# Patient Record
Sex: Male | Born: 1945
Health system: Southern US, Community
[De-identification: ages and names within clinical notes are randomized; demographics above are authoritative.]

## PROBLEM LIST (undated history)

## (undated) DIAGNOSIS — Z8701 Personal history of pneumonia (recurrent): Secondary | ICD-10-CM

## (undated) DIAGNOSIS — I10 Essential (primary) hypertension: Secondary | ICD-10-CM

## (undated) DIAGNOSIS — E785 Hyperlipidemia, unspecified: Secondary | ICD-10-CM

## (undated) DIAGNOSIS — I214 Non-ST elevation (NSTEMI) myocardial infarction: Secondary | ICD-10-CM

## (undated) DIAGNOSIS — E119 Type 2 diabetes mellitus without complications: Secondary | ICD-10-CM

## (undated) DIAGNOSIS — Z87442 Personal history of urinary calculi: Secondary | ICD-10-CM

## (undated) DIAGNOSIS — I2510 Atherosclerotic heart disease of native coronary artery without angina pectoris: Secondary | ICD-10-CM

## (undated) DIAGNOSIS — I219 Acute myocardial infarction, unspecified: Secondary | ICD-10-CM

## (undated) DIAGNOSIS — J189 Pneumonia, unspecified organism: Secondary | ICD-10-CM

## (undated) DIAGNOSIS — K219 Gastro-esophageal reflux disease without esophagitis: Secondary | ICD-10-CM

## (undated) DIAGNOSIS — Z862 Personal history of diseases of the blood and blood-forming organs and certain disorders involving the immune mechanism: Secondary | ICD-10-CM

## (undated) DIAGNOSIS — M1990 Unspecified osteoarthritis, unspecified site: Secondary | ICD-10-CM

## (undated) HISTORY — DX: Personal history of pneumonia (recurrent): Z87.01

## (undated) HISTORY — DX: Type 2 diabetes mellitus without complications: E11.9

## (undated) HISTORY — DX: Essential (primary) hypertension: I10

## (undated) HISTORY — DX: Hyperlipidemia, unspecified: E78.5

## (undated) HISTORY — DX: Atherosclerotic heart disease of native coronary artery without angina pectoris: I25.10

---
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
## 2008-09-20 DIAGNOSIS — I214 Non-ST elevation (NSTEMI) myocardial infarction: Secondary | ICD-10-CM

## 2008-09-20 HISTORY — DX: Non-ST elevation (NSTEMI) myocardial infarction: I21.4

## 2008-09-20 HISTORY — PX: CORONARY ARTERY BYPASS GRAFT: SHX141

## 2009-02-01 ENCOUNTER — Encounter: Payer: Self-pay | Admitting: Cardiology

## 2009-02-01 ENCOUNTER — Ambulatory Visit: Payer: Self-pay | Admitting: *Deleted

## 2009-02-01 ENCOUNTER — Inpatient Hospital Stay (HOSPITAL_COMMUNITY): Admission: EM | Admit: 2009-02-01 | Discharge: 2009-02-07 | Payer: Self-pay | Admitting: *Deleted

## 2009-02-02 ENCOUNTER — Encounter (INDEPENDENT_AMBULATORY_CARE_PROVIDER_SITE_OTHER): Payer: Self-pay | Admitting: *Deleted

## 2009-02-03 ENCOUNTER — Ambulatory Visit: Payer: Self-pay | Admitting: Surgery

## 2009-02-25 ENCOUNTER — Ambulatory Visit: Payer: Self-pay | Admitting: Surgery

## 2009-02-25 ENCOUNTER — Encounter: Admission: RE | Admit: 2009-02-25 | Discharge: 2009-02-25 | Payer: Self-pay | Admitting: Surgery

## 2009-03-03 DIAGNOSIS — M1990 Unspecified osteoarthritis, unspecified site: Secondary | ICD-10-CM | POA: Insufficient documentation

## 2009-03-03 DIAGNOSIS — I2510 Atherosclerotic heart disease of native coronary artery without angina pectoris: Secondary | ICD-10-CM | POA: Insufficient documentation

## 2009-03-03 DIAGNOSIS — F17200 Nicotine dependence, unspecified, uncomplicated: Secondary | ICD-10-CM | POA: Insufficient documentation

## 2009-03-06 ENCOUNTER — Ambulatory Visit: Payer: Self-pay | Admitting: Cardiology

## 2009-03-06 DIAGNOSIS — E785 Hyperlipidemia, unspecified: Secondary | ICD-10-CM | POA: Insufficient documentation

## 2009-03-06 DIAGNOSIS — I10 Essential (primary) hypertension: Secondary | ICD-10-CM | POA: Insufficient documentation

## 2009-03-21 ENCOUNTER — Encounter: Payer: Self-pay | Admitting: Cardiology

## 2009-03-26 ENCOUNTER — Telehealth: Payer: Self-pay | Admitting: Cardiology

## 2009-03-31 ENCOUNTER — Telehealth: Payer: Self-pay | Admitting: Cardiology

## 2009-04-02 ENCOUNTER — Ambulatory Visit: Payer: Self-pay | Admitting: Cardiology

## 2009-04-17 ENCOUNTER — Telehealth: Payer: Self-pay | Admitting: Cardiology

## 2009-04-30 ENCOUNTER — Encounter: Payer: Self-pay | Admitting: Cardiology

## 2009-05-21 ENCOUNTER — Telehealth: Payer: Self-pay | Admitting: Cardiology

## 2009-05-30 ENCOUNTER — Ambulatory Visit: Payer: Self-pay | Admitting: Cardiology

## 2009-06-09 ENCOUNTER — Telehealth: Payer: Self-pay | Admitting: Cardiology

## 2009-06-13 ENCOUNTER — Encounter: Payer: Self-pay | Admitting: Cardiology

## 2009-10-16 ENCOUNTER — Encounter (INDEPENDENT_AMBULATORY_CARE_PROVIDER_SITE_OTHER): Payer: Self-pay | Admitting: *Deleted

## 2009-11-21 ENCOUNTER — Ambulatory Visit: Payer: Self-pay | Admitting: Cardiology

## 2009-12-19 ENCOUNTER — Encounter: Payer: Self-pay | Admitting: Cardiology

## 2010-10-20 NOTE — Assessment & Plan Note (Signed)
Summary: PER CHECK OUT/SF   Primary Provider:  None  CC:  follow up/  Pt has no cardiac concerns at this time.  History of Present Illness: 65 yo with history CAD s/p NSTEMI and CABG returns for followup.  Patient was admitted with NSTEMI in 5/10.  He was noted on cath to have totally occluded RCA with collaterals from the left and 95% proximal LAD at diagonal bifurcation.  He developed chest pain in recovery and was sent for emergent CABG.  He had an uneventful recovery in the hospital after CABG and was discharged home.  He is back at work full time and is active at work.    Patient has been doing well from a cardiovascular standpoint.   He does not get exertional chest pain or shortness of breath.  He does a lot of walking on his job.  He is staying off cigarettes.  Systolic BP at home has been running 120s-130s.   Current Medications (verified): 1)  Aspirin 325 Mg Tabs (Aspirin) .... Once Daily 2)  Metoprolol Tartrate 25 Mg Tabs (Metoprolol Tartrate) .... (1/2) Two Times A Day 3)  Pravachol 40 Mg Tabs (Pravastatin Sodium) .... Take 1 Tablet Two Times A Day 4)  Lisinopril 20 Mg Tabs (Lisinopril) .... Take One Tablet Two Times A Day 5)  Viagra 50 Mg Tabs (Sildenafil Citrate) .... As Directed 6)  Hydrochlorothiazide 12.5 Mg Caps (Hydrochlorothiazide) .... Take One Capsule Once Daily  Allergies (verified): 1)  ! Pcn  Past History:  Past Medical History: Reviewed history from 05/30/2009 and no changes required. 1.  CAD:  NSTEMI 5/10, LHC showed totally occluded RCA with collaterals from the left, 95% LAD stenosis at D1 bifurcation.  Patient had CABG with LIMA-LAD, SVG-D, and seq SVG-PDA and PLV.  2.  Echo (5/10): EF 60-65%, moderate LVH, no significant valvular abnormalities.  3.  Prior smoker (quit 5/10) 4.  Osteoarthritis 5.  HTN 6.  Hyperlipidemia: myalgias with Crestor and Zocor  Family History: Reviewed history from 03/03/2009 and no changes required. His mother is living at  94 years old His father had a myocardial infarction at age 58 but died at age 34 He has a sister who  is well.   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 Married-3 children Tobacco Use - Prior. 1 to 2 ppd, quit 5/10.  Alcohol Use - yes-occasional, mainly on weekends Drug Use - no  Review of Systems       All systems reviewed and negative except as per HPI.   Vital Signs:  Patient profile:   65 year old male Height:      69 inches Weight:      198 pounds BMI:     29.35 Pulse rate:   53 / minute Pulse rhythm:   regular BP sitting:   130 / 78  (left arm) Cuff size:   large  Vitals Entered By: Judithe Modest CMA (November 21, 2009 1:33 PM)  Physical Exam  General:  Well developed, well nourished, in no acute distress. Neck:  Neck supple, no JVD. No masses, thyromegaly or abnormal cervical nodes. Lungs:  Clear bilaterally to auscultation and percussion. Heart:  Non-displaced PMI, chest non-tender; regular rate and rhythm, S1, S2 without murmurs, rubs or gallops. Carotid upstroke normal, no bruit. Pedals normal pulses. No edema, no varicosities. Abdomen:  Bowel sounds positive; abdomen soft and non-tender without masses, organomegaly, or hernias noted. No hepatosplenomegaly. Extremities:  No clubbing  or cyanosis. Neurologic:  Alert and oriented x 3. Psych:  Normal affect.   Impression & Recommendations:  Problem # 1:  CAD (ICD-414.00) Doing well post-CABG with no chest pain.  Continue current meds: ASA, lopressor, ACEI, statin.  Continue to try to walk for exercise.   Problem # 2:  HYPERTENSION, UNSPECIFIED (ICD-401.9) BP is at goal.   Problem # 3:  HYPERLIPIDEMIA-MIXED (ICD-272.4) Check lipids/LFTs today, goal LDL < 70.   Other Orders: EKG w/ Interpretation (93000)  Patient Instructions: 1)  Your physician recommends that you have  a FASTING lipid profile/liver profile/BMP --you have the order to have this done at  the [facility] ask them to fax the results to Dr Shirlee Latch [phone]  2)  Your physician wants you to follow-up in: 6 months with Dr Shirlee Latch.  You will receive a reminder letter in the mail two months in advance. If you don't receive a letter, please call our office to schedule the follow-up appointment.

## 2010-10-20 NOTE — Letter (Signed)
Summary: Appointment - Reminder 2  [facility], Main Office  [address]   [place], [place] [address]   Phone: [phone]  Fax: [phone]     October 16, 2009 MRN: [id number]   ANITA MCADORY [address] [facility] [address], [place]  [address]   Dear Mr. ASHWORTH,  Our records indicate that it is time to schedule a follow-up appointment with Dr. Shirlee Latch. It is very important that we reach you to schedule this appointment. We look forward to participating in your health care needs. Please contact us at the number listed above at your earliest convenience to schedule your appointment.  If you are unable to make an appointment at this time, give [place] a call so we can update our records.     Sincerely,   Glass blower/designer

## 2010-12-29 LAB — POCT I-STAT 4, (NA,K, GLUC, HGB,HCT)
Glucose, Bld: 84 mg/dL (ref 70–99)
Glucose, Bld: 87 mg/dL (ref 70–99)
Glucose, Bld: 93 mg/dL (ref 70–99)
HCT: 23 % — ABNORMAL LOW (ref 39.0–52.0)
HCT: 27 % — ABNORMAL LOW (ref 39.0–52.0)
HCT: 28 % — ABNORMAL LOW (ref 39.0–52.0)
HCT: 33 % — ABNORMAL LOW (ref 39.0–52.0)
Hemoglobin: 11.2 g/dL — ABNORMAL LOW (ref 13.0–17.0)
Hemoglobin: 11.9 g/dL — ABNORMAL LOW (ref 13.0–17.0)
Hemoglobin: 12.6 g/dL — ABNORMAL LOW (ref 13.0–17.0)
Hemoglobin: 7.8 g/dL — CL (ref 13.0–17.0)
Hemoglobin: 9.2 g/dL — ABNORMAL LOW (ref 13.0–17.0)
Potassium: 3.6 mEq/L (ref 3.5–5.1)
Potassium: 4.1 mEq/L (ref 3.5–5.1)
Potassium: 4.6 mEq/L (ref 3.5–5.1)
Potassium: 5 mEq/L (ref 3.5–5.1)
Sodium: 135 mEq/L (ref 135–145)
Sodium: 138 mEq/L (ref 135–145)
Sodium: 139 mEq/L (ref 135–145)
Sodium: 140 mEq/L (ref 135–145)

## 2010-12-29 LAB — CBC
HCT: 30.2 % — ABNORMAL LOW (ref 39.0–52.0)
HCT: 30.8 % — ABNORMAL LOW (ref 39.0–52.0)
Hemoglobin: 10.6 g/dL — ABNORMAL LOW (ref 13.0–17.0)
Hemoglobin: 10.7 g/dL — ABNORMAL LOW (ref 13.0–17.0)
Hemoglobin: 10.8 g/dL — ABNORMAL LOW (ref 13.0–17.0)
Hemoglobin: 11.2 g/dL — ABNORMAL LOW (ref 13.0–17.0)
Hemoglobin: 13.2 g/dL (ref 13.0–17.0)
MCHC: 34.4 g/dL (ref 30.0–36.0)
MCHC: 34.8 g/dL (ref 30.0–36.0)
MCHC: 34.8 g/dL (ref 30.0–36.0)
MCV: 96.7 fL (ref 78.0–100.0)
Platelets: 105 10*3/uL — ABNORMAL LOW (ref 150–400)
Platelets: 185 10*3/uL (ref 150–400)
RBC: 3.15 MIL/uL — ABNORMAL LOW (ref 4.22–5.81)
RBC: 3.16 MIL/uL — ABNORMAL LOW (ref 4.22–5.81)
RBC: 3.18 MIL/uL — ABNORMAL LOW (ref 4.22–5.81)
RBC: 3.34 MIL/uL — ABNORMAL LOW (ref 4.22–5.81)
RBC: 3.96 MIL/uL — ABNORMAL LOW (ref 4.22–5.81)
RDW: 12.9 % (ref 11.5–15.5)
RDW: 13.1 % (ref 11.5–15.5)
RDW: 13.1 % (ref 11.5–15.5)
RDW: 13.4 % (ref 11.5–15.5)
WBC: 11.2 10*3/uL — ABNORMAL HIGH (ref 4.0–10.5)
WBC: 8.5 10*3/uL (ref 4.0–10.5)
WBC: 9.1 10*3/uL (ref 4.0–10.5)
WBC: 9.5 10*3/uL (ref 4.0–10.5)

## 2010-12-29 LAB — POCT I-STAT, CHEM 8
Creatinine, Ser: 1.1 mg/dL (ref 0.4–1.5)
Glucose, Bld: 112 mg/dL — ABNORMAL HIGH (ref 70–99)
Hemoglobin: 10.5 g/dL — ABNORMAL LOW (ref 13.0–17.0)
Potassium: 3.9 mEq/L (ref 3.5–5.1)

## 2010-12-29 LAB — BASIC METABOLIC PANEL
BUN: 10 mg/dL (ref 6–23)
BUN: 10 mg/dL (ref 6–23)
CO2: 25 mEq/L (ref 19–32)
CO2: 29 mEq/L (ref 19–32)
Calcium: 7.8 mg/dL — ABNORMAL LOW (ref 8.4–10.5)
Calcium: 8.7 mg/dL (ref 8.4–10.5)
Chloride: 108 mEq/L (ref 96–112)
Chloride: 109 mEq/L (ref 96–112)
GFR calc Af Amer: >60 mL/min (ref >60)
GFR calc Af Amer: >60 mL/min (ref >60)
GFR calc non Af Amer: >60 mL/min (ref >60)
GFR calc non Af Amer: >60 mL/min (ref >60)
GFR calc non Af Amer: >60 mL/min (ref >60)
Glucose, Bld: 101 mg/dL — ABNORMAL HIGH (ref 70–99)
Glucose, Bld: 122 mg/dL — ABNORMAL HIGH (ref 70–99)
Glucose, Bld: 169 mg/dL — ABNORMAL HIGH (ref 70–99)
Potassium: 3.6 mEq/L (ref 3.5–5.1)
Potassium: 4 mEq/L (ref 3.5–5.1)
Potassium: 4.1 mEq/L (ref 3.5–5.1)
Potassium: 4.3 mEq/L (ref 3.5–5.1)
Sodium: 138 mEq/L (ref 135–145)
Sodium: 140 mEq/L (ref 135–145)
Sodium: 141 mEq/L (ref 135–145)
Sodium: 141 mEq/L (ref 135–145)

## 2010-12-29 LAB — CARDIAC PANEL(CRET KIN+CKTOT+MB+TROPI)
CK, MB: 98 ng/mL — ABNORMAL HIGH (ref 0.3–4.0)
CK, MB: 67.1 ng/mL — ABNORMAL HIGH (ref 0.3–4.0)
Relative Index: 14.4 — ABNORMAL HIGH (ref 0.0–2.5)
Total CK: 679 U/L — ABNORMAL HIGH (ref 7–232)
Total CK: 508 U/L — ABNORMAL HIGH (ref 7–232)
Troponin I: 3.64 ng/mL (ref 0.00–0.06)

## 2010-12-29 LAB — LIPID PANEL
HDL: 55 mg/dL (ref >39)
LDL Cholesterol: 126 mg/dL — ABNORMAL HIGH (ref 0–99)
Triglycerides: 76 mg/dL (ref <150)
VLDL: 15 mg/dL (ref 0–40)

## 2010-12-29 LAB — POCT I-STAT 7, (LYTES, BLD GAS, ICA,H+H)
Acid-base deficit: 2 mmol/L (ref 0.0–2.0)
Bicarbonate: 24 mEq/L (ref 20.0–24.0)
Sodium: 140 mEq/L (ref 135–145)
TCO2: 25 mmol/L (ref 0–100)

## 2010-12-29 LAB — HEMOGLOBIN A1C
Hgb A1c MFr Bld: 5.3 % (ref 4.6–6.1)
Mean Plasma Glucose: 105 mg/dL

## 2010-12-29 LAB — CROSSMATCH: ABO/RH(D): O POS

## 2010-12-29 LAB — POCT I-STAT 3, ART BLOOD GAS (G3+)
Acid-Base Excess: 1 mmol/L (ref 0.0–2.0)
O2 Saturation: 94 %
TCO2: 26 mmol/L (ref 0–100)
pCO2 arterial: 34.6 mmHg — ABNORMAL LOW (ref 35.0–45.0)
pCO2 arterial: 40.3 mmHg (ref 35.0–45.0)
pH, Arterial: 7.416 (ref 7.350–7.450)
pO2, Arterial: 286 mmHg — ABNORMAL HIGH (ref 80.0–100.0)

## 2010-12-29 LAB — ABO/RH: ABO/RH(D): O POS

## 2010-12-29 LAB — APTT
aPTT: 110 seconds — ABNORMAL HIGH (ref 24–37)
aPTT: 33 seconds (ref 24–37)
aPTT: 36 seconds (ref 24–37)
aPTT: 39 seconds — ABNORMAL HIGH (ref 24–37)

## 2010-12-29 LAB — GLUCOSE, CAPILLARY
Glucose-Capillary: 104 mg/dL — ABNORMAL HIGH (ref 70–99)
Glucose-Capillary: 116 mg/dL — ABNORMAL HIGH (ref 70–99)
Glucose-Capillary: 118 mg/dL — ABNORMAL HIGH (ref 70–99)
Glucose-Capillary: 87 mg/dL (ref 70–99)
Glucose-Capillary: 93 mg/dL (ref 70–99)
Glucose-Capillary: 97 mg/dL (ref 70–99)

## 2010-12-29 LAB — HEPARIN LEVEL (UNFRACTIONATED)
Heparin Unfractionated: 0.48 IU/mL (ref 0.30–0.70)
Heparin Unfractionated: 0.58 IU/mL (ref 0.30–0.70)
Heparin Unfractionated: <0.1 IU/mL — ABNORMAL LOW (ref 0.30–0.70)

## 2010-12-29 LAB — HEMOGLOBIN AND HEMATOCRIT, BLOOD: Hemoglobin: 9.3 g/dL — ABNORMAL LOW (ref 13.0–17.0)

## 2010-12-29 LAB — CREATININE, SERUM: Creatinine, Ser: 0.86 mg/dL (ref 0.4–1.5)

## 2010-12-29 LAB — PROTIME-INR
INR: 1 (ref 0.00–1.49)
Prothrombin Time: 13.5 seconds (ref 11.6–15.2)
Prothrombin Time: 16.3 seconds — ABNORMAL HIGH (ref 11.6–15.2)
Prothrombin Time: 17.4 seconds — ABNORMAL HIGH (ref 11.6–15.2)

## 2010-12-29 LAB — MAGNESIUM
Magnesium: 2.2 mg/dL (ref 1.5–2.5)
Magnesium: 2.3 mg/dL (ref 1.5–2.5)
Magnesium: 2.4 mg/dL (ref 1.5–2.5)

## 2011-02-02 NOTE — H&P (Signed)
NAMEJIBRI, Derek [name] NO.:  [id number]   MEDICAL RECORD NO.:  [id number]          PATIENT TYPE:  INP   LOCATION:  2915                         FACILITY:  [facility]   PHYSICIAN:  Adela Ports, MD   DATE OF BIRTH:  [birth year]   DATE OF ADMISSION:  02/01/2009  DATE OF DISCHARGE:                              HISTORY & PHYSICAL   PRIMARY MEDICAL DOCTOR:  None.   PRIMARY CARDIOLOGIST:  None.   CHIEF COMPLAINT:  Chest pain.   HISTORY OF PRESENT ILLNESS:  Derek [name] is a 65 year old male with no  significant past medical history and no current medical followup.  He is  an active smoker.   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  He found that while  working on a car doing some fairly active work he had episodes of chest  tightness that was relieved with rest 3 times.  He described these as a  feeling of pressure in the substernal chest that was moderate in  severity.  He went home and relaxed and went to bed and woke at about 10  p.m. with recurrence of his chest pain that was more severe and lasted  to approximately 3 a.m.  At that time he took some Pepto-Bismol and  symptoms resolved after about 30 minutes.  He went back to sleep and  then this morning when he woke up he got breakfast and subsequently had  recurrence of his chest pain again.  This was substernal chest pain with  radiation to the left shoulder and mild radiation to the right shoulder  described as a squeezing sensation that was 8/10 in severity.  After  approximately 3 hours of this, he had his wife check his blood pressure  and the systolic pressure was 224.  [facility] the [facility] and they arrived and checked an EKG, told him that it was not  too concerning, but that he should probably go to the hospital.  He  opted to wait, but when the pain continued he had his wife drive him to  the hospital  at [facility].  He denies any headache, dizziness, nausea,  vomiting, claudication, cough, fevers, chills, polyuria, polydipsia,  weight change, orthopnea, PND, edema, calf redness or swelling, or  hemoptysis.   At [facility] his blood pressure was  210/100.  He was treated with nitroglycerin and morphine with some  relief of his pain.  D-dimer was positive, so he had a chest CT that was  negative for pulmonary embolism.  Troponin came back at 0.25, with upper  limit of normal being 0.03.  He was started on heparin and transferred  to [facility] for admission and further evaluation.   PAST MEDICAL HISTORY:  Osteoarthritis and history of pilonidal cyst  removal as a teenager.   SOCIAL HISTORY:  The patient lives with his wife and works as an Psychologist, prison and probation services.  He has 3 children.  He smokes 1-2 packs per day and has done  so for many years, though he has also quit for extended periods in the  past.  He drinks alcohol occasionally on weekends but does not use  drugs.   FAMILY HISTORY:  His mother is living at 47 years old.  His father had a  myocardial infarction at age 2 but died at age 35.  He has a sister who  is well.   REVIEW OF SYSTEMS:  He did have a flu-like illness approximately 3-4  weeks ago that was self limited.  He denies any other findings other  than mentioned above.  He is full code.   ALLERGIES:  PENICILLIN caused a questionable reaction as a child.   MEDICATIONS:  Aspirin 325 mg daily, ibuprofen as needed for arthritis  pain.   PHYSICAL EXAMINATION:  Temperature 98.5, pulse 64, blood pressure was  163/84, respiratory rate 19.  Weight is approximately 180 pounds, height  5 feet 9.  Oxygen saturation 100% on 4 liters.  GENERAL:  This is a pleasant well-appearing man in no acute distress.  HEENT:  Normocephalic, atraumatic.  Pupils equal, round and reactive.  Extraocular movements intact.  Mucous membranes are moist.  NECK:  Supple without  lymphadenopathy or thyromegaly.  Carotids are 2+  without bruits.  Jugular venous pressure is 7 cm.  CARDIOVASCULAR EXAM:  There is a regular rate and rhythm with normal S1-  S2, no S3 or S4 gallops are appreciated.  There is a very soft 1/6  systolic ejection murmur and a very soft 1/6 systolic murmur at the  apex.  Pulses are 2+ in the femoral and DP and PT positions bilaterally  without bruits.  LUNGS:  Clear to auscultation bilaterally.  SKIN:  No rash or lesions, though there are some changes consistent with  chronic sun exposure on the upper back.  ABDOMEN:  Soft, nontender, nondistended without rebound or guarding.  Positive bowel sounds.  No hepatosplenomegaly.  EXTREMITIES:  No clubbing, cyanosis or edema.  MUSCULOSKELETAL:  Grossly normal.  NEURO:  Grossly normal.   RADIOLOGY:  CT of the chest at outside hospital showed no evidence of  pulmonary embolism.  EKG at 3:46 p.m. at outside hospital showed sinus  rhythm at 80 beats per minute with PVCs, with voltage consistent with  LVH and lateral ST depressions consistent with repolarization.  At that  time blood pressure was 210/100 and chest pain was 5/10.  Second EKG at  5:09 p.m. showed sinus rhythm at 61 beats per minute with normalization  of his voltage and normalization of lateral ST-T changes with persistent  nonspecific ST-T changes in the inferior leads.  At that time blood  pressure was lower at 139/78 and he was chest pain free.   LABORATORIES:  Notable for a white count of 12.8, hematocrit 47.6,  platelets 208.  Potassium is 4.2, creatinine 0.85, glucose 129.  D-dimer  was 0.57, INR 0.9, CK 103 with MB fraction 8.3, troponin-I was 0.25 on  the first set.   ASSESSMENT AND PLAN:  Derek [name] presents with chest pain and positive  troponin consistent with non-ST elevation myocardial infarction.  This  could be in the setting of an acute coronary syndrome versus  hypertensive emergency or both.  He is currently pain  free on  nitroglycerin and heparin after receiving morphine and sublingual  nitroglycerin as well.   PLAN:  Will be admission for further observation and management.  We  will cycle his cardiac enzymes.  We will continue treatment with heparin  and load Plavix.  We will continue aspirin.  Will initiate beta blocker  and ACE inhibitor therapy.  We will start a high-dose statin.  We will  continue nitroglycerin drip.  We will check a lipid panel and hemoglobin  A1c and TSH.  We will also obtain an echocardiogram.  Blood pressure  goal will be less than 160 systolic for now, given his very high  presenting blood pressure, with a goal of systolic 120 tomorrow.  We  will plan  for cardiac catheterization on Monday for further evaluation and  possible therapy.  We have discussed tobacco cessation and Mr. Fancher  agrees that he is now a former smoker.  He says that he has quit in the  past and feels he can do this without pharmacologic assistance, though  he knows that this is available if it would help.      Adela Ports, MD  Electronically Signed     Adela Ports, MD  Electronically Signed    DWM/MEDQ  D:  02/01/2009  T:  02/01/2009  Job:  [id number]

## 2011-02-02 NOTE — Discharge Summary (Signed)
Derek [name], Derek [name]             ACCOUNT NO.:  [id number]   MEDICAL RECORD NO.:  [id number]          PATIENT TYPE:  INP   LOCATION:  2008                         FACILITY:  [facility]   PHYSICIAN:  Evelene Croon, M.D.     DATE OF BIRTH:  03-Jul-1946   DATE OF ADMISSION:  02/01/2009  DATE OF DISCHARGE:  02/07/2009                               DISCHARGE SUMMARY   ADMITTING DIAGNOSES:  1. Non-ST elevation myocardial infarction.  2. Tobacco abuse.   DISCHARGE DIAGNOSES:  1. Non-ST elevation myocardial infarction.  2. Tobacco abuse.  3. Postoperative thrombocytopenia.   PROCEDURES:  1. Echocardiogram done Feb 02, 2009, results revealed no significant      valvular disease.  Estimated ejection fraction was 60-65%.  2. Cardiac catheterization performed on Feb 03, 2009 by Dr. Excell Seltzer.  3. Emergent coronary artery bypass graft x4 (left internal mammary      artery to left anterior descending, saphenous vein graft to      diagonal branch, saphenous vein graft to posterior descending      artery and posterolateral branches of the right coronary artery      with endoscopic vein harvesting of the right leg by Dr. Laneta Simmers on      Feb 03, 2009).   HISTORY OF PRESENT ILLNESS:  This is a 65 year old Caucasian male with  history of tobacco abuse who had no prior history of coronary artery  disease.  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  While he was doing so strenuous work  on a car, he had several episodes of chest discomfort.  This chest  discomfort happened on three different occasions and each time was  relieved with rest.  He then went home and relaxed.  He was awakened at  10:00 p.m. with recurrence of chest pain that was more severe than the  previous episodes.  The chest pain lasted until about 3:00 a.m.  He took  some antacids and this chest pain apparently resolved after  approximately 30 minutes.  He then awoke the morning of Feb 01, 2009 and  did not have any chest pain until after he was done eating breakfast.  The chest pain then radiated to his left shoulder as well as some mild  radiation to his right shoulder.  The patient's wife took his blood  pressure which apparently was very elevated.  EMS was called and an EKG  apparently did not show any worrisome ischemic changes.  The patient  decided not to go to the hospital, however, later his chest pain  recurred and his wife took him to [facility] for further  evaluation and treatment.  EKG done in the emergency room showed some  inferior ST and T-wave changes, his troponin level was 0.25, CPK 103,  and CPK-MB of 8.3.  The patient was found to have an elevated D-dimer  level during workup and a CT scan of the chest was negative for  pulmonary embolism.  The patient was then transferred to [facility] for  further  workup regarding non-STEMI.  He was loaded with Plavix and  started on aspirin and beta-blockers.  Cardiac catheterization was done  on Feb 03, 2009 by Dr. Excell Seltzer which showed an occluded proximal RCA with  filling of the distal vessel by collaterals from the LAD.  The LAD and  diagonal had 95% bifurcation stenoses.  The left circumflex had an  approximate 50% proximal stenosis and the EF was 45-50% with inferior  wall akinesis.  The patient did have some mild substernal chest pain  following the cardiac catheterization.  He was given morphine with  resolution of his chest pain.  A consultation was obtained with Dr.  Laneta Simmers and it was decided the patient needed to undergo emergent CABG  which was performed on Feb 03, 2009.   HOSPITAL COURSE STAY:  The patient was extubated late the evening of  surgery.  He remained afebrile and hemodynamically stable.  A-line Swan  and chest tubes were removed on postoperative day #1.  He was still on  phenylephrine, this was weaned.  He was also found to have postoperative  thrombocytopenia.  This was monitored  and his last platelet count was  120,000 on Feb 05, 2009.  The patient was found to be volume overloaded  and diuresed accordingly.  He was transferred from the Intensive Care  Unit to PCTU for further convalescence.  He continued to progress with  cardiac rehab.  Currently on postop day #3, he is tolerating a diet.  He  has had a bowel movement.  He is afebrile.  Vital signs are stable.  His  O2 sat is 91% on room air.  His preop weight is 82 kg, today's weight is  down to 87.6 kg.  On physical exam, cardiovascular has regular rate and  rhythm.  Lungs are clear to auscultation bilaterally.  Extremities,  middle lower extremity edema.  Incisions are healing well.  Provided the  patient remained afebrile and hemodynamically stable, he will be  discharged on Feb 07, 2009.   LATEST LABORATORY STUDIES:  CBC still in progress today so far shows H  and H to be 10.8 and 30.5 respectively.  White count 9500 and platelet  count is pending.  Last BMET done on Feb 05, 2009 showed potassium 3.16,  BUN and creatinine to be 10 and 0.3 respectively.  Last chest x-ray done  today, Feb 06, 2009, shows mild improvement in aeration, no pulmonary  edema, small left pleural effusion with left basilar atelectasis.   DISCHARGE INSTRUCTIONS:  The patient is to remain on a low-fat, low-salt  diet.  He is not to lift or drive more than 10 pounds and he is to  continue with his breathing exercises daily.  He is to walk every day  and increase his frequency and duration as he tolerates.  He may shower.  He is to cleanse his wounds with soap and water.  He is to call the  office if any wound problems arise.  The patient was counseled on the  importance of smoking cessation.  He was given a #10 roll in a smoking  cessation class.   FOLLOWUP APPOINTMENTS:  1. The patient is to call [facility]'s office to follow up in      2 weeks (Dr. Excell Seltzer).  2. The patient has an appointment to see Dr. Laneta Simmers on February 25, 2009 at      12:00 p.m.  Prior to this office appointment, a chest x-ray will be  obtained.   DISCHARGE MEDICATIONS:  1. Enteric-coated aspirin 325 mg p.o. daily.  2. Lopressor 25 mg p.o. 2 times daily.  3. Crestor 40 mg p.o. at bedtime.  4. Lasix 40 mg p.o. daily for 5 days.  5. KCl 20 mEq p.o. daily for 5 days.  6. Ultram 50 mg 1-2 tablets every 4-6 as needed for pain.      Doree Fudge, [place]      Evelene Croon, M.D.  Electronically Signed    DZ/MEDQ  D:  02/06/2009  T:  02/07/2009  Job:  [id number]   cc:   Veverly Fells. Excell Seltzer, MD

## 2011-02-02 NOTE — Op Note (Signed)
Derek [name], Derek [name]             ACCOUNT NO.:  [id number]   MEDICAL RECORD NO.:  [id number]          PATIENT TYPE:  INP   LOCATION:  2304                         FACILITY:  [facility]   PHYSICIAN:  Evelene Croon, M.D.     DATE OF BIRTH:  1945/12/09   DATE OF PROCEDURE:  02/03/2009  DATE OF DISCHARGE:                               OPERATIVE REPORT   PREOPERATIVE DIAGNOSIS:  Severe two-vessel coronary artery disease  status post non-ST-segment elevation myocardial infarction.   POSTOPERATIVE DIAGNOSIS:  Severe two-vessel coronary artery disease  status post non-ST-segment elevation myocardial infarction.   OPERATIVE PROCEDURE:  Emergency median sternotomy, extracorporeal  circulation, coronary bypass graft surgery x4 using a left internal  mammary artery graft to the left anterior descending coronary artery,  with a saphenous vein graft to diagonal branch of the left anterior  descending, and a sequential saphenous vein graft to posterior  descending and posterolateral branches of the right coronary artery.  Endoscopic vein harvesting from the right leg.   SURGEON:  Evelene Croon, MD   ASSISTANT:  Coral Ceo, [facility]   ANESTHESIA:  General endotracheal.   CLINICAL HISTORY:  This patient is a 65 year old heavy smoker with no  prior cardiac history who was admitted on Saturday with a stuttering MI  over the previous 24 hours.  He ruled in for non-ST-segment elevation MI  at [facility] and was transferred to [facility].  He had some  mild chest discomfort yesterday and underwent cardiac catheterization  this morning showing three-vessel coronary disease with complete  occlusion of the proximal right coronary artery, 95% bifurcation  stenosis of the LAD and diagonal proximally, and about 50% proximal left  circumflex stenosis.  The left circumflex lesion was not flow limiting.  The LAD was large vessel that wrapped the apex and supplied collaterals  to the posterior descending and  posterolateral branches.  The patient  had some pain in the cardiac cath lab holding area after the procedure  which was relieved with morphine sulfate.  I was asked to see the  patient there and after reviewing his catheterization and examining the  patient, I felt that proceeding ahead with emergent coronary bypass  surgery is best treatment to prevent further ischemia and infarction.  I  discussed the operative procedure with the patient and his wife  including alternatives, benefits, and risks including but not limited to  bleeding, blood transfusion, infection, stroke, myocardial infarction,  graft failure, and death.  He understood and agreed to proceed.   OPERATIVE PROCEDURE:  The patient was taken to the operating room in a  hemodynamically stable condition.  He still had a sheath in the right  groin from cardiac catheterization.  After induction of general  endotracheal anesthesia, a Foley catheter was placed in the bladder  using sterile technique.  Then the chest, abdomen, and both lower  extremities were prepped and draped in usual sterile manner.  The chest  was entered through a median sternotomy incision and the pericardium  opened midline.  Examination of the heart showed good ventricular  contractility.  The ascending aorta had no palpable  plaques in it.   Then the left internal mammary artery was harvested from the chest wall  as a pedicle graft.  This was a medium caliber vessel with excellent  blood flow through it.  At the same time, a segment of greater saphenous  vein was harvested from the right thigh using endoscopic vein harvest  technique.  This vein was of medium size and good quality.   Then the patient was heparinized when an adequate activated clotting  time was achieved.  The distal ascending aorta was cannulated using a 20-  French aortic cannula for arterial inflow.  Venous outflow was achieved  using a two-stage venous cannula through the right atrial  appendage.  An  antegrade cardioplegia and vent cannula was inserted in aortic root.   The patient was placed on cardiopulmonary bypass and distal coronaries  identified.  The LAD was a large vessel that was diseased proximally but  had no distal disease in it.  The first diagonal branch was a moderate-  sized vessel that was diseased proximally but had no distal disease in  it.  The left circumflex had a single large marginal branch that had no  significant disease in it.  The right coronary artery was diffusely  diseased with plaque.  This extended down to the takeoff of the  posterior descending branch.  The posterior descending itself was fairly  soft proximally but has significant distal disease in it.  The  posterolateral branch had no significant disease in it.  It was a  moderate-sized vessel before bifurcated into two smaller branches.   Examination of the inferior wall of the heart showed the previous  infarction with some scar present.   Then the aorta was crossclamped and 500 mL of cold blood antegrade  cardioplegia was administered in the aortic root with quick arrest of  the heart.  Systemic hypothermia to 20 degrees centigrade and topical  hypothermia with iced saline was used.  A temperature probe was placed  in the septum and insulating pad in the pericardium.   The first distal anastomosis was performed to the posterior descending  coronary artery.  The internal diameter was about 1.6 mm.  The conduit  used was a segment of greater saphenous vein.  The anastomosis was  performed in a sequential side-to-side manner using continuous 7-0  Prolene suture.  Flow was noted through the graft and was excellent.   The second distal anastomosis was performed to the posterolateral branch  of the right coronary artery.  The internal diameter of this vessel was  also about 1.6 mm.  The conduit used was a same segment of greater  saphenous vein and the anastomosis was performed  in a sequential end-to-  side manner using continuous 7-0 Prolene suture.  Flow was noted through  the graft and was excellent.  Then an another dose of cardioplegia was  given down vein graft and the aortic root.   The third distal anastomosis was performed to the diagonal branch.  The  internal diameter was 1.6 mm.  The conduit used was a second segment of  greater saphenous vein.  The anastomosis was performed in a end-to-side  manner using continuous 7-0 Prolene suture.  Flow was noted through the  graft and was excellent.   Fourth distal anastomosis was performed to the mid LAD.  The internal  diameter was about 2.5 mm.  The conduit used was a left internal mammary  graft and was brought through an opening  left pericardium anterior to  the phrenic nerve.  It was anastomosed to the LAD in an end-to-side  manner using continuous 8-0 Prolene suture.  The pedicle was sutured to  the epicardium with 6-0 Prolene sutures.  The patient was then rewarmed  to 37 degrees centigrade.  Another dose of cardioplegia was given.  With  the crossclamp in place, two proximal vein graft anastomoses were  performed to the ascending aorta in an end-to-side manner using  continuous 6-0 Prolene suture.  Then the clamp was removed from the  mammary pedicle.  There was rapid warming of the ventricular septum and  return of spontaneous ventricular fibrillation.  The crossclamp was  removed with time of 69 minutes and the patient defibrillated into sinus  rhythm.  The proximal and distal anastomoses appeared hemostatic while  the grafts were satisfactory.  Graft markers were placed around the  proximal anastomoses.  Two temporary right ventricular and right atrial  pacing wires were placed and brought out through the skin.   When the patient had rewarmed to 37 degrees centigrade, he was weaned  from cardiopulmonary bypass on no inotropic agents.  Total bypass time  was 83 minutes.  The cardiac function  appeared excellent.  The cardiac  output was 6 L/min.  Protamine was given and venous and aortic cannulae  were removed without difficulty.  Hemostasis was achieved.  Three chest  tubes were placed with two in the posterior pericardium, one in the left  pleural space, and one in the anterior mediastinum.  The pericardium was  reapproximated over the heart.  The sternum was closed with #6 stainless  steel wires.  The fascia was closed with continuous #1 Vicryl suture.  Subcutaneous tissue was closed with continuous 2-0 Vicryl and the skin  with a 3-0 Vicryl subcuticular closure.  The lower extremity vein  harvest site was closed in layers in similar manner.  Sponge, needle,  and instrument counts were correct according to the scrub nurse.  Dry  sterile dressings were applied over the incisions around the chest tubes  which were Pleur-Evac suction.  The patient remained hemodynamically  stable and was transferred to the SICU in guarded but stable condition.      Evelene Croon, M.D.  Electronically Signed     BB/MEDQ  D:  02/03/2009  T:  02/04/2009  Job:  [id number]   cc:   [facility] Card[facility]

## 2011-02-02 NOTE — Assessment & Plan Note (Signed)
OFFICE VISIT   Derek [name], Derek [name]  DOB:  [birth year]                                        February 25, 2009  CHART #:  [id number]   The patient returned today for followup status post emergent coronary  artery bypass graft surgery x4 on 02/03/2009.  He has not seen  Cardiology back yet, but does have an appointment to see Dr. Marca Ancona in the near future.  He has been walking daily without chest pain  or shortness of breath.  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   PHYSICAL EXAMINATION:  VITAL SIGNS:  Today, blood pressure is 134/79,  his pulse is 57 and regular, his respiratory rate is 18, and unlabored.  Oxygen saturation on room is 97%.  GENERAL:  He looks well.  CARDIAC:  Regular rate and rhythm with normal heart sounds.  LUNGS:  Clear.  CHEST:  Incision is healing well and the sternum is stable.  EXTREMITIES:  His right leg incision is healing well.  There is no  peripheral edema.   Followup chest x-ray today shows clear lung fields and no pleural  effusions.   MEDICATIONS:  1. Aspirin 325 mg daily.  2. Lopressor 25 mg b.i.d.  3. Crestor 40 mg at bedtime.  4. Ultram p.r.n. for pain.   IMPRESSION:  Overall, the patient is recovering well following his  surgery.  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  I told him he can  return to his work as long as he avoids lifting anything  heavier than 10 pounds for a total of 3 months from date of surgery.  He  will follow up with Cardiology and will contact me if he develops any  problems with his incision.   Evelene Croon, M.D.  Electronically Signed   BB/MEDQ  D:  02/25/2009  T:  02/25/2009  Job:  [id number]

## 2011-02-02 NOTE — Consult Note (Signed)
NAMEMARKISE, [name]             ACCOUNT NO.:  [id number]   MEDICAL RECORD NO.:  [id number]          PATIENT TYPE:  INP   LOCATION:  2304                         FACILITY:  [facility]   PHYSICIAN:  Evelene Croon, M.D.     DATE OF BIRTH:  17-Mar-1946   DATE OF CONSULTATION:  02/03/2009  DATE OF DISCHARGE:                                 CONSULTATION   REFERRING PHYSICIAN:  Veverly Fells. Excell Seltzer, MD   REASON FOR CONSULTATION:  Severe 2-vessel coronary artery disease,  status post inferior non-ST segment elevation MI.   CLINICAL HISTORY:  I was asked by Dr. Excell Seltzer to evaluate Derek [name] in  the cath lab holding area for consideration of urgent coronary artery  bypass graft surgery.  He is a 65 year old white male, smoker, with no  prior history of coronary artery disease who has been relatively  healthy.  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  While he was  performing some strenuous work on a car, he had several episodes of  chest discomfort that was relieved with rest on 3 different occasions.  He went home and relaxed and went to bed and woke up about 10 p.m. with  recurrence of the chest pain that was more severe and lasted to about 3  a.m.  He took some antacids and symptoms resolved after about 30  minutes.  He went back to sleep and woke up the morning of Feb 01, 2009,  without pain, but then developed recurrent chest pain after eating  breakfast.  He had radiation to the left shoulder as well as mild  radiation to the right shoulder.  After about 3 hours of this, he had  his wife check his blood pressure, which was markedly elevated.  He  called EMS and they arrived and checked an EKG that did not show any  worrisome ischemic changes according to the patient.  He desired to  wait, but then his pain recurred and his wife took him to [facility].  On arrival there, his troponin was 0.25 with a CPK of 103 and  MB  of 8.3.  Electrocardiogram showed some inferior ST and T-wave  changes, were felt to be nonspecific.  He had an elevated D-dimer level,  and therefore had a chest CT scan, which was negative for pulmonary  embolism.  He was transferred to [facility] for further workup of his  non-ST segment elevation MI.  He was loaded with Plavix and started on  aspirin and beta-blocker here.  He did have recurrent chest pain  yesterday, which he described more as a pleuritic-type pain that  occurred with deep breathing.  He underwent cardiac catheterization this  morning by Dr. Tonny Bollman, which showed an occluded proximal right  coronary artery with filling of the distal vessel by collaterals from  the LAD.  The LAD and diagonal had 95% bifurcation stenosis.  This was a  large LAD that wrapped around the apex.  The left circumflex had about  50% proximal stenosis, which  was not flow limiting.  Left ventricular  ejection fraction was 45-50% with inferior wall akinesis.  The patient  did have some mild substernal chest pain in the cath lab holding area  after the procedure and was given morphine sulfate with resolution of  this.   REVIEW OF SYSTEMS:  GENERAL:  He denies any fever, chills.  He has had  no recent weight changes.  He has had some fatigue.  EYES:  Negative.  ENT:  Negative.  ENDOCRINE:  He denies diabetes and hyperthyroidism.  CARDIOVASCULAR:  He denies any exertional dyspnea, PND, or orthopnea.  He denies edema and palpitations.  He has had substernal chest pain as  noted above.  RESPIRATORY:  He denies cough and sputum production.  GI:  He has had no nausea or vomiting.  Denies melena and bright red blood  per rectum.  GU:  He denies dysuria and hematuria.  MUSCULOSKELETAL:  He  denies arthralgias and myalgias.  NEUROLOGIC:  He denies any focal  weakness or numbness.  He denies dizziness or syncope.  He has never had  TIA or stroke.   ALLERGIES:  PENICILLIN.   MEDICATIONS PRIOR TO  ADMISSION:  1. Aspirin 325 mg daily.  2. Ibuprofen p.r.n.   PAST MEDICAL HISTORY:  Significant for some ostearthritis.  He had a  history of pilonidal cyst removal as a teen.  He reported having flu  about 3 or 4 weeks ago.   SOCIAL HISTORY:  He is married and has 3 children.  He works as an Psychologist, prison and probation services.  He smokes 1-2 smokes per day and has for many years.  Denies  alcohol abuse, but does drink occasionally on the weekends.  He denies  any drug use.   FAMILY HISTORY:  His mother is 29 and alive.  Father had myocardial  infarction at age 37 and died in 52.  His sister who is alive and well.   PHYSICAL EXAMINATION:  VITAL SIGNS:  Blood pressure 155/90, pulse is 65  and regular, and respiratory rate is 16 and unlabored.  GENERAL:  He is a well-developed white male in no distress.  HEENT:  Normocephalic and atraumatic.  Pupils are equal and reactive to  light and accommodation.  Extraocular muscles are intact.  His throat is  clear.  NECK:  Normal carotid pulses bilaterally.  There are no bruits.  There  is no adenopathy or thyromegaly.  CARDIAC:  Regular rate and rhythm with normal S1 and S2.  There is no  murmur, rub, or gallop.  LUNGS:  Clear.  ABDOMEN:  Active bowel sounds.  His abdomen is soft and nontender.  There are no palpable masses or organomegaly.  EXTREMITIES:  No peripheral edema.  Pedal pulses are palpable  bilaterally.  NEUROLOGIC:  Alert and oriented x3.  Motor and sensory exam is grossly  normal.   IMPRESSION:  Derek [name] has three-vessel coronary artery disease with  his culprit lesions being occlusion of his right coronary artery in  combination with a high-grade proximal left anterior descending and  diagonal bifurcation stenosis.  His left circumflex has mild-to-moderate  disease with lesion in the proximal vessel that is not flow limiting.  I  agree that coronary artery bypass graft surgery is the best treatment to  prevent further ischemia and  infarction.  He did have some chest pain in  the catheterization lab after his procedure and I think it will be best  to proceed ahead with coronary artery bypass surgery  given his anatomy.  I discussed the operative procedure with the patient and his wife  including alternatives, benefits, and risks including but not limited to  bleeding, blood transfusion, infection, stroke, myocardial infarction,  graft failure, and death.  Also, discussed the importance of maximum  cardiac risk factor reduction including complete smoking cessation.  He  understands this and agrees to proceed.      Evelene Croon, M.D.  Electronically Signed     BB/MEDQ  D:  02/03/2009  T:  02/04/2009  Job:  [id number]

## 2015-10-27 DIAGNOSIS — E785 Hyperlipidemia, unspecified: Secondary | ICD-10-CM | POA: Diagnosis not present

## 2015-10-27 DIAGNOSIS — I10 Essential (primary) hypertension: Secondary | ICD-10-CM | POA: Diagnosis not present

## 2015-10-27 DIAGNOSIS — E119 Type 2 diabetes mellitus without complications: Secondary | ICD-10-CM | POA: Diagnosis not present

## 2015-10-27 DIAGNOSIS — K219 Gastro-esophageal reflux disease without esophagitis: Secondary | ICD-10-CM | POA: Diagnosis not present

## 2015-10-31 DIAGNOSIS — I440 Atrioventricular block, first degree: Secondary | ICD-10-CM | POA: Diagnosis not present

## 2015-10-31 DIAGNOSIS — E119 Type 2 diabetes mellitus without complications: Secondary | ICD-10-CM | POA: Diagnosis not present

## 2015-10-31 DIAGNOSIS — Z1389 Encounter for screening for other disorder: Secondary | ICD-10-CM | POA: Diagnosis not present

## 2015-10-31 DIAGNOSIS — I10 Essential (primary) hypertension: Secondary | ICD-10-CM | POA: Diagnosis not present

## 2015-10-31 DIAGNOSIS — E785 Hyperlipidemia, unspecified: Secondary | ICD-10-CM | POA: Diagnosis not present

## 2015-10-31 DIAGNOSIS — Z0000 Encounter for general adult medical examination without abnormal findings: Secondary | ICD-10-CM | POA: Diagnosis not present

## 2015-10-31 DIAGNOSIS — K219 Gastro-esophageal reflux disease without esophagitis: Secondary | ICD-10-CM | POA: Diagnosis not present

## 2015-11-04 DIAGNOSIS — E119 Type 2 diabetes mellitus without complications: Secondary | ICD-10-CM | POA: Diagnosis not present

## 2015-11-04 DIAGNOSIS — H2513 Age-related nuclear cataract, bilateral: Secondary | ICD-10-CM | POA: Diagnosis not present

## 2016-03-19 DIAGNOSIS — M79672 Pain in left foot: Secondary | ICD-10-CM | POA: Diagnosis not present

## 2016-03-19 DIAGNOSIS — I252 Old myocardial infarction: Secondary | ICD-10-CM | POA: Diagnosis not present

## 2016-03-19 DIAGNOSIS — E119 Type 2 diabetes mellitus without complications: Secondary | ICD-10-CM | POA: Diagnosis not present

## 2016-03-19 DIAGNOSIS — L03116 Cellulitis of left lower limb: Secondary | ICD-10-CM | POA: Diagnosis not present

## 2016-03-19 DIAGNOSIS — Z87891 Personal history of nicotine dependence: Secondary | ICD-10-CM | POA: Diagnosis not present

## 2016-03-19 DIAGNOSIS — I10 Essential (primary) hypertension: Secondary | ICD-10-CM | POA: Diagnosis not present

## 2016-03-22 DIAGNOSIS — L03032 Cellulitis of left toe: Secondary | ICD-10-CM | POA: Diagnosis not present

## 2016-03-22 DIAGNOSIS — I10 Essential (primary) hypertension: Secondary | ICD-10-CM | POA: Diagnosis not present

## 2016-03-22 DIAGNOSIS — E119 Type 2 diabetes mellitus without complications: Secondary | ICD-10-CM | POA: Diagnosis not present

## 2016-03-24 DIAGNOSIS — E119 Type 2 diabetes mellitus without complications: Secondary | ICD-10-CM | POA: Diagnosis not present

## 2016-03-24 DIAGNOSIS — I10 Essential (primary) hypertension: Secondary | ICD-10-CM | POA: Diagnosis not present

## 2016-03-24 DIAGNOSIS — L03032 Cellulitis of left toe: Secondary | ICD-10-CM | POA: Diagnosis not present

## 2016-04-19 DIAGNOSIS — H10023 Other mucopurulent conjunctivitis, bilateral: Secondary | ICD-10-CM | POA: Diagnosis not present

## 2016-04-27 DIAGNOSIS — K219 Gastro-esophageal reflux disease without esophagitis: Secondary | ICD-10-CM | POA: Diagnosis not present

## 2016-04-27 DIAGNOSIS — E119 Type 2 diabetes mellitus without complications: Secondary | ICD-10-CM | POA: Diagnosis not present

## 2016-04-27 DIAGNOSIS — E785 Hyperlipidemia, unspecified: Secondary | ICD-10-CM | POA: Diagnosis not present

## 2016-04-27 DIAGNOSIS — I10 Essential (primary) hypertension: Secondary | ICD-10-CM | POA: Diagnosis not present

## 2016-04-30 DIAGNOSIS — E785 Hyperlipidemia, unspecified: Secondary | ICD-10-CM | POA: Diagnosis not present

## 2016-04-30 DIAGNOSIS — K219 Gastro-esophageal reflux disease without esophagitis: Secondary | ICD-10-CM | POA: Diagnosis not present

## 2016-04-30 DIAGNOSIS — Z6830 Body mass index (BMI) 30.0-30.9, adult: Secondary | ICD-10-CM | POA: Diagnosis not present

## 2016-04-30 DIAGNOSIS — E119 Type 2 diabetes mellitus without complications: Secondary | ICD-10-CM | POA: Diagnosis not present

## 2016-04-30 DIAGNOSIS — I10 Essential (primary) hypertension: Secondary | ICD-10-CM | POA: Diagnosis not present

## 2016-08-19 DIAGNOSIS — K219 Gastro-esophageal reflux disease without esophagitis: Secondary | ICD-10-CM | POA: Diagnosis not present

## 2016-08-19 DIAGNOSIS — Z6830 Body mass index (BMI) 30.0-30.9, adult: Secondary | ICD-10-CM | POA: Diagnosis not present

## 2016-10-28 DIAGNOSIS — I440 Atrioventricular block, first degree: Secondary | ICD-10-CM | POA: Diagnosis not present

## 2016-10-28 DIAGNOSIS — E785 Hyperlipidemia, unspecified: Secondary | ICD-10-CM | POA: Diagnosis not present

## 2016-10-28 DIAGNOSIS — I10 Essential (primary) hypertension: Secondary | ICD-10-CM | POA: Diagnosis not present

## 2016-10-28 DIAGNOSIS — K219 Gastro-esophageal reflux disease without esophagitis: Secondary | ICD-10-CM | POA: Diagnosis not present

## 2016-10-28 DIAGNOSIS — E119 Type 2 diabetes mellitus without complications: Secondary | ICD-10-CM | POA: Diagnosis not present

## 2016-11-01 DIAGNOSIS — I440 Atrioventricular block, first degree: Secondary | ICD-10-CM | POA: Diagnosis not present

## 2016-11-01 DIAGNOSIS — I10 Essential (primary) hypertension: Secondary | ICD-10-CM | POA: Diagnosis not present

## 2016-11-01 DIAGNOSIS — E875 Hyperkalemia: Secondary | ICD-10-CM | POA: Diagnosis not present

## 2016-11-01 DIAGNOSIS — R5383 Other fatigue: Secondary | ICD-10-CM | POA: Diagnosis not present

## 2016-11-01 DIAGNOSIS — Z1389 Encounter for screening for other disorder: Secondary | ICD-10-CM | POA: Diagnosis not present

## 2016-11-01 DIAGNOSIS — E1165 Type 2 diabetes mellitus with hyperglycemia: Secondary | ICD-10-CM | POA: Diagnosis not present

## 2016-11-01 DIAGNOSIS — E785 Hyperlipidemia, unspecified: Secondary | ICD-10-CM | POA: Diagnosis not present

## 2016-12-01 DIAGNOSIS — K219 Gastro-esophageal reflux disease without esophagitis: Secondary | ICD-10-CM | POA: Diagnosis not present

## 2016-12-01 DIAGNOSIS — E1165 Type 2 diabetes mellitus with hyperglycemia: Secondary | ICD-10-CM | POA: Diagnosis not present

## 2016-12-01 DIAGNOSIS — E875 Hyperkalemia: Secondary | ICD-10-CM | POA: Diagnosis not present

## 2016-12-01 DIAGNOSIS — R5383 Other fatigue: Secondary | ICD-10-CM | POA: Diagnosis not present

## 2016-12-01 DIAGNOSIS — I10 Essential (primary) hypertension: Secondary | ICD-10-CM | POA: Diagnosis not present

## 2017-05-04 DIAGNOSIS — N529 Male erectile dysfunction, unspecified: Secondary | ICD-10-CM | POA: Diagnosis not present

## 2017-05-04 DIAGNOSIS — I440 Atrioventricular block, first degree: Secondary | ICD-10-CM | POA: Diagnosis not present

## 2017-05-04 DIAGNOSIS — Z0000 Encounter for general adult medical examination without abnormal findings: Secondary | ICD-10-CM | POA: Diagnosis not present

## 2017-05-04 DIAGNOSIS — E875 Hyperkalemia: Secondary | ICD-10-CM | POA: Diagnosis not present

## 2017-05-04 DIAGNOSIS — R5383 Other fatigue: Secondary | ICD-10-CM | POA: Diagnosis not present

## 2017-05-04 DIAGNOSIS — E785 Hyperlipidemia, unspecified: Secondary | ICD-10-CM | POA: Diagnosis not present

## 2017-05-04 DIAGNOSIS — E1165 Type 2 diabetes mellitus with hyperglycemia: Secondary | ICD-10-CM | POA: Diagnosis not present

## 2017-05-04 DIAGNOSIS — I10 Essential (primary) hypertension: Secondary | ICD-10-CM | POA: Diagnosis not present

## 2017-05-04 DIAGNOSIS — Z6828 Body mass index (BMI) 28.0-28.9, adult: Secondary | ICD-10-CM | POA: Diagnosis not present

## 2017-07-27 DIAGNOSIS — I10 Essential (primary) hypertension: Secondary | ICD-10-CM | POA: Diagnosis not present

## 2017-07-27 DIAGNOSIS — K219 Gastro-esophageal reflux disease without esophagitis: Secondary | ICD-10-CM | POA: Diagnosis not present

## 2017-07-27 DIAGNOSIS — E1165 Type 2 diabetes mellitus with hyperglycemia: Secondary | ICD-10-CM | POA: Diagnosis not present

## 2017-07-27 DIAGNOSIS — E875 Hyperkalemia: Secondary | ICD-10-CM | POA: Diagnosis not present

## 2017-07-27 DIAGNOSIS — E785 Hyperlipidemia, unspecified: Secondary | ICD-10-CM | POA: Diagnosis not present

## 2017-07-27 DIAGNOSIS — I440 Atrioventricular block, first degree: Secondary | ICD-10-CM | POA: Diagnosis not present

## 2017-07-27 DIAGNOSIS — N529 Male erectile dysfunction, unspecified: Secondary | ICD-10-CM | POA: Diagnosis not present

## 2017-08-02 DIAGNOSIS — R5383 Other fatigue: Secondary | ICD-10-CM | POA: Diagnosis not present

## 2017-08-02 DIAGNOSIS — E1165 Type 2 diabetes mellitus with hyperglycemia: Secondary | ICD-10-CM | POA: Diagnosis not present

## 2017-08-02 DIAGNOSIS — E785 Hyperlipidemia, unspecified: Secondary | ICD-10-CM | POA: Diagnosis not present

## 2017-08-02 DIAGNOSIS — Z6830 Body mass index (BMI) 30.0-30.9, adult: Secondary | ICD-10-CM | POA: Diagnosis not present

## 2017-08-02 DIAGNOSIS — I10 Essential (primary) hypertension: Secondary | ICD-10-CM | POA: Diagnosis not present

## 2017-10-31 DIAGNOSIS — E1165 Type 2 diabetes mellitus with hyperglycemia: Secondary | ICD-10-CM | POA: Diagnosis not present

## 2017-10-31 DIAGNOSIS — E785 Hyperlipidemia, unspecified: Secondary | ICD-10-CM | POA: Diagnosis not present

## 2017-10-31 DIAGNOSIS — I10 Essential (primary) hypertension: Secondary | ICD-10-CM | POA: Diagnosis not present

## 2017-10-31 DIAGNOSIS — E875 Hyperkalemia: Secondary | ICD-10-CM | POA: Diagnosis not present

## 2017-10-31 DIAGNOSIS — K219 Gastro-esophageal reflux disease without esophagitis: Secondary | ICD-10-CM | POA: Diagnosis not present

## 2017-10-31 DIAGNOSIS — R5383 Other fatigue: Secondary | ICD-10-CM | POA: Diagnosis not present

## 2017-10-31 DIAGNOSIS — I440 Atrioventricular block, first degree: Secondary | ICD-10-CM | POA: Diagnosis not present

## 2017-11-02 DIAGNOSIS — N183 Chronic kidney disease, stage 3 (moderate): Secondary | ICD-10-CM | POA: Diagnosis not present

## 2017-11-02 DIAGNOSIS — E1165 Type 2 diabetes mellitus with hyperglycemia: Secondary | ICD-10-CM | POA: Diagnosis not present

## 2017-11-02 DIAGNOSIS — Z6830 Body mass index (BMI) 30.0-30.9, adult: Secondary | ICD-10-CM | POA: Diagnosis not present

## 2017-11-02 DIAGNOSIS — E785 Hyperlipidemia, unspecified: Secondary | ICD-10-CM | POA: Diagnosis not present

## 2017-11-02 DIAGNOSIS — E875 Hyperkalemia: Secondary | ICD-10-CM | POA: Diagnosis not present

## 2017-11-02 DIAGNOSIS — R945 Abnormal results of liver function studies: Secondary | ICD-10-CM | POA: Diagnosis not present

## 2017-11-02 DIAGNOSIS — I10 Essential (primary) hypertension: Secondary | ICD-10-CM | POA: Diagnosis not present

## 2017-11-02 DIAGNOSIS — Z1389 Encounter for screening for other disorder: Secondary | ICD-10-CM | POA: Diagnosis not present

## 2018-01-31 DIAGNOSIS — E1165 Type 2 diabetes mellitus with hyperglycemia: Secondary | ICD-10-CM | POA: Diagnosis not present

## 2018-01-31 DIAGNOSIS — R5383 Other fatigue: Secondary | ICD-10-CM | POA: Diagnosis not present

## 2018-01-31 DIAGNOSIS — I10 Essential (primary) hypertension: Secondary | ICD-10-CM | POA: Diagnosis not present

## 2018-01-31 DIAGNOSIS — K219 Gastro-esophageal reflux disease without esophagitis: Secondary | ICD-10-CM | POA: Diagnosis not present

## 2018-02-02 DIAGNOSIS — K219 Gastro-esophageal reflux disease without esophagitis: Secondary | ICD-10-CM | POA: Diagnosis not present

## 2018-02-02 DIAGNOSIS — E1165 Type 2 diabetes mellitus with hyperglycemia: Secondary | ICD-10-CM | POA: Diagnosis not present

## 2018-02-02 DIAGNOSIS — R69 Illness, unspecified: Secondary | ICD-10-CM | POA: Diagnosis not present

## 2018-02-02 DIAGNOSIS — E785 Hyperlipidemia, unspecified: Secondary | ICD-10-CM | POA: Diagnosis not present

## 2018-02-02 DIAGNOSIS — N183 Chronic kidney disease, stage 3 (moderate): Secondary | ICD-10-CM | POA: Diagnosis not present

## 2018-02-02 DIAGNOSIS — E875 Hyperkalemia: Secondary | ICD-10-CM | POA: Diagnosis not present

## 2018-02-02 DIAGNOSIS — Z6830 Body mass index (BMI) 30.0-30.9, adult: Secondary | ICD-10-CM | POA: Diagnosis not present

## 2018-02-02 DIAGNOSIS — I10 Essential (primary) hypertension: Secondary | ICD-10-CM | POA: Diagnosis not present

## 2018-03-02 DIAGNOSIS — K219 Gastro-esophageal reflux disease without esophagitis: Secondary | ICD-10-CM | POA: Diagnosis not present

## 2018-03-02 DIAGNOSIS — E669 Obesity, unspecified: Secondary | ICD-10-CM | POA: Diagnosis not present

## 2018-03-02 DIAGNOSIS — I252 Old myocardial infarction: Secondary | ICD-10-CM | POA: Diagnosis not present

## 2018-03-02 DIAGNOSIS — E785 Hyperlipidemia, unspecified: Secondary | ICD-10-CM | POA: Diagnosis not present

## 2018-03-02 DIAGNOSIS — M1990 Unspecified osteoarthritis, unspecified site: Secondary | ICD-10-CM | POA: Diagnosis not present

## 2018-03-02 DIAGNOSIS — E1159 Type 2 diabetes mellitus with other circulatory complications: Secondary | ICD-10-CM | POA: Diagnosis not present

## 2018-03-02 DIAGNOSIS — G8929 Other chronic pain: Secondary | ICD-10-CM | POA: Diagnosis not present

## 2018-03-02 DIAGNOSIS — I2510 Atherosclerotic heart disease of native coronary artery without angina pectoris: Secondary | ICD-10-CM | POA: Diagnosis not present

## 2018-03-02 DIAGNOSIS — N529 Male erectile dysfunction, unspecified: Secondary | ICD-10-CM | POA: Diagnosis not present

## 2018-03-02 DIAGNOSIS — I10 Essential (primary) hypertension: Secondary | ICD-10-CM | POA: Diagnosis not present

## 2018-04-12 DIAGNOSIS — R0982 Postnasal drip: Secondary | ICD-10-CM | POA: Diagnosis not present

## 2018-04-12 DIAGNOSIS — J028 Acute pharyngitis due to other specified organisms: Secondary | ICD-10-CM | POA: Diagnosis not present

## 2018-04-12 DIAGNOSIS — Z6831 Body mass index (BMI) 31.0-31.9, adult: Secondary | ICD-10-CM | POA: Diagnosis not present

## 2018-06-09 DIAGNOSIS — R69 Illness, unspecified: Secondary | ICD-10-CM | POA: Diagnosis not present

## 2018-07-30 DIAGNOSIS — I10 Essential (primary) hypertension: Secondary | ICD-10-CM | POA: Diagnosis not present

## 2018-07-30 DIAGNOSIS — I252 Old myocardial infarction: Secondary | ICD-10-CM | POA: Diagnosis not present

## 2018-07-30 DIAGNOSIS — N200 Calculus of kidney: Secondary | ICD-10-CM | POA: Diagnosis not present

## 2018-07-30 DIAGNOSIS — E119 Type 2 diabetes mellitus without complications: Secondary | ICD-10-CM | POA: Diagnosis not present

## 2018-07-30 DIAGNOSIS — R1032 Left lower quadrant pain: Secondary | ICD-10-CM | POA: Diagnosis not present

## 2018-07-30 DIAGNOSIS — N132 Hydronephrosis with renal and ureteral calculous obstruction: Secondary | ICD-10-CM | POA: Diagnosis not present

## 2018-07-30 DIAGNOSIS — Z87442 Personal history of urinary calculi: Secondary | ICD-10-CM | POA: Diagnosis not present

## 2018-07-30 DIAGNOSIS — E785 Hyperlipidemia, unspecified: Secondary | ICD-10-CM | POA: Diagnosis not present

## 2018-07-30 DIAGNOSIS — Z79899 Other long term (current) drug therapy: Secondary | ICD-10-CM | POA: Diagnosis not present

## 2018-07-31 DIAGNOSIS — K219 Gastro-esophageal reflux disease without esophagitis: Secondary | ICD-10-CM | POA: Diagnosis not present

## 2018-07-31 DIAGNOSIS — N183 Chronic kidney disease, stage 3 (moderate): Secondary | ICD-10-CM | POA: Diagnosis not present

## 2018-07-31 DIAGNOSIS — R945 Abnormal results of liver function studies: Secondary | ICD-10-CM | POA: Diagnosis not present

## 2018-07-31 DIAGNOSIS — R5383 Other fatigue: Secondary | ICD-10-CM | POA: Diagnosis not present

## 2018-07-31 DIAGNOSIS — E875 Hyperkalemia: Secondary | ICD-10-CM | POA: Diagnosis not present

## 2018-07-31 DIAGNOSIS — E1165 Type 2 diabetes mellitus with hyperglycemia: Secondary | ICD-10-CM | POA: Diagnosis not present

## 2018-07-31 DIAGNOSIS — E785 Hyperlipidemia, unspecified: Secondary | ICD-10-CM | POA: Diagnosis not present

## 2018-07-31 DIAGNOSIS — I10 Essential (primary) hypertension: Secondary | ICD-10-CM | POA: Diagnosis not present

## 2018-08-07 DIAGNOSIS — K219 Gastro-esophageal reflux disease without esophagitis: Secondary | ICD-10-CM | POA: Diagnosis not present

## 2018-08-07 DIAGNOSIS — N183 Chronic kidney disease, stage 3 (moderate): Secondary | ICD-10-CM | POA: Diagnosis not present

## 2018-08-07 DIAGNOSIS — Z6830 Body mass index (BMI) 30.0-30.9, adult: Secondary | ICD-10-CM | POA: Diagnosis not present

## 2018-08-07 DIAGNOSIS — E785 Hyperlipidemia, unspecified: Secondary | ICD-10-CM | POA: Diagnosis not present

## 2018-08-07 DIAGNOSIS — I10 Essential (primary) hypertension: Secondary | ICD-10-CM | POA: Diagnosis not present

## 2018-08-07 DIAGNOSIS — E1165 Type 2 diabetes mellitus with hyperglycemia: Secondary | ICD-10-CM | POA: Diagnosis not present

## 2018-08-07 DIAGNOSIS — R5383 Other fatigue: Secondary | ICD-10-CM | POA: Diagnosis not present

## 2018-08-21 DIAGNOSIS — Z23 Encounter for immunization: Secondary | ICD-10-CM | POA: Diagnosis not present

## 2018-08-21 DIAGNOSIS — Z6829 Body mass index (BMI) 29.0-29.9, adult: Secondary | ICD-10-CM | POA: Diagnosis not present

## 2018-08-21 DIAGNOSIS — S80811A Abrasion, right lower leg, initial encounter: Secondary | ICD-10-CM | POA: Diagnosis not present

## 2018-09-04 DIAGNOSIS — R945 Abnormal results of liver function studies: Secondary | ICD-10-CM | POA: Diagnosis not present

## 2018-09-04 DIAGNOSIS — E1165 Type 2 diabetes mellitus with hyperglycemia: Secondary | ICD-10-CM | POA: Diagnosis not present

## 2018-09-04 DIAGNOSIS — I10 Essential (primary) hypertension: Secondary | ICD-10-CM | POA: Diagnosis not present

## 2018-09-04 DIAGNOSIS — E875 Hyperkalemia: Secondary | ICD-10-CM | POA: Diagnosis not present

## 2018-09-04 DIAGNOSIS — E785 Hyperlipidemia, unspecified: Secondary | ICD-10-CM | POA: Diagnosis not present

## 2018-09-04 DIAGNOSIS — N183 Chronic kidney disease, stage 3 (moderate): Secondary | ICD-10-CM | POA: Diagnosis not present

## 2018-09-12 DIAGNOSIS — K760 Fatty (change of) liver, not elsewhere classified: Secondary | ICD-10-CM | POA: Diagnosis not present

## 2018-09-12 DIAGNOSIS — N200 Calculus of kidney: Secondary | ICD-10-CM | POA: Diagnosis not present

## 2018-09-12 DIAGNOSIS — N201 Calculus of ureter: Secondary | ICD-10-CM | POA: Diagnosis not present

## 2018-09-12 DIAGNOSIS — R1031 Right lower quadrant pain: Secondary | ICD-10-CM | POA: Diagnosis not present

## 2018-09-12 DIAGNOSIS — Z7982 Long term (current) use of aspirin: Secondary | ICD-10-CM | POA: Diagnosis not present

## 2018-09-12 DIAGNOSIS — N202 Calculus of kidney with calculus of ureter: Secondary | ICD-10-CM | POA: Diagnosis not present

## 2018-09-12 DIAGNOSIS — Z79899 Other long term (current) drug therapy: Secondary | ICD-10-CM | POA: Diagnosis not present

## 2018-09-21 DIAGNOSIS — Z6829 Body mass index (BMI) 29.0-29.9, adult: Secondary | ICD-10-CM | POA: Diagnosis not present

## 2018-09-21 DIAGNOSIS — N200 Calculus of kidney: Secondary | ICD-10-CM | POA: Diagnosis not present

## 2018-11-14 ENCOUNTER — Ambulatory Visit: Payer: Medicare HMO | Admitting: Urology

## 2018-11-14 DIAGNOSIS — N200 Calculus of kidney: Secondary | ICD-10-CM | POA: Diagnosis not present

## 2018-11-21 DIAGNOSIS — N183 Chronic kidney disease, stage 3 (moderate): Secondary | ICD-10-CM | POA: Diagnosis not present

## 2018-11-21 DIAGNOSIS — I10 Essential (primary) hypertension: Secondary | ICD-10-CM | POA: Diagnosis not present

## 2018-11-21 DIAGNOSIS — M159 Polyosteoarthritis, unspecified: Secondary | ICD-10-CM | POA: Diagnosis not present

## 2018-11-21 DIAGNOSIS — Z6829 Body mass index (BMI) 29.0-29.9, adult: Secondary | ICD-10-CM | POA: Diagnosis not present

## 2019-01-23 DIAGNOSIS — I252 Old myocardial infarction: Secondary | ICD-10-CM | POA: Diagnosis not present

## 2019-01-23 DIAGNOSIS — I10 Essential (primary) hypertension: Secondary | ICD-10-CM | POA: Diagnosis not present

## 2019-01-23 DIAGNOSIS — I2510 Atherosclerotic heart disease of native coronary artery without angina pectoris: Secondary | ICD-10-CM | POA: Diagnosis not present

## 2019-01-23 DIAGNOSIS — M1990 Unspecified osteoarthritis, unspecified site: Secondary | ICD-10-CM | POA: Diagnosis not present

## 2019-01-23 DIAGNOSIS — N400 Enlarged prostate without lower urinary tract symptoms: Secondary | ICD-10-CM | POA: Diagnosis not present

## 2019-01-23 DIAGNOSIS — E785 Hyperlipidemia, unspecified: Secondary | ICD-10-CM | POA: Diagnosis not present

## 2019-01-23 DIAGNOSIS — G8929 Other chronic pain: Secondary | ICD-10-CM | POA: Diagnosis not present

## 2019-01-23 DIAGNOSIS — E1159 Type 2 diabetes mellitus with other circulatory complications: Secondary | ICD-10-CM | POA: Diagnosis not present

## 2019-01-23 DIAGNOSIS — N529 Male erectile dysfunction, unspecified: Secondary | ICD-10-CM | POA: Diagnosis not present

## 2019-01-23 DIAGNOSIS — J309 Allergic rhinitis, unspecified: Secondary | ICD-10-CM | POA: Diagnosis not present

## 2019-01-24 DIAGNOSIS — N183 Chronic kidney disease, stage 3 (moderate): Secondary | ICD-10-CM | POA: Diagnosis not present

## 2019-01-24 DIAGNOSIS — K219 Gastro-esophageal reflux disease without esophagitis: Secondary | ICD-10-CM | POA: Diagnosis not present

## 2019-01-24 DIAGNOSIS — E1165 Type 2 diabetes mellitus with hyperglycemia: Secondary | ICD-10-CM | POA: Diagnosis not present

## 2019-01-24 DIAGNOSIS — I10 Essential (primary) hypertension: Secondary | ICD-10-CM | POA: Diagnosis not present

## 2019-01-24 DIAGNOSIS — R945 Abnormal results of liver function studies: Secondary | ICD-10-CM | POA: Diagnosis not present

## 2019-01-24 DIAGNOSIS — E785 Hyperlipidemia, unspecified: Secondary | ICD-10-CM | POA: Diagnosis not present

## 2019-01-29 DIAGNOSIS — M159 Polyosteoarthritis, unspecified: Secondary | ICD-10-CM | POA: Diagnosis not present

## 2019-01-29 DIAGNOSIS — I10 Essential (primary) hypertension: Secondary | ICD-10-CM | POA: Diagnosis not present

## 2019-01-29 DIAGNOSIS — Z6829 Body mass index (BMI) 29.0-29.9, adult: Secondary | ICD-10-CM | POA: Diagnosis not present

## 2019-01-29 DIAGNOSIS — E785 Hyperlipidemia, unspecified: Secondary | ICD-10-CM | POA: Diagnosis not present

## 2019-01-29 DIAGNOSIS — K219 Gastro-esophageal reflux disease without esophagitis: Secondary | ICD-10-CM | POA: Diagnosis not present

## 2019-01-29 DIAGNOSIS — Z0000 Encounter for general adult medical examination without abnormal findings: Secondary | ICD-10-CM | POA: Diagnosis not present

## 2019-01-29 DIAGNOSIS — E119 Type 2 diabetes mellitus without complications: Secondary | ICD-10-CM | POA: Diagnosis not present

## 2019-01-29 DIAGNOSIS — R0789 Other chest pain: Secondary | ICD-10-CM | POA: Diagnosis not present

## 2019-03-20 ENCOUNTER — Encounter: Payer: Self-pay | Admitting: *Deleted

## 2019-03-20 DIAGNOSIS — E1122 Type 2 diabetes mellitus with diabetic chronic kidney disease: Secondary | ICD-10-CM

## 2019-03-20 DIAGNOSIS — E119 Type 2 diabetes mellitus without complications: Secondary | ICD-10-CM | POA: Insufficient documentation

## 2019-03-20 DIAGNOSIS — K219 Gastro-esophageal reflux disease without esophagitis: Secondary | ICD-10-CM

## 2019-03-20 DIAGNOSIS — N183 Chronic kidney disease, stage 3 unspecified: Secondary | ICD-10-CM

## 2019-03-21 ENCOUNTER — Ambulatory Visit: Payer: Medicare HMO | Admitting: Cardiology

## 2019-04-03 DIAGNOSIS — N200 Calculus of kidney: Secondary | ICD-10-CM | POA: Diagnosis not present

## 2019-04-10 ENCOUNTER — Ambulatory Visit: Payer: Medicare HMO | Admitting: Urology

## 2019-04-24 DIAGNOSIS — R945 Abnormal results of liver function studies: Secondary | ICD-10-CM | POA: Diagnosis not present

## 2019-04-24 DIAGNOSIS — R5383 Other fatigue: Secondary | ICD-10-CM | POA: Diagnosis not present

## 2019-04-24 DIAGNOSIS — E875 Hyperkalemia: Secondary | ICD-10-CM | POA: Diagnosis not present

## 2019-04-24 DIAGNOSIS — E1165 Type 2 diabetes mellitus with hyperglycemia: Secondary | ICD-10-CM | POA: Diagnosis not present

## 2019-04-24 DIAGNOSIS — I10 Essential (primary) hypertension: Secondary | ICD-10-CM | POA: Diagnosis not present

## 2019-04-24 DIAGNOSIS — E785 Hyperlipidemia, unspecified: Secondary | ICD-10-CM | POA: Diagnosis not present

## 2019-04-24 DIAGNOSIS — K219 Gastro-esophageal reflux disease without esophagitis: Secondary | ICD-10-CM | POA: Diagnosis not present

## 2019-04-27 DIAGNOSIS — K219 Gastro-esophageal reflux disease without esophagitis: Secondary | ICD-10-CM | POA: Diagnosis not present

## 2019-04-27 DIAGNOSIS — E119 Type 2 diabetes mellitus without complications: Secondary | ICD-10-CM | POA: Diagnosis not present

## 2019-04-27 DIAGNOSIS — R0789 Other chest pain: Secondary | ICD-10-CM | POA: Diagnosis not present

## 2019-04-27 DIAGNOSIS — N183 Chronic kidney disease, stage 3 (moderate): Secondary | ICD-10-CM | POA: Diagnosis not present

## 2019-04-27 DIAGNOSIS — I10 Essential (primary) hypertension: Secondary | ICD-10-CM | POA: Diagnosis not present

## 2019-04-27 DIAGNOSIS — M159 Polyosteoarthritis, unspecified: Secondary | ICD-10-CM | POA: Diagnosis not present

## 2019-04-27 DIAGNOSIS — Z6829 Body mass index (BMI) 29.0-29.9, adult: Secondary | ICD-10-CM | POA: Diagnosis not present

## 2019-04-27 DIAGNOSIS — E785 Hyperlipidemia, unspecified: Secondary | ICD-10-CM | POA: Diagnosis not present

## 2019-05-15 ENCOUNTER — Encounter: Payer: Self-pay | Admitting: *Deleted

## 2019-05-15 DIAGNOSIS — Z6828 Body mass index (BMI) 28.0-28.9, adult: Secondary | ICD-10-CM | POA: Diagnosis not present

## 2019-05-15 DIAGNOSIS — R1084 Generalized abdominal pain: Secondary | ICD-10-CM | POA: Diagnosis not present

## 2019-05-15 DIAGNOSIS — N200 Calculus of kidney: Secondary | ICD-10-CM | POA: Diagnosis not present

## 2019-05-16 ENCOUNTER — Encounter (HOSPITAL_COMMUNITY): Payer: Self-pay

## 2019-05-16 ENCOUNTER — Ambulatory Visit: Payer: Medicare HMO | Admitting: Cardiology

## 2019-05-16 ENCOUNTER — Encounter (HOSPITAL_COMMUNITY)
Admission: EM | Disposition: A | Discharge status: Home / Self Care | Payer: Self-pay | Source: Ambulatory Visit | Attending: Internal Medicine

## 2019-05-16 ENCOUNTER — Inpatient Hospital Stay: Admit: 2019-05-16 | Payer: Medicare HMO | Admitting: Urology

## 2019-05-16 ENCOUNTER — Inpatient Hospital Stay (HOSPITAL_COMMUNITY)
Admission: EM | Admit: 2019-05-16 | Discharge: 2019-05-18 | DRG: 854 | Disposition: A | Discharge status: Home / Self Care | Payer: Medicare HMO | Source: Ambulatory Visit | Attending: Internal Medicine | Admitting: Internal Medicine

## 2019-05-16 ENCOUNTER — Other Ambulatory Visit: Payer: Self-pay

## 2019-05-16 ENCOUNTER — Observation Stay (HOSPITAL_COMMUNITY): Payer: Medicare HMO

## 2019-05-16 ENCOUNTER — Observation Stay (HOSPITAL_COMMUNITY): Payer: Medicare HMO | Admitting: Certified Registered Nurse Anesthetist

## 2019-05-16 DIAGNOSIS — E875 Hyperkalemia: Secondary | ICD-10-CM | POA: Diagnosis not present

## 2019-05-16 DIAGNOSIS — N200 Calculus of kidney: Secondary | ICD-10-CM | POA: Diagnosis not present

## 2019-05-16 DIAGNOSIS — K5730 Diverticulosis of large intestine without perforation or abscess without bleeding: Secondary | ICD-10-CM | POA: Diagnosis not present

## 2019-05-16 DIAGNOSIS — Z7984 Long term (current) use of oral hypoglycemic drugs: Secondary | ICD-10-CM

## 2019-05-16 DIAGNOSIS — Z20828 Contact with and (suspected) exposure to other viral communicable diseases: Secondary | ICD-10-CM | POA: Diagnosis not present

## 2019-05-16 DIAGNOSIS — N179 Acute kidney failure, unspecified: Secondary | ICD-10-CM | POA: Diagnosis present

## 2019-05-16 DIAGNOSIS — R7989 Other specified abnormal findings of blood chemistry: Secondary | ICD-10-CM | POA: Diagnosis not present

## 2019-05-16 DIAGNOSIS — E785 Hyperlipidemia, unspecified: Secondary | ICD-10-CM | POA: Diagnosis not present

## 2019-05-16 DIAGNOSIS — E119 Type 2 diabetes mellitus without complications: Secondary | ICD-10-CM | POA: Diagnosis not present

## 2019-05-16 DIAGNOSIS — E860 Dehydration: Secondary | ICD-10-CM | POA: Diagnosis present

## 2019-05-16 DIAGNOSIS — Z87891 Personal history of nicotine dependence: Secondary | ICD-10-CM | POA: Diagnosis not present

## 2019-05-16 DIAGNOSIS — Z03818 Encounter for observation for suspected exposure to other biological agents ruled out: Secondary | ICD-10-CM | POA: Diagnosis not present

## 2019-05-16 DIAGNOSIS — Z880 Allergy status to penicillin: Secondary | ICD-10-CM

## 2019-05-16 DIAGNOSIS — Z8349 Family history of other endocrine, nutritional and metabolic diseases: Secondary | ICD-10-CM

## 2019-05-16 DIAGNOSIS — K760 Fatty (change of) liver, not elsewhere classified: Secondary | ICD-10-CM | POA: Diagnosis not present

## 2019-05-16 DIAGNOSIS — Z87442 Personal history of urinary calculi: Secondary | ICD-10-CM | POA: Diagnosis not present

## 2019-05-16 DIAGNOSIS — A419 Sepsis, unspecified organism: Principal | ICD-10-CM | POA: Diagnosis present

## 2019-05-16 DIAGNOSIS — Z8249 Family history of ischemic heart disease and other diseases of the circulatory system: Secondary | ICD-10-CM | POA: Diagnosis not present

## 2019-05-16 DIAGNOSIS — Z79891 Long term (current) use of opiate analgesic: Secondary | ICD-10-CM

## 2019-05-16 DIAGNOSIS — I129 Hypertensive chronic kidney disease with stage 1 through stage 4 chronic kidney disease, or unspecified chronic kidney disease: Secondary | ICD-10-CM | POA: Diagnosis present

## 2019-05-16 DIAGNOSIS — E1165 Type 2 diabetes mellitus with hyperglycemia: Secondary | ICD-10-CM | POA: Diagnosis present

## 2019-05-16 DIAGNOSIS — Z7982 Long term (current) use of aspirin: Secondary | ICD-10-CM

## 2019-05-16 DIAGNOSIS — I10 Essential (primary) hypertension: Secondary | ICD-10-CM | POA: Diagnosis present

## 2019-05-16 DIAGNOSIS — Z79899 Other long term (current) drug therapy: Secondary | ICD-10-CM

## 2019-05-16 DIAGNOSIS — I252 Old myocardial infarction: Secondary | ICD-10-CM | POA: Diagnosis not present

## 2019-05-16 DIAGNOSIS — K5790 Diverticulosis of intestine, part unspecified, without perforation or abscess without bleeding: Secondary | ICD-10-CM | POA: Diagnosis not present

## 2019-05-16 DIAGNOSIS — N202 Calculus of kidney with calculus of ureter: Secondary | ICD-10-CM | POA: Diagnosis present

## 2019-05-16 DIAGNOSIS — E1122 Type 2 diabetes mellitus with diabetic chronic kidney disease: Secondary | ICD-10-CM | POA: Diagnosis present

## 2019-05-16 DIAGNOSIS — N183 Chronic kidney disease, stage 3 unspecified: Secondary | ICD-10-CM | POA: Diagnosis present

## 2019-05-16 DIAGNOSIS — E1100 Type 2 diabetes mellitus with hyperosmolarity without nonketotic hyperglycemic-hyperosmolar coma (NKHHC): Secondary | ICD-10-CM | POA: Diagnosis not present

## 2019-05-16 DIAGNOSIS — R109 Unspecified abdominal pain: Secondary | ICD-10-CM | POA: Diagnosis not present

## 2019-05-16 DIAGNOSIS — I7090 Unspecified atherosclerosis: Secondary | ICD-10-CM | POA: Diagnosis not present

## 2019-05-16 DIAGNOSIS — R509 Fever, unspecified: Secondary | ICD-10-CM | POA: Diagnosis not present

## 2019-05-16 DIAGNOSIS — N201 Calculus of ureter: Secondary | ICD-10-CM | POA: Diagnosis not present

## 2019-05-16 DIAGNOSIS — N132 Hydronephrosis with renal and ureteral calculous obstruction: Secondary | ICD-10-CM | POA: Diagnosis not present

## 2019-05-16 HISTORY — PX: CYSTOSCOPY W/ URETERAL STENT PLACEMENT: SHX1429

## 2019-05-16 LAB — GLUCOSE, CAPILLARY
Glucose-Capillary: 109 mg/dL — ABNORMAL HIGH (ref 70–99)
Glucose-Capillary: 112 mg/dL — ABNORMAL HIGH (ref 70–99)
Glucose-Capillary: 237 mg/dL — ABNORMAL HIGH (ref 70–99)

## 2019-05-16 LAB — SARS CORONAVIRUS 2 BY RT PCR (HOSPITAL ORDER, PERFORMED IN ~~LOC~~ HOSPITAL LAB): SARS Coronavirus 2: NEGATIVE

## 2019-05-16 LAB — HEMOGLOBIN A1C
Hgb A1c MFr Bld: 6.6 % — ABNORMAL HIGH (ref 4.8–5.6)
Mean Plasma Glucose: 142.72 mg/dL

## 2019-05-16 SURGERY — CYSTOSCOPY, WITH RETROGRADE PYELOGRAM AND URETERAL STENT INSERTION
Anesthesia: General | Site: Ureter | Laterality: Right

## 2019-05-16 MED ORDER — METOPROLOL TARTRATE 25 MG PO TABS
25.0000 mg | ORAL_TABLET | Freq: Two times a day (BID) | ORAL | Status: DC
  Administered 2019-05-16 – 2019-05-17 (×3): 25 mg via ORAL
  Filled 2019-05-16 (×4): qty 1

## 2019-05-16 MED ORDER — SODIUM CHLORIDE 0.9 % IV SOLN
1.0000 g | INTRAVENOUS | Status: DC
  Administered 2019-05-16 – 2019-05-17 (×2): 1 g via INTRAVENOUS
  Filled 2019-05-16: qty 10
  Filled 2019-05-16 (×2): qty 1

## 2019-05-16 MED ORDER — HYDROCODONE-ACETAMINOPHEN 5-325 MG PO TABS: 1.0000 | ORAL_TABLET | Freq: Four times a day (QID) | ORAL | Status: DC | PRN

## 2019-05-16 MED ORDER — PROPOFOL 10 MG/ML IV BOLUS
INTRAVENOUS | Status: AC
  Filled 2019-05-16: qty 20

## 2019-05-16 MED ORDER — LIDOCAINE 2% (20 MG/ML) 5 ML SYRINGE
INTRAMUSCULAR | Status: DC | PRN
  Administered 2019-05-16: 80 mg via INTRAVENOUS

## 2019-05-16 MED ORDER — DEXAMETHASONE SODIUM PHOSPHATE 10 MG/ML IJ SOLN
INTRAMUSCULAR | Status: AC
  Filled 2019-05-16: qty 1

## 2019-05-16 MED ORDER — LIDOCAINE 2% (20 MG/ML) 5 ML SYRINGE
INTRAMUSCULAR | Status: AC
  Filled 2019-05-16: qty 5

## 2019-05-16 MED ORDER — INSULIN ASPART 100 UNIT/ML ~~LOC~~ SOLN
0.0000 [IU] | Freq: Three times a day (TID) | SUBCUTANEOUS | Status: DC
  Administered 2019-05-17 (×2): 2 [IU] via SUBCUTANEOUS

## 2019-05-16 MED ORDER — ONDANSETRON HCL 4 MG/2ML IJ SOLN: 4.0000 mg | Freq: Four times a day (QID) | INTRAMUSCULAR | Status: DC | PRN

## 2019-05-16 MED ORDER — SODIUM CHLORIDE 0.9 % IV SOLN
INTRAVENOUS | Status: DC
  Administered 2019-05-16 – 2019-05-18 (×5): via INTRAVENOUS

## 2019-05-16 MED ORDER — PRAVASTATIN SODIUM 40 MG PO TABS
40.0000 mg | ORAL_TABLET | Freq: Every day | ORAL | Status: DC
  Administered 2019-05-16 – 2019-05-17 (×2): 40 mg via ORAL
  Filled 2019-05-16 (×2): qty 1

## 2019-05-16 MED ORDER — ONDANSETRON HCL 4 MG/2ML IJ SOLN
INTRAMUSCULAR | Status: DC | PRN
  Administered 2019-05-16: 4 mg via INTRAVENOUS

## 2019-05-16 MED ORDER — CEFAZOLIN SODIUM-DEXTROSE 2-4 GM/100ML-% IV SOLN
2.0000 g | Freq: Once | INTRAVENOUS | Status: AC
  Administered 2019-05-16: 2 g via INTRAVENOUS

## 2019-05-16 MED ORDER — FENTANYL CITRATE (PF) 100 MCG/2ML IJ SOLN
INTRAMUSCULAR | Status: AC
  Filled 2019-05-16: qty 2

## 2019-05-16 MED ORDER — FENTANYL CITRATE (PF) 100 MCG/2ML IJ SOLN
INTRAMUSCULAR | Status: DC | PRN
  Administered 2019-05-16 (×2): 50 ug via INTRAVENOUS

## 2019-05-16 MED ORDER — HYDROCHLOROTHIAZIDE 25 MG PO TABS
12.5000 mg | ORAL_TABLET | Freq: Every day | ORAL | Status: DC
  Filled 2019-05-16: qty 1

## 2019-05-16 MED ORDER — PHENYLEPHRINE 40 MCG/ML (10ML) SYRINGE FOR IV PUSH (FOR BLOOD PRESSURE SUPPORT)
PREFILLED_SYRINGE | INTRAVENOUS | Status: AC
  Filled 2019-05-16: qty 10

## 2019-05-16 MED ORDER — PHENYLEPHRINE 40 MCG/ML (10ML) SYRINGE FOR IV PUSH (FOR BLOOD PRESSURE SUPPORT)
PREFILLED_SYRINGE | INTRAVENOUS | Status: DC | PRN
  Administered 2019-05-16 (×3): 80 ug via INTRAVENOUS

## 2019-05-16 MED ORDER — TRAMADOL HCL 50 MG PO TABS: 50.0000 mg | ORAL_TABLET | Freq: Two times a day (BID) | ORAL | Status: DC | PRN

## 2019-05-16 MED ORDER — SODIUM CHLORIDE 0.9 % IR SOLN
Status: DC | PRN
  Administered 2019-05-16: 3000 mL via INTRAVESICAL

## 2019-05-16 MED ORDER — DEXAMETHASONE SODIUM PHOSPHATE 4 MG/ML IJ SOLN
INTRAMUSCULAR | Status: DC | PRN
  Administered 2019-05-16: 10 mg via INTRAVENOUS

## 2019-05-16 MED ORDER — ENOXAPARIN SODIUM 30 MG/0.3ML ~~LOC~~ SOLN: 30.0000 mg | SUBCUTANEOUS | Status: DC

## 2019-05-16 MED ORDER — SODIUM CHLORIDE 0.9 % IV SOLN: INTRAVENOUS | Status: DC

## 2019-05-16 MED ORDER — SODIUM CHLORIDE 0.9 % IV SOLN
Freq: Once | INTRAVENOUS | Status: AC
  Administered 2019-05-16: 13:00:00 via INTRAVENOUS

## 2019-05-16 MED ORDER — TAMSULOSIN HCL 0.4 MG PO CAPS
0.4000 mg | ORAL_CAPSULE | Freq: Every day | ORAL | Status: DC
  Administered 2019-05-16 – 2019-05-17 (×2): 0.4 mg via ORAL
  Filled 2019-05-16 (×2): qty 1

## 2019-05-16 MED ORDER — PROPOFOL 10 MG/ML IV BOLUS
INTRAVENOUS | Status: DC | PRN
  Administered 2019-05-16: 150 mg via INTRAVENOUS

## 2019-05-16 MED ORDER — MORPHINE SULFATE (PF) 4 MG/ML IV SOLN: 1.0000 mg | INTRAVENOUS | Status: DC | PRN

## 2019-05-16 MED ORDER — CEFAZOLIN SODIUM-DEXTROSE 2-4 GM/100ML-% IV SOLN
INTRAVENOUS | Status: AC
  Filled 2019-05-16: qty 100

## 2019-05-16 MED ORDER — HYDRALAZINE HCL 20 MG/ML IJ SOLN: 5.0000 mg | Freq: Four times a day (QID) | INTRAMUSCULAR | Status: DC | PRN

## 2019-05-16 MED ORDER — IOHEXOL 300 MG/ML  SOLN
INTRAMUSCULAR | Status: DC | PRN
  Administered 2019-05-16: 17:00:00 3 mL via URETHRAL

## 2019-05-16 MED ORDER — LACTATED RINGERS IV SOLN
INTRAVENOUS | Status: DC
  Administered 2019-05-16: 16:00:00 via INTRAVENOUS

## 2019-05-16 MED ORDER — LISINOPRIL 20 MG PO TABS
20.0000 mg | ORAL_TABLET | Freq: Two times a day (BID) | ORAL | Status: DC
  Administered 2019-05-16: 23:00:00 20 mg via ORAL
  Filled 2019-05-16 (×2): qty 1

## 2019-05-16 SURGICAL SUPPLY — 13 items
BAG URO CATCHER STRL LF (MISCELLANEOUS) ×2 IMPLANT
CATH INTERMIT  6FR 70CM (CATHETERS) ×2 IMPLANT
CLOTH BEACON ORANGE TIMEOUT ST (SAFETY) ×2 IMPLANT
COVER WAND RF STERILE (DRAPES) IMPLANT
GLOVE BIOGEL M STRL SZ7.5 (GLOVE) ×10 IMPLANT
GOWN STRL REUS W/TWL LRG LVL3 (GOWN DISPOSABLE) ×4 IMPLANT
GUIDEWIRE STR DUAL SENSOR (WIRE) ×2 IMPLANT
KIT TURNOVER KIT A (KITS) ×2 IMPLANT
MANIFOLD NEPTUNE II (INSTRUMENTS) ×2 IMPLANT
PACK CYSTO (CUSTOM PROCEDURE TRAY) ×2 IMPLANT
STENT CONTOUR 6FRX26X.038 (STENTS) ×2 IMPLANT
TUBING CONNECTING 10 (TUBING) ×2 IMPLANT
TUBING UROLOGY SET (TUBING) ×2 IMPLANT

## 2019-05-16 NOTE — Anesthesia Procedure Notes (Signed)
Procedure Name: LMA Insertion Date/Time: 05/16/2019 4:52 PM Performed by: Claudia Desanctis, CRNA Pre-anesthesia Checklist: Emergency Drugs available, Patient identified, Suction available and Patient being monitored Patient Re-evaluated:Patient Re-evaluated prior to induction Oxygen Delivery Method: Circle system utilized Preoxygenation: Pre-oxygenation with 100% oxygen Induction Type: IV induction Ventilation: Mask ventilation without difficulty LMA: LMA inserted LMA Size: 4.0 Number of attempts: 1 Placement Confirmation: positive ETCO2 and breath sounds checked- equal and bilateral Tube secured with: Tape Dental Injury: Teeth and Oropharynx as per pre-operative assessment

## 2019-05-16 NOTE — ED Notes (Signed)
Pt sign consent form. Form is at beside and pt in gown

## 2019-05-16 NOTE — Anesthesia Postprocedure Evaluation (Signed)
Anesthesia Post Note  Patient: Derek [name]  Procedure(s) Performed: CYSTOSCOPY WITH RETROGRADE PYELOGRAM/URETERAL STENT PLACEMENT (Right Ureter)     Patient location during evaluation: PACU Anesthesia Type: General Level of consciousness: awake and alert Pain management: pain level controlled Vital Signs Assessment: post-procedure vital signs reviewed and stable Respiratory status: spontaneous breathing, nonlabored ventilation and respiratory function stable Cardiovascular status: blood pressure returned to baseline and stable Postop Assessment: no apparent nausea or vomiting Anesthetic complications: no    Last Vitals:  Vitals:   05/16/19 1730 05/16/19 1745  BP: 136/79 134/74  Pulse: 76 76  Resp: 18 18  Temp:  37 C  SpO2: 95% 93%    Last Pain:  Vitals:   05/16/19 1745  TempSrc:   PainSc: 0-No pain                 Audry Pili

## 2019-05-16 NOTE — ED Notes (Signed)
OR needs rapid covid swab on patient.

## 2019-05-16 NOTE — Op Note (Signed)
Preoperative diagnosis, right ureteral stone possible Neri tract infection Postoperative diagnosis: Right ureteral stone possible urinary tract infection Surgery: Cystoscopy right retrograde ureterogram and insertion of right ureteral stent Surgeon: Dr. Nicki Reaper Mcdiarmid  The patient has the above diagnosis and consented the above procedure.  Extra care was taken with leg positioning.  Preoperative antibiotics given.  75 French cystoscope was utilized.  Penile bulbar membranous urethra normal.  Mild bilobar enlargement of the prostate.  Bladder mucosa and trigone were normal.  No cystitis.  No stone in the bladder  Under fluoroscopic guidance I passed a sensor wire to the mid right ureter followed by a 6 [place] open-ended ureteral catheter removing the wire.  Retrograde ureterogram: Did a gentle retrograde with 4 cc of contrast.  Filling defect in the right upper ureter.  Mild dilation of calyces and pelvis  Sensor wire placed easily curling in the upper pole calyx.  Over the wire I passed a 30 French double-J stent with no string.  It curled nicely in the bladder and pelvis.  X-rays taken.  There was no volcano effect.  Bladder was emptied and patient was taken to recovery

## 2019-05-16 NOTE — Interval H&P Note (Signed)
History and Physical Interval Note:  05/16/2019 4:15 PM  [place]  has presented today for surgery, with the diagnosis of RIGHT URETERAL STONE.  The various methods of treatment have been discussed with the patient and family. After consideration of risks, benefits and other options for treatment, the patient has consented to  Procedure(s): CYSTOSCOPY WITH RETROGRADE PYELOGRAM/URETERAL STENT PLACEMENT (Right) as a surgical intervention.  The patient's history has been reviewed, patient examined, no change in status, stable for surgery.  I have reviewed the patient's chart and labs.  Questions were answered to the patient's satisfaction.     Carinna Newhart A Genifer Lazenby

## 2019-05-16 NOTE — ED Provider Notes (Signed)
[place] DEPT Provider Note   CSN: [id number] Arrival date & time: 05/16/19  1137     History   Chief Complaint Chief Complaint  Patient presents with  . Back Pain    HPI LONIE FOLAND is a 73 y.o. male.     The history is provided by the patient and medical records. No language interpreter was used.  Back Pain  KENDERICK KETELSEN is a 73 y.o. male who presents to the Emergency Department complaining of flank pain. He presents to the emergency department by transfer from [facility] for evaluation of flank pain. He has a history of kidney stones and began to feel flank pain on Sunday. He has associated lower abdominal discomfort. He noted a fever to 99.2 at home. He denies any nausea, vomiting, dysuria, chest pain, coughing. In the [facility] emergency department he had a CT abdomen pelvis obtained that demonstrates a .9 cm proximal right ureteral stone. He was found to be febrile with a temperature of 100.1 in the emergency department. Chemistry is significant for sodium 133, potassium 4.3, chloride 95, CO2 22.7, BUN 31, creatinine 2.06, glucose 159, magnesium 1.8, WBC 15, hemoglobin 12.7, platelet 258. Urinalysis negative leukocyte esterase, 0 to 5 RBC, 0 to 5 epithelial, occasional bacteria. He was treated in the emergency department with fentanyl 50 g IV, 1 L lactated ringers and 4 mg ondansetron. He was transferred to the [facility] emergency department for evaluation by urology for concern for UTI with obstructing stone. He does complain of ongoing back pain but otherwise is feeling improved. Past Medical History:  Diagnosis Date  . Chronic kidney disease   . Diabetes mellitus without complication ([place])   . Hyperlipidemia   . Hypertension     Patient Active Problem List   Diagnosis Date Noted  . Chronic kidney disease, stage III (moderate) ([place]) 03/20/2019  . Diabetes mellitus, type II ([place]) 03/20/2019  . GERD (gastroesophageal reflux  disease) 03/20/2019  . HYPERLIPIDEMIA-MIXED 03/06/2009  . HYPERTENSION, UNSPECIFIED 03/06/2009  . TOBACCO ABUSE 03/03/2009  . CAD 03/03/2009  . OSTEOARTHROS UNSPEC GEN/LOC OTH SPEC SITES 03/03/2009          Home Medications    Prior to Admission medications   Medication Sig Start Date End Date Taking? Authorizing Provider  aspirin EC 81 MG tablet Take 81 mg by mouth daily.    [provider]  hydrochlorothiazide (HYDRODIURIL) 25 MG tablet Take 12.5 mg by mouth daily.    [provider]  lisinopril (ZESTRIL) 20 MG tablet Take 20 mg by mouth daily.    [provider]  metFORMIN (GLUCOPHAGE) 500 MG tablet Take 1,000 mg by mouth 2 (two) times daily with a meal.    [provider]  metoprolol tartrate (LOPRESSOR) 50 MG tablet Take 25 mg by mouth 2 (two) times daily.    [provider]  pravastatin (PRAVACHOL) 40 MG tablet Take 40 mg by mouth daily.    [provider]  tamsulosin (FLOMAX) 0.4 MG CAPS capsule Take 0.4 mg by mouth.    [provider]  traMADol (ULTRAM) 50 MG tablet Take by mouth 2 (two) times daily.    [provider]    Family History Family History  Problem Relation Age of Onset  . Thyroid disease Mother   . Heart attack Father     Social History Social History   Tobacco Use  . Smoking status: Former Research scientist (life sciences)  . Smokeless tobacco: Never Used  Substance Use  Topics  . Alcohol use: Not on file  . Drug use: Not on file     Allergies   Penicillins   Review of Systems Review of Systems  Musculoskeletal: Positive for back pain.  All other systems reviewed and are negative.    Physical Exam Updated Vital Signs BP (!) 150/79 (BP Location: Right Arm)   Pulse 71   Temp 98.2 F (36.8 C) (Oral)   Resp 18   Ht 5\' 9"  (1.753 m)   Wt 88 kg   SpO2 96%   BMI 28.65 kg/m   Physical Exam Vitals signs and nursing note reviewed.  Constitutional:      Appearance: He is well-developed.   HENT:     Head: Normocephalic and atraumatic.  Cardiovascular:     Rate and Rhythm: Normal rate and regular rhythm.     Heart sounds: No murmur.  Pulmonary:     Effort: Pulmonary effort is normal. No respiratory distress.     Breath sounds: Normal breath sounds.  Abdominal:     Palpations: Abdomen is soft.     Tenderness: There is no guarding or rebound.     Comments: Moderate lower abdominal, suprapubic tenderness  Musculoskeletal:        General: No swelling or tenderness.  Skin:    General: Skin is warm and dry.  Neurological:     Mental Status: He is alert and oriented to person, place, and time.  Psychiatric:        Behavior: Behavior normal.      ED Treatments / Results  Labs (all labs ordered are listed, but only abnormal results are displayed) Labs Reviewed - No data to display  EKG None  Radiology No results found.  Procedures Procedures (including critical care time)  Medications Ordered in ED Medications - No data to display   Initial Impression / Assessment and Plan / ED Course  I have reviewed the triage vital signs and the nursing notes.  Pertinent labs & imaging results that were available during my care of the patient were reviewed by me and considered in my medical decision making (see chart for details).       patient presents from [facility] rocking him for evaluation of flank pain. He has a 9 mm ureteral stone with concern for possible urinary tract infection as well. He was treated with antibiotics prior to ED arrival. On ED assessment he is resting comfortably and in no acute distress. Discussed with Dr. Matilde Sprang with urology, who will see the patient. Hospitalist consulted for admission given AKI.   Final Clinical Impressions(s) / ED Diagnoses   Final diagnoses:  None    ED Discharge Orders    None       Quintella Reichert, MD 05/16/19 1415

## 2019-05-16 NOTE — Transfer of Care (Signed)
Immediate Anesthesia Transfer of Care Note  Patient: Derek [name]  Procedure(s) Performed: CYSTOSCOPY WITH RETROGRADE PYELOGRAM/URETERAL STENT PLACEMENT (Right Ureter)  Patient Location: PACU  Anesthesia Type:General  Level of Consciousness: awake, alert , oriented and patient cooperative  Airway & Oxygen Therapy: Patient Spontanous Breathing and Patient connected to face mask  Post-op Assessment: Report given to RN and Post -op Vital signs reviewed and stable  Post vital signs: Reviewed and stable  Last Vitals:  Vitals Value Taken Time  BP 142/82 05/16/19 1721  Temp    Pulse    Resp 14 05/16/19 1722  SpO2    Vitals shown include unvalidated device data.  Last Pain:  Vitals:   05/16/19 1602  TempSrc:   PainSc: 0-No pain         Complications: No apparent anesthesia complications

## 2019-05-16 NOTE — H&P (Addendum)
History and Physical    Derek [name] [id number] DOB: August 28, 1946 DOA: 05/16/2019  PCP: Theresa Duty, PA  Patient coming from: Home   I have personally briefly reviewed patient's old medical records in [place]  Chief Complaint: transfer from [place] for right obstructing renal stone.   HPI: Derek [name] is a 73 y.o. male with medical history significant of hypertension, hyperlipidemia, diabetes, history of renal stones in the past presents to [facility]  ED with right-sided flank pain.  Patient denies any nausea, vomiting, dysuria, fever or chills he denies any cough or shortness of breath or hematuria or hematochezia.  At the [facility] ED she he had a CT and abdomen and pelvis done which showed 9 mm ureteral stone on the right.  Lab work revealed sodium of 133, potassium of 4.3, BUN of 31 and creatinine of 2.06, WBC count of 15,000, hemoglobin of 12.7, platelets of 258.  Urinalysis shows occasional bacteria and negative leukocytes.  He was transferred to [facility] ED for further evaluation and management of the obstructing right renal stone.  Urology consulted and he was scheduled for cystoscopy later this evening.  He was referred to medical service for admission.    Review of Systems:  All others reviewed and are negative.    Past Medical History:  Diagnosis Date  . Chronic kidney disease   . Diabetes mellitus without complication ([place])   . Hyperlipidemia   . Hypertension     Social history  reports that he has quit smoking. He has never used smokeless tobacco. No history on file for alcohol and drug.  No Known Allergies  Family History  Problem Relation Age of Onset  . Thyroid disease Mother   . Heart attack Father     Family history reviewed and not pertinent   Prior to Admission medications   Medication Sig Start Date End Date Taking? Authorizing Provider  aspirin EC 81 MG tablet Take 81 mg by mouth daily.   Yes [provider]   hydrochlorothiazide (HYDRODIURIL) 12.5 MG tablet Take 12.5 mg by mouth daily.    Yes [provider]  lisinopril (ZESTRIL) 20 MG tablet Take 20 mg by mouth 2 (two) times daily.    Yes [provider]  metFORMIN (GLUCOPHAGE) 500 MG tablet Take 1,000 mg by mouth 2 (two) times daily with a meal.   Yes [provider]  metoprolol tartrate (LOPRESSOR) 50 MG tablet Take 25 mg by mouth 2 (two) times daily.   Yes [provider]  pravastatin (PRAVACHOL) 40 MG tablet Take 40 mg by mouth daily.   Yes [provider]  tamsulosin (FLOMAX) 0.4 MG CAPS capsule Take 0.4 mg by mouth.   Yes [provider]  traMADol (ULTRAM) 50 MG tablet Take by mouth 2 (two) times daily.   Yes [provider]    Physical Exam:  Constitutional: NAD, calm, comfortable, not in distress.  Vitals:   05/16/19 1400 05/16/19 1430 05/16/19 1500 05/16/19 1518  BP: (!) 153/80 (!) 145/68 126/69 (!) 159/73  Pulse: 67 67 69 71  Resp: 15 17    Temp:    98.6 F (37 C)  TempSrc:    Oral  SpO2: 98% 97% 96%   Weight:      Height:       Eyes: PERRL, lids and conjunctivae normal ENMT: Mucous membranes are moist. Posterior pharynx clear of any exudate or lesions.Normal dentition.  Neck: normal, supple, no masses, no thyromegaly Respiratory:  clear to auscultation bilaterally, no wheezing, no crackles. Normal respiratory effort. No accessory muscle use.  Cardiovascular: Regular rate and rhythm, no murmurs / rubs / gallops. No extremity edema.  Abdomen: no tenderness, no masses palpated. No hepatosplenomegaly. Bowel sounds positive.  Musculoskeletal: no clubbing / cyanosis. Skin: no rashes, lesions, ulcers. No induration Neurologic: CN 2-12 grossly intact. Sensation intact, DTR normal. Strength 5/5 in all 4.  Psychiatric: Normal judgment and insight. Alert and oriented x 3. Normal mood.    Labs on Admission: I have personally reviewed following labs and imaging studies   CBC: No results for input(s): WBC, NEUTROABS, HGB, HCT, MCV, PLT in the last 168 hours. Basic Metabolic Panel: No results for input(s): NA, K, CL, CO2, GLUCOSE, BUN, CREATININE, CALCIUM, MG, PHOS in the last 168 hours. GFR: CrCl cannot be calculated (Patient's most recent lab result is older than the maximum 21 days allowed.). Liver Function Tests: No results for input(s): AST, ALT, ALKPHOS, BILITOT, PROT, ALBUMIN in the last 168 hours. No results for input(s): LIPASE, AMYLASE in the last 168 hours. No results for input(s): AMMONIA in the last 168 hours. Coagulation Profile: No results for input(s): INR, PROTIME in the last 168 hours. Cardiac Enzymes: No results for input(s): CKTOTAL, CKMB, CKMBINDEX, TROPONINI in the last 168 hours. BNP (last 3 results) No results for input(s): PROBNP in the last 8760 hours. HbA1C: No results for input(s): HGBA1C in the last 72 hours. CBG: No results for input(s): GLUCAP in the last 168 hours. Lipid Profile: No results for input(s): CHOL, HDL, LDLCALC, TRIG, CHOLHDL, LDLDIRECT in the last 72 hours. Thyroid Function Tests: No results for input(s): TSH, T4TOTAL, FREET4, T3FREE, THYROIDAB in the last 72 hours. Anemia Panel: No results for input(s): VITAMINB12, FOLATE, FERRITIN, TIBC, IRON, RETICCTPCT in the last 72 hours. Urine analysis: No results found for: COLORURINE, APPEARANCEUR, LABSPEC, PHURINE, GLUCOSEU, HGBUR, BILIRUBINUR, KETONESUR, PROTEINUR, UROBILINOGEN, NITRITE, LEUKOCYTESUR  Radiological Exams on Admission: No results found.  EKG: Independently reviewed. Sinus rhythm.   Assessment/Plan Active Problems:   AKI (acute kidney injury) ([place])   Right proximal ureteral stone Admit for  urological intervention. N.p.o., hydration with IV normal saline at 75 mL/h. Pain control with IV morphine and patient was started on IV Rocephin empirically. Appreciate urology recommendations.   AKI There is a mention of chronic kidney disease in  his past medical history but patient denies any knowledge of it. AKI probably secondary to right renal stone versus poor renal perfusion secondary to dehydration. Hold home oral hydrochlorothiazide and lisinopril. Gently hydrate the patient, follow urine cultures.  Get urine sodium and urine creatinine.  No mention of hydronephrosis on the CT abdomen pelvis. Repeat renal parameters in the morning.   Essential hypertension Blood pressure parameters slightly high probably secondary to pain. Resume PRN IV hydralazine for now.   Diabetes mellitus Start the patient on sliding scale insulin. Get hemoglobin A1c.  Severity of Illness: The appropriate patient status for this patient is OBSERVATION. Observation status is judged to be reasonable and necessary in order to provide the required intensity of service to ensure the patient's safety. The patient's presenting symptoms, physical exam findings, and initial radiographic and laboratory data in the context of their medical condition is felt to place them at decreased risk for further clinical deterioration. Furthermore, it is anticipated that the patient will be medically stable for discharge from the hospital within 2 midnights of admission. The following factors support the patient status of observation.   " The patient's presenting  symptoms include flank pain  " The initial radiographic and laboratory data are showing right renal stone.      DVT prophylaxis: Lovenox Code Status: Full code Family Communication: At bedside Disposition Plan: Pending further evaluation Consults called: Urology Admission status: Medical floor/obs   Hosie Poisson MD [facility] Pager [phone]  If 7PM-7AM, please contact night-coverage [email or web address] Password [email or web address]  05/16/2019, 3:58 PM

## 2019-05-16 NOTE — ED Triage Notes (Signed)
Pt arrived [facility] from [facility] ED  C/o Kidneys stone per EMS 17 mm 9 mm stones. Ems gave 50 Mcg fentlyn left hand 20G. No allergies. Hx MI, diabetes, HTN. Glucose 159. Vitals stable

## 2019-05-16 NOTE — Anesthesia Preprocedure Evaluation (Addendum)
Anesthesia Evaluation  Patient identified by MRN, date of birth, ID band Patient awake    Reviewed: Allergy & Precautions, NPO status , Patient's Chart, lab work & pertinent test results  History of Anesthesia Complications Negative for: history of anesthetic complications  Airway Mallampati: III  TM Distance: >3 FB Neck ROM: Full    Dental  (+) Chipped, Poor Dentition, Missing, Dental Advisory Given   Pulmonary former smoker,    Pulmonary exam normal        Cardiovascular hypertension, Pt. on medications and Pt. on home beta blockers + CAD and + CABG  Normal cardiovascular exam     Neuro/Psych negative neurological ROS  negative psych ROS   GI/Hepatic Neg liver ROS, GERD  Controlled,  Endo/Other  diabetes, Type 2, Oral Hypoglycemic Agents  Renal/GU CRFRenal disease     Musculoskeletal  (+) Arthritis ,   Abdominal   Peds  Hematology negative hematology ROS (+)   Anesthesia Other Findings   Reproductive/Obstetrics                            Anesthesia Physical Anesthesia Plan  ASA: III  Anesthesia Plan: General   Post-op Pain Management:    Induction: Intravenous  PONV Risk Score and Plan: 3 and Treatment may vary due to age or medical condition, Ondansetron and Dexamethasone  Airway Management Planned: LMA  Additional Equipment: None  Intra-op Plan:   Post-operative Plan: Extubation in OR  Informed Consent: I have reviewed the patients History and Physical, chart, labs and discussed the procedure including the risks, benefits and alternatives for the proposed anesthesia with the patient or authorized representative who has indicated his/her understanding and acceptance.     Dental advisory given  Plan Discussed with: CRNA and Anesthesiologist  Anesthesia Plan Comments:        Anesthesia Quick Evaluation

## 2019-05-16 NOTE — Consult Note (Signed)
Urology Consult  Referring physician: Rudean Haskell Reason for referral: ureteral stone  Chief Complaint: ureteral stone  History of Present Illness: Patient transferred from [facility] rocking him with possibly early urosepsis and a ureteral stone.  He was found to have a 9 mm right ureteral stone and a temperature of 100.1 in the emergency department.  His serum creatinine had almost doubled to 2.06.  He was given fluid and antibiotics and transferred.  He has some medical comorbidities noted  Based upon a history of the following I believe the patient had nonobstructing stones in both kidneys.  Patient has a good fall and gets up once at night.  Has passed kidney stones many years ago.  No history of bladder surgery or bladder infections  I reviewed the CT scan and he has moderate stone burden nonobstructing in both kidneys.  He has a 9 mm stone in the proximal right ureter.  One stone on the left side nonobstructing is approximately 1.5 cm in size  Modifying factors: There are no other modifying factors  Associated signs and symptoms: There are no other associated signs and symptoms Aggravating and relieving factors: There are no other aggravating or relieving factors Severity: Moderate Duration: Persistent  Past Medical History:  Diagnosis Date  . Chronic kidney disease   . Diabetes mellitus without complication ([place])   . Hyperlipidemia   . Hypertension      Medications: I have reviewed the patient's current medications. Allergies:  Allergies  Allergen Reactions  . Penicillins     Family History  Problem Relation Age of Onset  . Thyroid disease Mother   . Heart attack Father    Social History:  reports that he has quit smoking. He has never used smokeless tobacco. No history on file for alcohol and drug.  ROS: All systems are reviewed and negative except as noted. Rest negative  Physical Exam:  Vital signs in last 24 hours: Temp:  [98.2 F (36.8 C)] 98.2 F (36.8 C) (08/26  1206) Pulse Rate:  [66-77] 66 (08/26 1230) Resp:  [14-18] 18 (08/26 1206) BP: (146-151)/(74-80) 146/74 (08/26 1230) SpO2:  [96 %-100 %] 96 % (08/26 1230) Weight:  [88 kg] 88 kg (08/26 1211)  Cardiovascular: Skin warm; not flushed Respiratory: Breaths quiet; no shortness of breath Abdomen: No masses Neurological: Normal sensation to touch Musculoskeletal: Normal motor function arms and legs Lymphatics: No inguinal adenopathy Skin: No rashes Genitourinary: No CVA tenderness; nontoxic  Laboratory Data:  No results found for this or any previous visit (from the past 72 hour(s)). No results found for this or any previous visit (from the past 240 hour(s)). Creatinine: No results for input(s): CREATININE in the last 168 hours.  Xrays: See report/chart As noted above  Impression/Assessment:  Patient has right ureteral stone.  He has bilateral stone burden.  He had early signs of possible fever or infection.  I drew a picture.  I went through pros and cons and risk of a right ureteral stent with failure rates complications and sequela even with percutaneous tube.  He would at least day and overnight because of the question of a fever.  Medicine is admitting him.  Stent symptoms described.  Future lithotripsy mention  Plan:  Keep n.p.o. and consent was signed  [place] 05/16/2019, 1:28 PM

## 2019-05-16 NOTE — H&P (View-Only) (Signed)
Urology Consult  Referring physician: Rudean Haskell Reason for referral: ureteral stone  Chief Complaint: ureteral stone  History of Present Illness: Patient transferred from [facility] rocking him with possibly early urosepsis and a ureteral stone.  He was found to have a 9 mm right ureteral stone and a temperature of 100.1 in the emergency department.  His serum creatinine had almost doubled to 2.06.  He was given fluid and antibiotics and transferred.  He has some medical comorbidities noted  Based upon a history of the following I believe the patient had nonobstructing stones in both kidneys.  Patient has a good fall and gets up once at night.  Has passed kidney stones many years ago.  No history of bladder surgery or bladder infections  I reviewed the CT scan and he has moderate stone burden nonobstructing in both kidneys.  He has a 9 mm stone in the proximal right ureter.  One stone on the left side nonobstructing is approximately 1.5 cm in size  Modifying factors: There are no other modifying factors  Associated signs and symptoms: There are no other associated signs and symptoms Aggravating and relieving factors: There are no other aggravating or relieving factors Severity: Moderate Duration: Persistent  Past Medical History:  Diagnosis Date  . Chronic kidney disease   . Diabetes mellitus without complication ([place])   . Hyperlipidemia   . Hypertension      Medications: I have reviewed the patient's current medications. Allergies:  Allergies  Allergen Reactions  . Penicillins     Family History  Problem Relation Age of Onset  . Thyroid disease Mother   . Heart attack Father    Social History:  reports that he has quit smoking. He has never used smokeless tobacco. No history on file for alcohol and drug.  ROS: All systems are reviewed and negative except as noted. Rest negative  Physical Exam:  Vital signs in last 24 hours: Temp:  [98.2 F (36.8 C)] 98.2 F (36.8 C) (08/26  1206) Pulse Rate:  [66-77] 66 (08/26 1230) Resp:  [14-18] 18 (08/26 1206) BP: (146-151)/(74-80) 146/74 (08/26 1230) SpO2:  [96 %-100 %] 96 % (08/26 1230) Weight:  [88 kg] 88 kg (08/26 1211)  Cardiovascular: Skin warm; not flushed Respiratory: Breaths quiet; no shortness of breath Abdomen: No masses Neurological: Normal sensation to touch Musculoskeletal: Normal motor function arms and legs Lymphatics: No inguinal adenopathy Skin: No rashes Genitourinary: No CVA tenderness; nontoxic  Laboratory Data:  No results found for this or any previous visit (from the past 72 hour(s)). No results found for this or any previous visit (from the past 240 hour(s)). Creatinine: No results for input(s): CREATININE in the last 168 hours.  Xrays: See report/chart As noted above  Impression/Assessment:  Patient has right ureteral stone.  He has bilateral stone burden.  He had early signs of possible fever or infection.  I drew a picture.  I went through pros and cons and risk of a right ureteral stent with failure rates complications and sequela even with percutaneous tube.  He would at least day and overnight because of the question of a fever.  Medicine is admitting him.  Stent symptoms described.  Future lithotripsy mention  Plan:  Keep n.p.o. and consent was signed  [place] 05/16/2019, 1:28 PM

## 2019-05-16 NOTE — ED Notes (Signed)
ED TO INPATIENT HANDOFF REPORT  Name/Age/Gender Rayvon Char Alguire 73 y.o. male  Code Status   Home/SNF/Other Home  Chief Complaint back pain kidney stones  Level of Care/Admitting Diagnosis ED Disposition    ED Disposition Condition [facility] Area: [place] [[id number]]  Level of Care: Med-Surg [16]  Covid Evaluation: Asymptomatic Screening Protocol (No Symptoms)  Diagnosis: AKI (acute kidney injury) ([place]) [id number]  Admitting Physician: Hosie Poisson [[id number]]  Attending Physician: Hosie Poisson [[id number]]  PT Class (Do Not Modify): Observation [104]  PT Acc Code (Do Not Modify): Observation [10022]       Medical History Past Medical History:  Diagnosis Date  . Chronic kidney disease   . Diabetes mellitus without complication ([place])   . Hyperlipidemia   . Hypertension     Allergies No Known Allergies  IV Location/Drains/Wounds Patient Lines/Drains/Airways Status   Active Line/Drains/Airways    Name:   Placement date:   Placement time:   Site:   Days:   Peripheral IV 05/16/19 Anterior;Left Hand   05/16/19    1210    Hand   less than 1          Labs/Imaging Results for orders placed or performed during the hospital encounter of 05/16/19 (from the past 48 hour(s))  SARS Coronavirus 2 [facility] order, Performed in [facility] hospital lab) Nasopharyngeal Nasopharyngeal Swab     Status: None   Collection Time: 05/16/19 12:45 PM   Specimen: Nasopharyngeal Swab  Result Value Ref Range   SARS Coronavirus 2 NEGATIVE NEGATIVE    Comment: (NOTE) If result is NEGATIVE SARS-CoV-2 target nucleic acids are NOT DETECTED. The SARS-CoV-2 RNA is generally detectable in upper and lower  respiratory specimens during the acute phase of infection. The lowest  concentration of SARS-CoV-2 viral copies this assay can detect is 250  copies / mL. A negative result does not preclude SARS-CoV-2 infection  and should not be used as the sole basis for  treatment or other  patient management decisions.  A negative result may occur with  improper specimen collection / handling, submission of specimen other  than nasopharyngeal swab, presence of viral mutation(s) within the  areas targeted by this assay, and inadequate number of viral copies  (<250 copies / mL). A negative result must be combined with clinical  observations, patient history, and epidemiological information. If result is POSITIVE SARS-CoV-2 target nucleic acids are DETECTED. The SARS-CoV-2 RNA is generally detectable in upper and lower  respiratory specimens dur ing the acute phase of infection.  Positive  results are indicative of active infection with SARS-CoV-2.  Clinical  correlation with patient history and other diagnostic information is  necessary to determine patient infection status.  Positive results do  not rule out bacterial infection or co-infection with other viruses. If result is PRESUMPTIVE POSTIVE SARS-CoV-2 nucleic acids MAY BE PRESENT.   A presumptive positive result was obtained on the submitted specimen  and confirmed on repeat testing.  While 2019 novel coronavirus  (SARS-CoV-2) nucleic acids may be present in the submitted sample  additional confirmatory testing may be necessary for epidemiological  and / or clinical management purposes  to differentiate between  SARS-CoV-2 and other Sarbecovirus currently known to infect humans.  If clinically indicated additional testing with an alternate test  methodology [phone]) is advised. The SARS-CoV-2 RNA is generally  detectable in upper and lower respiratory sp ecimens during the acute  phase of infection. The expected result is Negative. Fact  Sheet for Patients:  StrictlyIdeas.no Fact Sheet for Healthcare Providers: BankingDealers.co.za This test is not yet approved or cleared by the [place] FDA and has been authorized for detection and/or  diagnosis of SARS-CoV-2 by FDA under an Emergency Use Authorization (EUA).  This EUA will remain in effect (meaning this test can be used) for the duration of the COVID-19 declaration under Section 564(b)(1) of the Act, 21 U.S.C. section 360bbb-3(b)(1), unless the authorization is terminated or revoked sooner. Performed at [facility], [place] [address], [place], [place] [address]    No results found.  Pending Labs Unresulted Labs (From admission, onward)   None      Vitals/Pain Today's Vitals   05/16/19 1230 05/16/19 1330 05/16/19 1400 05/16/19 1430  BP: (!) 146/74 (!) 156/77 (!) 153/80 (!) 145/68  Pulse: 66 66 67 67  Resp:  15 15 17   Temp:      TempSrc:      SpO2: 96% 97% 98% 97%  Weight:      Height:      PainSc:        Isolation Precautions No active isolations  Medications Medications  0.9 %  sodium chloride infusion ( Intravenous New Bag/Given 05/16/19 1322)    Mobility walks

## 2019-05-17 ENCOUNTER — Encounter (HOSPITAL_COMMUNITY): Payer: Self-pay | Admitting: Urology

## 2019-05-17 DIAGNOSIS — Z7984 Long term (current) use of oral hypoglycemic drugs: Secondary | ICD-10-CM | POA: Diagnosis not present

## 2019-05-17 DIAGNOSIS — N183 Chronic kidney disease, stage 3 (moderate): Secondary | ICD-10-CM

## 2019-05-17 DIAGNOSIS — Z880 Allergy status to penicillin: Secondary | ICD-10-CM | POA: Diagnosis not present

## 2019-05-17 DIAGNOSIS — Z7982 Long term (current) use of aspirin: Secondary | ICD-10-CM | POA: Diagnosis not present

## 2019-05-17 DIAGNOSIS — Z87891 Personal history of nicotine dependence: Secondary | ICD-10-CM | POA: Diagnosis not present

## 2019-05-17 DIAGNOSIS — Z20828 Contact with and (suspected) exposure to other viral communicable diseases: Secondary | ICD-10-CM | POA: Diagnosis present

## 2019-05-17 DIAGNOSIS — Z8249 Family history of ischemic heart disease and other diseases of the circulatory system: Secondary | ICD-10-CM | POA: Diagnosis not present

## 2019-05-17 DIAGNOSIS — E860 Dehydration: Secondary | ICD-10-CM | POA: Diagnosis present

## 2019-05-17 DIAGNOSIS — E1100 Type 2 diabetes mellitus with hyperosmolarity without nonketotic hyperglycemic-hyperosmolar coma (NKHHC): Secondary | ICD-10-CM

## 2019-05-17 DIAGNOSIS — A419 Sepsis, unspecified organism: Secondary | ICD-10-CM | POA: Diagnosis present

## 2019-05-17 DIAGNOSIS — E1165 Type 2 diabetes mellitus with hyperglycemia: Secondary | ICD-10-CM | POA: Diagnosis present

## 2019-05-17 DIAGNOSIS — E875 Hyperkalemia: Secondary | ICD-10-CM | POA: Diagnosis present

## 2019-05-17 DIAGNOSIS — N201 Calculus of ureter: Secondary | ICD-10-CM | POA: Diagnosis not present

## 2019-05-17 DIAGNOSIS — Z87442 Personal history of urinary calculi: Secondary | ICD-10-CM | POA: Diagnosis not present

## 2019-05-17 DIAGNOSIS — Z79899 Other long term (current) drug therapy: Secondary | ICD-10-CM | POA: Diagnosis not present

## 2019-05-17 DIAGNOSIS — N179 Acute kidney failure, unspecified: Secondary | ICD-10-CM | POA: Diagnosis not present

## 2019-05-17 DIAGNOSIS — Z79891 Long term (current) use of opiate analgesic: Secondary | ICD-10-CM | POA: Diagnosis not present

## 2019-05-17 DIAGNOSIS — N202 Calculus of kidney with calculus of ureter: Secondary | ICD-10-CM | POA: Diagnosis present

## 2019-05-17 DIAGNOSIS — N200 Calculus of kidney: Secondary | ICD-10-CM | POA: Diagnosis not present

## 2019-05-17 DIAGNOSIS — Z8349 Family history of other endocrine, nutritional and metabolic diseases: Secondary | ICD-10-CM | POA: Diagnosis not present

## 2019-05-17 DIAGNOSIS — E785 Hyperlipidemia, unspecified: Secondary | ICD-10-CM | POA: Diagnosis present

## 2019-05-17 DIAGNOSIS — I10 Essential (primary) hypertension: Secondary | ICD-10-CM

## 2019-05-17 DIAGNOSIS — E1122 Type 2 diabetes mellitus with diabetic chronic kidney disease: Secondary | ICD-10-CM | POA: Diagnosis present

## 2019-05-17 DIAGNOSIS — I129 Hypertensive chronic kidney disease with stage 1 through stage 4 chronic kidney disease, or unspecified chronic kidney disease: Secondary | ICD-10-CM | POA: Diagnosis present

## 2019-05-17 LAB — BASIC METABOLIC PANEL
Anion gap: 11 (ref 5–15)
BUN: 29 mg/dL — ABNORMAL HIGH (ref 8–23)
CO2: 23 mmol/L (ref 22–32)
Calcium: 8.6 mg/dL — ABNORMAL LOW (ref 8.9–10.3)
Chloride: 100 mmol/L (ref 98–111)
Creatinine, Ser: 1.61 mg/dL — ABNORMAL HIGH (ref 0.61–1.24)
GFR calc Af Amer: 49 mL/min — ABNORMAL LOW (ref >60)
GFR calc non Af Amer: 43 mL/min — ABNORMAL LOW (ref >60)
Glucose, Bld: 187 mg/dL — ABNORMAL HIGH (ref 70–99)
Potassium: 5.3 mmol/L — ABNORMAL HIGH (ref 3.5–5.1)
Sodium: 134 mmol/L — ABNORMAL LOW (ref 135–145)

## 2019-05-17 LAB — URINALYSIS, ROUTINE W REFLEX MICROSCOPIC
Bacteria, UA: NONE SEEN
Bilirubin Urine: NEGATIVE
Glucose, UA: 50 mg/dL — AB
Ketones, ur: NEGATIVE mg/dL
Nitrite: NEGATIVE
Protein, ur: 30 mg/dL — AB
RBC / HPF: >50 RBC/hpf — ABNORMAL HIGH (ref 0–5)
Specific Gravity, Urine: 1.014 (ref 1.005–1.030)
pH: 6 (ref 5.0–8.0)

## 2019-05-17 LAB — GLUCOSE, CAPILLARY
Glucose-Capillary: 177 mg/dL — ABNORMAL HIGH (ref 70–99)
Glucose-Capillary: 180 mg/dL — ABNORMAL HIGH (ref 70–99)
Glucose-Capillary: 117 mg/dL — ABNORMAL HIGH (ref 70–99)
Glucose-Capillary: 139 mg/dL — ABNORMAL HIGH (ref 70–99)

## 2019-05-17 LAB — SODIUM, URINE, RANDOM: Sodium, Ur: 54 mmol/L

## 2019-05-17 LAB — CBC
HCT: 36 % — ABNORMAL LOW (ref 39.0–52.0)
Hemoglobin: 11.7 g/dL — ABNORMAL LOW (ref 13.0–17.0)
MCH: 32.2 pg (ref 26.0–34.0)
MCHC: 32.5 g/dL (ref 30.0–36.0)
MCV: 99.2 fL (ref 80.0–100.0)
Platelets: 272 10*3/uL (ref 150–400)
RBC: 3.63 MIL/uL — ABNORMAL LOW (ref 4.22–5.81)
RDW: 11.9 % (ref 11.5–15.5)
WBC: 9.2 10*3/uL (ref 4.0–10.5)
nRBC: 0 % (ref 0.0–0.2)

## 2019-05-17 LAB — HIV ANTIBODY (ROUTINE TESTING W REFLEX): HIV Screen 4th Generation wRfx: NONREACTIVE

## 2019-05-17 LAB — CREATININE, URINE, RANDOM: Creatinine, Urine: 93.02 mg/dL

## 2019-05-17 MED ORDER — SODIUM ZIRCONIUM CYCLOSILICATE 10 G PO PACK
10.0000 g | PACK | Freq: Once | ORAL | Status: AC
  Administered 2019-05-17: 13:00:00 10 g via ORAL
  Filled 2019-05-17: qty 1

## 2019-05-17 MED ORDER — AMLODIPINE BESYLATE 5 MG PO TABS
5.0000 mg | ORAL_TABLET | Freq: Every day | ORAL | Status: DC
  Administered 2019-05-17 – 2019-05-18 (×2): 5 mg via ORAL
  Filled 2019-05-17 (×2): qty 1

## 2019-05-17 MED ORDER — ENOXAPARIN SODIUM 40 MG/0.4ML ~~LOC~~ SOLN
40.0000 mg | SUBCUTANEOUS | Status: DC
  Administered 2019-05-17 – 2019-05-18 (×2): 40 mg via SUBCUTANEOUS
  Filled 2019-05-17 (×2): qty 0.4

## 2019-05-17 NOTE — Plan of Care (Addendum)
Pt denies pain. Pt's labs are improving except for his BUN, 29>32

## 2019-05-17 NOTE — Progress Notes (Signed)
[facility]                                                                              Patient Demographics  Derek [name], is a 73 y.o. male, DOB - 1946-04-16, [id number]  Admit date - 05/16/2019   Admitting Physician Derek Poisson, MD  Outpatient Primary MD for the patient is [name], Alyssa, PA  Outpatient specialists:   LOS - 0  days   Medical records reviewed and are as summarized below:    Chief Complaint  Patient presents with  . Back Pain       Brief summary   Patient is a 73 year old male with hypertension, hyperlipidemia, diabetes, history of renal stones in the past presented to [name] ED with right-sided flank pain.  Patient reported no nausea vomiting, dysuria, fever or chills.  At [name] ED, had a CT abdomen and pelvis which showed 9 mm ureteral stone on the right.  Lab work showed sodium 133, potassium 4.3, creatinine 2.0.  Baseline 0.8.  WBC count 15, hemoglobin 12.7, platelets 258.  UA showed occasional bacteria negative leukocytes.  Patient was transferred to [name] on ED for further evaluation.   Assessment & Plan    Principal Problem: Right proximal ureteral stone, sepsis, present on admission -Patient presented to [name] with fever of 100.1 F in ED, creatinine 2.0, leukocytosis, acute kidney injury, source likely due to infected right proximal ureteral stone.  UA was not very conclusive for UTI. -Urology was consulted, patient was placed on n.p.o. status, IV fluid hydration, pain management -Patient underwent cystoscopy and right ureteral stent placement -Reviewed urology note, looked infected during presentation, recommended broad-spectrum antibiotics -Unable to contact [name] micro lab, will repeat UA and culture although patient has received antibiotics now -Continue IV Rocephin, will transition to oral Ceftin oral Augmentin in a.m. -Continue Flomax  Active Problems: Acute kidney injury,  -Unclear if  patient has any history of CKD, creatinine 2.0 at the time of presentation to [name], unknown baseline, last creatinine from 2010 was 0.8 -Continue IV fluid hydration, creatinine improving 1.6 -Hold HCTZ, lisinopril  Hyperkalemia -Potassium 5.3 -Hold ACE inhibitor -Lokelma 10 g x 1    Essential hypertension -BP elevated, continue metoprolol, added Norvasc 5 mg daily -Hold old HCTZ, lisinopril    Diabetes mellitus, type II ([name]) uncontrolled with hyperglycemia, NIDDM -Continue sliding scale insulin.  Hold metformin  A1c 6.6  Code Status: Full code DVT Prophylaxis: Lovenox Family Communication: Discussed all imaging results, lab results, explained to the patient   Disposition Plan: Creatinine currently not at baseline, remains inpatient today.  Possible DC home in a.m., if creatinine improving, follow urine culture.  Time Spent in minutes   35 minutes  Procedures:  Right ureteral stent placement  Consultants:   Urology  Antimicrobials:   Anti-infectives (From admission, onward)   Start     Dose/Rate Route Frequency Ordered Stop   05/16/19 2200  cefTRIAXone (ROCEPHIN) 1 g in sodium chloride 0.9 % 100 mL IVPB     1 g 200 mL/hr over 30 Minutes Intravenous Every 24 hours 05/16/19 1600     05/16/19 1630  ceFAZolin (ANCEF) IVPB 2g/100 mL premix     2 g 200 mL/hr over 30 Minutes Intravenous  Once 05/16/19 1624 05/16/19 1654   05/16/19 1626  ceFAZolin (ANCEF) 2-4 GM/100ML-% IVPB    Note to Pharmacy: Derek [name]   : cabinet override      05/16/19 1626 05/16/19 1644         Medications  Scheduled Meds: . amLODipine  5 mg Oral Daily  . enoxaparin (LOVENOX) injection  40 mg Subcutaneous Q24H  . insulin aspart  0-9 Units Subcutaneous TID WC  . metoprolol tartrate  25 mg Oral BID  . pravastatin  40 mg Oral QHS  . sodium zirconium cyclosilicate  10 g Oral Once  . tamsulosin  0.4 mg Oral QPC supper   Continuous Infusions: . sodium chloride 100 mL/hr at 05/17/19 0353  .  cefTRIAXone (ROCEPHIN)  IV Stopped (05/16/19 2350)   PRN Meds:.hydrALAZINE, HYDROcodone-acetaminophen, morphine injection, ondansetron (ZOFRAN) IV, traMADol      Subjective:   Derek [name] was seen and examined today.  No fevers this morning, BP uncontrolled.  Abdominal pain much improved.  No nausea or vomiting. Patient denies dizziness, chest pain, shortness of breath,  new weakness, numbess, tingling. No acute events overnight.    Objective:   Vitals:   05/17/19 0621 05/17/19 0920 05/17/19 1032 05/17/19 1033  BP: (!) 153/79 (!) 172/77 (!) 150/69 (!) 150/69  Pulse: 62 67  (!) 54  Resp: 18     Temp: 98.1 F (36.7 C)     TempSrc: Oral     SpO2: 97%     Weight:      Height:        Intake/Output Summary (Last 24 hours) at 05/17/2019 1213 Last data filed at 05/17/2019 [id number] Gross per 24 hour  Intake 2400.64 ml  Output 0 ml  Net 2400.64 ml     Wt Readings from Last 3 Encounters:  05/16/19 88 kg  01/29/19 89.8 kg     Exam  General: Alert and oriented x 3, NAD  Eyes:   HEENT:  Atraumatic, normocephalic, normal oropharynx  Cardiovascular: S1 S2 auscultated, no murmurs, RRR  Respiratory: Clear to auscultation bilaterally, no wheezing, rales or rhonchi  Gastrointestinal: Soft, nontender, nondistended, + bowel sounds  Ext: no pedal edema bilaterally  Neuro: No new deficits  Musculoskeletal: No digital cyanosis, clubbing  Skin: No rashes  Psych: Normal affect and demeanor, alert and oriented x3    Data Reviewed:  I have personally reviewed following labs and imaging studies  Micro Results Recent Results (from the past 240 hour(s))  SARS Coronavirus 2 [name] [place] [name] order, Performed in [name] lab) Nasopharyngeal Nasopharyngeal Swab     Status: None   Collection Time: 05/16/19 12:45 PM   Specimen: Nasopharyngeal Swab  Result Value Ref Range Status   SARS Coronavirus 2 NEGATIVE NEGATIVE Final    Comment: (NOTE) If result is NEGATIVE SARS-CoV-2  target nucleic acids are NOT DETECTED. The SARS-CoV-2 RNA is generally detectable in upper and lower  respiratory specimens during the acute phase of infection. The lowest  concentration of SARS-CoV-2 viral copies this assay can detect is 250  copies / mL. A negative result does not preclude SARS-CoV-2 infection  and should not be used as the sole basis for treatment or other  patient management decisions.  A negative result may occur with  improper specimen collection / handling, submission of specimen other  than nasopharyngeal swab, presence of viral mutation(s) within the  areas targeted  by this assay, and inadequate number of viral copies  (<250 copies / mL). A negative result must be combined with clinical  observations, patient history, and epidemiological information. If result is POSITIVE SARS-CoV-2 target nucleic acids are DETECTED. The SARS-CoV-2 RNA is generally detectable in upper and lower  respiratory specimens dur ing the acute phase of infection.  Positive  results are indicative of active infection with SARS-CoV-2.  Clinical  correlation with patient history and other diagnostic information is  necessary to determine patient infection status.  Positive results do  not rule out bacterial infection or co-infection with other viruses. If result is PRESUMPTIVE POSTIVE SARS-CoV-2 nucleic acids MAY BE PRESENT.   A presumptive positive result was obtained on the submitted specimen  and confirmed on repeat testing.  While 2019 novel coronavirus  (SARS-CoV-2) nucleic acids may be present in the submitted sample  additional confirmatory testing may be necessary for epidemiological  and / or clinical management purposes  to differentiate between  SARS-CoV-2 and other Sarbecovirus currently known to infect humans.  If clinically indicated additional testing with an alternate test  methodology [phone]) is advised. The SARS-CoV-2 RNA is generally  detectable in upper and lower  respiratory sp ecimens during the acute  phase of infection. The expected result is Negative. Fact Sheet for Patients:  StrictlyIdeas.no Fact Sheet for Healthcare Providers: BankingDealers.co.za This test is not yet approved or cleared by the [place] FDA and has been authorized for detection and/or diagnosis of SARS-CoV-2 by FDA under an Emergency Use Authorization (EUA).  This EUA will remain in effect (meaning this test can be used) for the duration of the COVID-19 declaration under Section 564(b)(1) of the Act, 21 U.S.C. section 360bbb-3(b)(1), unless the authorization is terminated or revoked sooner. Performed at [name], [place] [address] [place], [place] [address]     Radiology Reports Dg C-arm 1-60 Min-no Report  Result Date: 05/16/2019 Fluoroscopy was utilized by the requesting physician.  No radiographic interpretation.    Lab Data:  CBC: Recent Labs  Lab 05/17/19 0332  WBC 9.2  HGB 11.7*  HCT 36.0*  MCV 99.2  PLT [id number]   Basic Metabolic Panel: Recent Labs  Lab 05/17/19 0332  NA 134*  K 5.3*  CL 100  CO2 23  GLUCOSE 187*  BUN 29*  CREATININE 1.61*  CALCIUM 8.6*   GFR: Estimated Creatinine Clearance: 46.9 mL/min (A) (by C-G formula based on SCr of 1.61 mg/dL (H)). Liver Function Tests: No results for input(s): AST, ALT, ALKPHOS, BILITOT, PROT, ALBUMIN in the last 168 hours. No results for input(s): LIPASE, AMYLASE in the last 168 hours. No results for input(s): AMMONIA in the last 168 hours. Coagulation Profile: No results for input(s): INR, PROTIME in the last 168 hours. Cardiac Enzymes: No results for input(s): CKTOTAL, CKMB, CKMBINDEX, TROPONINI in the last 168 hours. BNP (last 3 results) No results for input(s): PROBNP in the last 8760 hours. HbA1C: Recent Labs    05/16/19 1736  HGBA1C 6.6*   CBG: Recent Labs  Lab 05/16/19 1557 05/16/19 1723 05/16/19 2253  05/17/19 0745 05/17/19 1114  GLUCAP 109* 112* 237* 177* 180*   Lipid Profile: No results for input(s): CHOL, HDL, LDLCALC, TRIG, CHOLHDL, LDLDIRECT in the last 72 hours. Thyroid Function Tests: No results for input(s): TSH, T4TOTAL, FREET4, T3FREE, THYROIDAB in the last 72 hours. Anemia Panel: No results for input(s): VITAMINB12, FOLATE, FERRITIN, TIBC, IRON, RETICCTPCT in the last 72 hours. Urine analysis:    Component Value  Date/Time   COLORURINE YELLOW 05/17/2019 [place] 05/17/2019 1041   LABSPEC 1.014 05/17/2019 1041   PHURINE 6.0 05/17/2019 1041   GLUCOSEU 50 (A) 05/17/2019 1041   HGBUR LARGE (A) 05/17/2019 1041   BILIRUBINUR NEGATIVE 05/17/2019 [place] 05/17/2019 1041   PROTEINUR 30 (A) 05/17/2019 1041   NITRITE NEGATIVE 05/17/2019 1041   LEUKOCYTESUR MODERATE (A) 05/17/2019 1041     Saliou Barnier M.D. [facility] 05/17/2019, 12:13 PM  Pager: [phone] Between 7am to 7pm - call Pager - [phone]  After 7pm go to [email or web address] - password [email or web address]  Call night coverage person covering after 7pm

## 2019-05-17 NOTE — Progress Notes (Signed)
Looks good ? Infected during presentation yesterday Send home when medically OK on broad spectrum antibiotic or c/s specific I will follow- phone given

## 2019-05-18 DIAGNOSIS — N201 Calculus of ureter: Secondary | ICD-10-CM

## 2019-05-18 LAB — URINE CULTURE: Culture: NO GROWTH

## 2019-05-18 LAB — BASIC METABOLIC PANEL
Anion gap: 8 (ref 5–15)
BUN: 32 mg/dL — ABNORMAL HIGH (ref 8–23)
CO2: 24 mmol/L (ref 22–32)
Calcium: 8.6 mg/dL — ABNORMAL LOW (ref 8.9–10.3)
Chloride: 108 mmol/L (ref 98–111)
Creatinine, Ser: 1.3 mg/dL — ABNORMAL HIGH (ref 0.61–1.24)
GFR calc Af Amer: >60 mL/min (ref >60)
GFR calc non Af Amer: 55 mL/min — ABNORMAL LOW (ref >60)
Glucose, Bld: 137 mg/dL — ABNORMAL HIGH (ref 70–99)
Potassium: 4.6 mmol/L (ref 3.5–5.1)
Sodium: 140 mmol/L (ref 135–145)

## 2019-05-18 LAB — GLUCOSE, CAPILLARY
Glucose-Capillary: 114 mg/dL — ABNORMAL HIGH (ref 70–99)
Glucose-Capillary: 91 mg/dL (ref 70–99)

## 2019-05-18 MED ORDER — CEPHALEXIN 500 MG PO CAPS: 500.0000 mg | ORAL_CAPSULE | Freq: Two times a day (BID) | ORAL | 0 refills | Status: AC

## 2019-05-18 MED ORDER — LISINOPRIL 20 MG PO TABS: 20.0000 mg | ORAL_TABLET | Freq: Two times a day (BID) | ORAL | Status: DC

## 2019-05-18 MED ORDER — ONDANSETRON 4 MG PO TBDP: 4.0000 mg | ORAL_TABLET | Freq: Three times a day (TID) | ORAL | 0 refills | Status: DC | PRN

## 2019-05-18 MED ORDER — HYDROCHLOROTHIAZIDE 12.5 MG PO TABS: 12.5000 mg | ORAL_TABLET | Freq: Every day | ORAL | Status: DC

## 2019-05-18 NOTE — Progress Notes (Signed)
Discussed discharged information regarding meds, prescriptions and follow-up with patient. Patient verbalized understanding. IV taken out and belonging returned to patient. No further question ask.

## 2019-05-18 NOTE — Discharge Summary (Signed)
Physician Discharge Summary   Patient ID: [name] MRN: [id number] DOB/AGE: 1946-09-15 73 y.o.  Admit date: 05/16/2019 Discharge date: 05/18/2019  Primary Care Physician:  Allwardt, Alyssa, PA   Recommendations for Outpatient Follow-up:  1. Follow up with PCP in 1-2 weeks  Home Health: None Equipment/Devices:   Discharge Condition: stable  CODE STATUS: FULL  Diet recommendation: Heart healthy diet   Discharge Diagnoses:       Sepsis secondary to right proximal ureteral stone POA Status post right ureteral stent Hyperkalemia . AKI (acute kidney injury) ([place]) . Essential hypertension . Chronic kidney disease, stage III (moderate) ([facility]) Diabetes mellitus type 2, NIDDM, uncontrolled with hyperglycemia  Consults: Oncology, Dr. McDiarmid    Allergies:  No Known Allergies   DISCHARGE MEDICATIONS: Allergies as of 05/18/2019   No Known Allergies     Medication List    TAKE these medications   aspirin EC 81 MG tablet Take 81 mg by mouth daily.   cephALEXin 500 MG capsule Commonly known as: Keflex Take 1 capsule (500 mg total) by mouth 2 (two) times daily for 5 days.   hydrochlorothiazide 12.5 MG tablet Commonly known as: HYDRODIURIL Take 1 tablet (12.5 mg total) by mouth daily. Start taking on: May 19, 2019   lisinopril 20 MG tablet Commonly known as: ZESTRIL Take 1 tablet (20 mg total) by mouth 2 (two) times daily. Start taking on: May 21, 2019 What changed: These instructions start on May 21, 2019. If you are unsure what to do until then, ask your doctor or other care provider.   metFORMIN 500 MG tablet Commonly known as: GLUCOPHAGE Take 1,000 mg by mouth 2 (two) times daily with a meal.   metoprolol tartrate 50 MG tablet Commonly known as: LOPRESSOR Take 25 mg by mouth 2 (two) times daily.   ondansetron 4 MG disintegrating tablet Commonly known as: Zofran ODT Take 1 tablet (4 mg total) by mouth every 8 (eight) hours as needed for  nausea or vomiting.   pravastatin 40 MG tablet Commonly known as: PRAVACHOL Take 40 mg by mouth daily.   tamsulosin 0.4 MG Caps capsule Commonly known as: FLOMAX Take 0.4 mg by mouth.   traMADol 50 MG tablet Commonly known as: ULTRAM Take by mouth 2 (two) times daily.        Brief H and P: For complete details please refer to admission H and P, but in brief *Patient is a 73 year old male with hypertension, hyperlipidemia, diabetes, history of renal stones in the past presented to [facility] ED with right-sided flank pain.  Patient reported no nausea vomiting, dysuria, fever or chills.  At [facility] ED, had a CT abdomen and pelvis which showed 9 mm ureteral stone on the right.  Lab work showed sodium 133, potassium 4.3, creatinine 2.0.  Baseline 0.8.  WBC count 15, hemoglobin 12.7, platelets 258.  UA showed occasional bacteria negative leukocytes.  Patient was transferred to [facility] on ED for further evaluation.   Hospital Course:   Right proximal ureteral stone, sepsis, present on admission -Patient presented to [facility] with fever of 100.1 F in ED, creatinine 2.0, leukocytosis, acute kidney injury, source likely due to infected right proximal ureteral stone.  UA was not very conclusive for UTI. -Urology was consulted, patient was placed on n.p.o. status, IV fluid hydration, pain management and transferred to [facility] for further management. -Patient underwent cystoscopy and right ureteral stent placement, postop day #2 -Patient was placed on IV Rocephin, transition to oral Keflex  for 5 more days to complete full course. -Continue Flomax -Patient will follow-up with urology for further management, stone extraction   Acute kidney injury,, possibly has underlying history of CKD stage III -Unclear if patient has any history of CKD, creatinine 2.0 at the time of presentation to [facility], unknown baseline, last creatinine from 2010 was 0.8 -HCTZ, lisinopril was held -Patient was  placed on IV fluid hydration.  Creatinine improved 1.3 at the time of discharge.  Hyperkalemia -Potassium 5.3, received Lokelma 10 g x 1 -Lisinopril was held, potassium 4.6 at the time of discharge.    Essential hypertension -Continue metoprolol, patient recommended to add his prior antihypertensives gradually starting with HCTZ tomorrow and lisinopril after 2 days.  -Please follow bmet outpatient in 1 week, if patient still has hyperkalemia after restarting lisinopril and HCTZ, ACE inhibitor dose needs to be decreased or discontinued.    Diabetes mellitus, type II ([place]) uncontrolled with hyperglycemia, NIDDM -Metformin was held, patient was placed on sliding scale insulin  A1c 6.6   Day of Discharge S: No complaints, wants to go home, no CVAT, no fevers or chills.  No abdominal pain nausea or vomiting.  BP (!) 144/74 (BP Location: Right Arm)   Pulse (!) 54   Temp 98 F (36.7 C) (Oral)   Resp 16   Ht 5\' 9"  (1.753 m)   Wt 88 kg   SpO2 99%   BMI 28.65 kg/m   Physical Exam: General: Alert and awake oriented x3 not in any acute distress. HEENT: anicteric sclera, pupils reactive to light and accommodation CVS: S1-S2 clear no murmur rubs or gallops Chest: clear to auscultation bilaterally, no wheezing rales or rhonchi Abdomen: soft nontender, nondistended, normal bowel sounds Extremities: no cyanosis, clubbing or edema noted bilaterally Neuro: Cranial nerves II-XII intact, no focal neurological deficits   The results of significant diagnostics from this hospitalization (including imaging, microbiology, ancillary and laboratory) are listed below for reference.      Procedures/Studies:  Dg C-arm 1-60 Min-no Report  Result Date: 05/16/2019 Fluoroscopy was utilized by the requesting physician.  No radiographic interpretation.       LAB RESULTS: Basic Metabolic Panel: Recent Labs  Lab 05/17/19 0332 05/18/19 0319  NA 134* 140  K 5.3* 4.6  CL 100 108  CO2 23 24   GLUCOSE 187* 137*  BUN 29* 32*  CREATININE 1.61* 1.30*  CALCIUM 8.6* 8.6*   Liver Function Tests: No results for input(s): AST, ALT, ALKPHOS, BILITOT, PROT, ALBUMIN in the last 168 hours. No results for input(s): LIPASE, AMYLASE in the last 168 hours. No results for input(s): AMMONIA in the last 168 hours. CBC: Recent Labs  Lab 05/17/19 0332  WBC 9.2  HGB 11.7*  HCT 36.0*  MCV 99.2  PLT 272   Cardiac Enzymes: No results for input(s): CKTOTAL, CKMB, CKMBINDEX, TROPONINI in the last 168 hours. BNP: Invalid input(s): POCBNP CBG: Recent Labs  Lab 05/18/19 0743 05/18/19 1200  GLUCAP 114* 91      Disposition and Follow-up: Discharge Instructions    Diet - low sodium heart healthy   Complete by: As directed    Increase activity slowly   Complete by: As directed        DISPOSITION: Home   DISCHARGE FOLLOW-UP Follow-up Information    MacDiarmid, Nicki Reaper, MD. Schedule an appointment as soon as possible for a visit in 1 week(s).   Specialty: Urology Contact information: [place] [address] [phone]  Time coordinating discharge:  35 minutes  Signed:   Estill Cotta M.D. [facility] 05/18/2019, 1:12 PM

## 2019-05-18 NOTE — Progress Notes (Signed)
Held AM dose of metoprolol due to heart rate in the 50's per Dr Tana Coast.

## 2019-05-24 ENCOUNTER — Other Ambulatory Visit: Payer: Self-pay | Admitting: *Deleted

## 2019-05-24 NOTE — Patient Outreach (Signed)
[facility]) Care Management  05/24/2019  Derek [name] October 08, 1945 [id number]   Red on EMMI Alert Day#4 Date :9/2 Red Alert reason : Read DC instructions :  Answers NO, Follow up scheduled : answer NO, Other questions answer Yes  .   Subjective  Outreach call to patient, person answering phone states that patient is not available. No DPR listed.   Plan Will schedule return call in the next 4 business days Will send unsuccessful outreach letter .     Joylene Draft, RN, [place] Management Coordinator  [phone]- Mobile [phone]- Toll Free Main Office

## 2019-05-29 ENCOUNTER — Other Ambulatory Visit: Payer: Self-pay | Admitting: *Deleted

## 2019-05-29 ENCOUNTER — Other Ambulatory Visit (HOSPITAL_COMMUNITY)
Admission: RE | Admit: 2019-05-29 | Discharge: 2019-05-29 | Disposition: A | Discharge status: Home / Self Care | Payer: Medicare HMO | Source: Ambulatory Visit | Attending: Urology | Admitting: Urology

## 2019-05-29 ENCOUNTER — Ambulatory Visit (INDEPENDENT_AMBULATORY_CARE_PROVIDER_SITE_OTHER): Payer: Medicare HMO | Admitting: Urology

## 2019-05-29 DIAGNOSIS — N200 Calculus of kidney: Secondary | ICD-10-CM | POA: Diagnosis not present

## 2019-05-29 NOTE — Patient Outreach (Signed)
[facility]) Care Management  05/29/2019  Derek [name] 02-02-1946 [id number]   EMMI call attempt #2  Red on EMMI Alert Day#4 Date :9/2 Red Alert reason : Read DC instructions :  Answers NO, Follow up scheduled : answer NO, Other questions answer Yes  .   Subjective  Unsuccessful call attempt to patient, no answer able to leave a HIPAA compliant voicemail message for return call    Plan Will plan return call in the next 4 business days if no return call on today.    Joylene Draft, RN, [place] Management Coordinator  [phone]- Mobile [phone]- Toll Free Main Office

## 2019-05-30 ENCOUNTER — Other Ambulatory Visit: Payer: Self-pay | Admitting: Urology

## 2019-05-30 LAB — URINE CULTURE: Culture: NO GROWTH

## 2019-05-31 ENCOUNTER — Other Ambulatory Visit: Payer: Self-pay

## 2019-05-31 ENCOUNTER — Encounter (HOSPITAL_COMMUNITY): Payer: Self-pay

## 2019-05-31 ENCOUNTER — Other Ambulatory Visit (HOSPITAL_COMMUNITY): Payer: Medicare HMO

## 2019-05-31 ENCOUNTER — Other Ambulatory Visit (HOSPITAL_COMMUNITY)
Admission: RE | Admit: 2019-05-31 | Discharge: 2019-05-31 | Disposition: A | Discharge status: Home / Self Care | Payer: Medicare HMO | Source: Ambulatory Visit | Attending: Urology | Admitting: Urology

## 2019-05-31 DIAGNOSIS — Z01812 Encounter for preprocedural laboratory examination: Secondary | ICD-10-CM | POA: Diagnosis not present

## 2019-05-31 DIAGNOSIS — Z20828 Contact with and (suspected) exposure to other viral communicable diseases: Secondary | ICD-10-CM | POA: Diagnosis not present

## 2019-05-31 LAB — SARS CORONAVIRUS 2 (TAT 6-24 HRS): SARS Coronavirus 2: NEGATIVE

## 2019-05-31 NOTE — Patient Instructions (Addendum)
DUE TO COVID-19 ONLY ONE VISITOR IS ALLOWED TO COME WITH YOU AND STAY IN THE WAITING ROOM ONLY DURING PRE OP AND PROCEDURE. THE ONE VISITOR MAY VISIT WITH YOU IN YOUR PRIVATE ROOM DURING VISITING HOURS ONLY!!   COVID SWAB TESTING COMPLETED ON: Sept. 10 ,2020 (Must self quarantine after testing. Follow instructions on handout.)             Your procedure is scheduled on: Monday, Sept. 14, 2020   Report to [facility] Main  Entrance    Report to admitting at 10:45 AM   Call this number if you have problems the morning of surgery [phone]   Do not eat food or drink liquids :After Midnight.   Brush your teeth the morning of surgery.   Do NOT smoke after Midnight   Take these medicines the morning of surgery with A SIP OF WATER: Metoprolol  DO NOT TAKE ANY DIABETIC MEDICATIONS DAY OF YOUR SURGERY                               You may not have any metal on your body including jewelry, and body piercings             Do not wear lotions, powders, perfumes/cologne, or deodorant                          Men may shave face and neck.   Do not bring valuables to the hospital. [place].   Contacts, dentures or bridgework may not be worn into surgery.    Patients discharged the day of surgery will not be allowed to drive home.   Special Instructions: Bring a copy of your healthcare power of attorney and living will documents         the day of surgery if you haven't scanned them in before.              Please read over the following fact sheets you were given:  [facility] - Preparing for Surgery Before surgery, you can play an important role.  Because skin is not sterile, your skin needs to be as free of germs as possible.  You can reduce the number of germs on your skin by washing with CHG (chlorahexidine gluconate) soap before surgery.  CHG is an antiseptic cleaner which kills germs and bonds with the skin to continue killing  germs even after washing. Please DO NOT use if you have an allergy to CHG or antibacterial soaps.  If your skin becomes reddened/irritated stop using the CHG and inform your nurse when you arrive at Short Stay. Do not shave (including legs and underarms) for at least 48 hours prior to the first CHG shower.  You may shave your face/neck.  Please follow these instructions carefully:  1.  Shower with CHG Soap the night before surgery and the  morning of surgery.  2.  If you choose to wash your hair, wash your hair first as usual with your normal  shampoo.  3.  After you shampoo, rinse your hair and body thoroughly to remove the shampoo.                             4.  Use  CHG as you would any other liquid soap.  You can apply chg directly to the skin and wash.  Gently with a scrungie or clean washcloth.  5.  Apply the CHG Soap to your body ONLY FROM THE NECK DOWN.   Do   not use on face/ open                           Wound or open sores. Avoid contact with eyes, ears mouth and   genitals (private parts).                       Wash face,  Genitals (private parts) with your normal soap.             6.  Wash thoroughly, paying special attention to the area where your    surgery  will be performed.  7.  Thoroughly rinse your body with warm water from the neck down.  8.  DO NOT shower/wash with your normal soap after using and rinsing off the CHG Soap.                9.  Pat yourself dry with a clean towel.            10.  Wear clean pajamas.            11.  Place clean sheets on your bed the night of your first shower and do not  sleep with pets. Day of Surgery : Do not apply any lotions/deodorants the morning of surgery.  Please wear clean clothes to the hospital/surgery center.  FAILURE TO FOLLOW THESE INSTRUCTIONS MAY RESULT IN THE CANCELLATION OF YOUR SURGERY  PATIENT SIGNATURE_________________________________  NURSE  SIGNATURE__________________________________  ________________________________________________________________________

## 2019-06-01 ENCOUNTER — Other Ambulatory Visit: Payer: Self-pay | Admitting: *Deleted

## 2019-06-01 ENCOUNTER — Other Ambulatory Visit: Payer: Self-pay

## 2019-06-01 ENCOUNTER — Encounter (HOSPITAL_COMMUNITY): Payer: Self-pay

## 2019-06-01 ENCOUNTER — Encounter (HOSPITAL_COMMUNITY)
Admission: RE | Admit: 2019-06-01 | Discharge: 2019-06-01 | Disposition: A | Discharge status: Home / Self Care | Payer: Medicare HMO | Source: Ambulatory Visit | Attending: Urology | Admitting: Urology

## 2019-06-01 DIAGNOSIS — Z01812 Encounter for preprocedural laboratory examination: Secondary | ICD-10-CM | POA: Insufficient documentation

## 2019-06-01 HISTORY — DX: Unspecified osteoarthritis, unspecified site: M19.90

## 2019-06-01 HISTORY — DX: Personal history of urinary calculi: Z87.442

## 2019-06-01 HISTORY — DX: Gastro-esophageal reflux disease without esophagitis: K21.9

## 2019-06-01 HISTORY — DX: Personal history of diseases of the blood and blood-forming organs and certain disorders involving the immune mechanism: Z86.2

## 2019-06-01 LAB — BASIC METABOLIC PANEL
Anion gap: 8 (ref 5–15)
BUN: 29 mg/dL — ABNORMAL HIGH (ref 8–23)
CO2: 27 mmol/L (ref 22–32)
Calcium: 9.4 mg/dL (ref 8.9–10.3)
Chloride: 103 mmol/L (ref 98–111)
Creatinine, Ser: 2.15 mg/dL — ABNORMAL HIGH (ref 0.61–1.24)
GFR calc Af Amer: 35 mL/min — ABNORMAL LOW (ref >60)
GFR calc non Af Amer: 30 mL/min — ABNORMAL LOW (ref >60)
Glucose, Bld: 139 mg/dL — ABNORMAL HIGH (ref 70–99)
Potassium: 4.9 mmol/L (ref 3.5–5.1)
Sodium: 138 mmol/L (ref 135–145)

## 2019-06-01 LAB — CBC
HCT: 39.2 % (ref 39.0–52.0)
Hemoglobin: 12.5 g/dL — ABNORMAL LOW (ref 13.0–17.0)
MCH: 31.9 pg (ref 26.0–34.0)
MCHC: 31.9 g/dL (ref 30.0–36.0)
MCV: 100 fL (ref 80.0–100.0)
Platelets: 286 10*3/uL (ref 150–400)
RBC: 3.92 MIL/uL — ABNORMAL LOW (ref 4.22–5.81)
RDW: 12.2 % (ref 11.5–15.5)
WBC: 11.2 10*3/uL — ABNORMAL HIGH (ref 4.0–10.5)
nRBC: 0 % (ref 0.0–0.2)

## 2019-06-01 LAB — GLUCOSE, CAPILLARY: Glucose-Capillary: 131 mg/dL — ABNORMAL HIGH (ref 70–99)

## 2019-06-01 NOTE — Progress Notes (Signed)
PCP - Alyssa Allwardt P.A. at [place] [facility] [place] Cardiologist - Dr, Aundra Dubin after [place] 2010. Is scheduled to see Dr. Harl Bowie 06/16/2019 referred by PCP  Chest x-ray - N/A EKG - 05/16/2019 Stress Test - N/A ECHO -  N/A Cardiac Cath -  N/A  Sleep Study -  N/A CPAP -  N/A  Fasting Blood Sugar - Unknown Checks Blood Sugar __does not check daily___ times a day  Blood Thinner Instructions:  N/A Aspirin Instructions: NO Last Dose: Still currently taking ASA left voicemail to make Derek [name] aware at Dr. Alan Ripper office.  Anesthesia review: History of MI and CABG 2010, HTN, DM no cardiologist at the moment, currently denies chest pains and shortness of breath.  Patient denies shortness of breath, fever, cough and chest pain at PAT appointment   Patient verbalized understanding of instructions that were given to them at the PAT appointment. Patient was also instructed that they will need to review over the PAT instructions again at home before surgery.

## 2019-06-01 NOTE — Patient Outreach (Signed)
[facility]) Care Management  06/01/2019  Derek [name] 1946/05/03 [id number]   EMMI call attempt #3  Red on EMMI Alert Day#4 Date :9/2 Red Alert reason : Read DC instructions : Answers NO, Follow up scheduled : answer NO, Other questions answer Yes .   Subjective Successful outreach call to patient , explained purpose of the call and [facility] care management services. HIPPA information verified x 2 identifiers.  Patient discussed his recent hospital admission related to Kidney Stone and stent placement. He discussed doing well since discharge, urinating without difficulty, no fever or signs of infection. He reports eating and drinking well.  Patient discussed plan to have stent removed in next week as outpatient appointment and plan follow up office visit with Dr. Diona Fanti afterwards .  Social Patient lives at home with spouse , he is independent with care.   Conditions Patient discussed having hypertension , that is controlled he monitors at home and continues to take medications as prescribed after discharge. Discussed having cardiology visit in next month.  Diabetes patient reports being under control with A1c under 7 , he has a meter at home and monitors about once a week. Patient discussed having his flu shot on 9/9 at PCP office.    Medications Patient discussed taking medications as prescribed, denies cost concerns.   Appointment  Patient has attended post discharge follow up with Urology Patient has next scheduled PCP in October, discussed recommendations for follow up after hospital discharge in 1 to 2 weeks patient agreeable to scheduling post discharge visit.   Patient denies any other concerns , or need for [facility]  care management at this time.   Plan Will send patient successful outreach letter.  Will close case no further care management needs identified.    Joylene Draft, RN, [place] Management Coordinator   [phone]- Mobile [phone]- Toll Free Main Office

## 2019-06-01 NOTE — Progress Notes (Signed)
SPOKE W/  Sunil     SCREENING SYMPTOMS OF COVID 19:   COUGH--NO  RUNNY NOSE--- NO  SORE THROAT---NO  NASAL CONGESTION----NO  SNEEZING----NO  SHORTNESS OF BREATH---NO  DIFFICULTY BREATHING---NO  TEMP >100.0 -----NO  UNEXPLAINED BODY ACHES------NO  CHILLS -------- NO  HEADACHES ---------NO  LOSS OF SMELL/ TASTE --------NO    HAVE YOU OR ANY FAMILY MEMBER TRAVELLED PAST 14 DAYS OUT OF THE   COUNTY---Lives in [facility] STATE----NO COUNTRY----NO  HAVE YOU OR ANY FAMILY MEMBER BEEN EXPOSED TO ANYONE WITH COVID 19? NO

## 2019-06-03 NOTE — H&P (Signed)
H&P  Chief Complaint: Kidney stone  History of Present Illness: 73 yo male presents for URS and HLL of a rt upper ureteral stone that was stented urgently on 8.26.2020 for obstruction and possible UTI. His UTI has been treated appropriately.mI saw him last week for preop evaluation/urinalysis. URS and stone mgmt was discussed. He understands the procedure a well as risks/complications and desires to proceed.  Past Medical History:  Diagnosis Date  . Arthritis    fingers  . Diabetes mellitus without complication ([place])   . GERD (gastroesophageal reflux disease)    rare take over the counter if needed  . History of iron deficiency anemia   . History of kidney stones   . Hyperlipidemia   . Hypertension   . Myocardial infarction ([place]) 2010  . Pneumonia    remote history of    Past Surgical History:  Procedure Laterality Date  . CORONARY ARTERY BYPASS GRAFT  2010   QUADRUPLE BYPASS AFTER HEART ATTACK   . CYSTOSCOPY W/ URETERAL STENT PLACEMENT Right 05/16/2019   Procedure: CYSTOSCOPY WITH RETROGRADE PYELOGRAM/URETERAL STENT PLACEMENT;  Surgeon: Bjorn Loser, MD;  Location: [facility];  Service: Urology;  Laterality: Right;  . PILONIDAL CYST EXCISION  1960    Home Medications:  Allergies as of 06/03/2019   No Known Allergies     Medication List    Notice   Cannot display discharge medications because the patient has not yet been admitted.     Allergies: No Known Allergies  Family History  Problem Relation Age of Onset  . Thyroid disease Mother   . Heart attack Father     Social History:  reports that he quit smoking about 10 years ago. His smoking use included cigarettes. He has a 36.00 pack-year smoking history. He has never used smokeless tobacco. He reports current alcohol use. He reports that he does not use drugs.  ROS: A complete review of systems was performed.  All systems are negative except for pertinent findings as noted.  Physical Exam:  Vital signs in last  24 hours:   Constitutional:  Alert and oriented, No acute distress Cardiovascular: Regular rate  Respiratory: Normal respiratory effort GI: Abdomen is soft, nontender, nondistended, no abdominal masses. No CVAT.  Genitourinary: Normal male phallus, testes are descended bilaterally and non-tender and without masses, scrotum is normal in appearance without lesions or masses, perineum is normal on inspection. Lymphatic: No lymphadenopathy Neurologic: Grossly intact, no focal deficits Psychiatric: Normal mood and affect  Laboratory Data:  Recent Labs    06/01/19 1016  WBC 11.2*  HGB 12.5*  HCT 39.2  PLT 286    Recent Labs    06/01/19 1016  NA 138  K 4.9  CL 103  GLUCOSE 139*  BUN 29*  CALCIUM 9.4  CREATININE 2.15*     No results found for this or any previous visit (from the past 24 hour(s)). Recent Results (from the past 240 hour(s))  Culture, Urine     Status: None   Collection Time: 05/29/19  5:33 PM   Specimen: Urine, Clean Catch  Result Value Ref Range Status   Specimen Description   Final    URINE, CLEAN CATCH Performed at [facility], [address], [place], [place] [address]    Special Requests   Final    NONE Performed at [facility], [address] [place], [place] [address]    Culture   Final    NO GROWTH Performed at [facility], [place]  [address], [place], [place] [address]    Report Status 05/30/2019 FINAL  Final  SARS CORONAVIRUS 2 (TAT 6-24 HRS) Nasopharyngeal Nasopharyngeal Swab     Status: None   Collection Time: 05/31/19 10:13 AM   Specimen: Nasopharyngeal Swab  Result Value Ref Range Status   SARS Coronavirus 2 NEGATIVE NEGATIVE Final    Comment: (NOTE) SARS-CoV-2 target nucleic acids are NOT DETECTED. The SARS-CoV-2 RNA is generally detectable in upper and lower respiratory specimens during the acute phase of infection. Negative results do not preclude SARS-CoV-2 infection, do not rule out co-infections with other pathogens, and  should not be used as the sole basis for treatment or other patient management decisions. Negative results must be combined with clinical observations, patient history, and epidemiological information. The expected result is Negative. Fact Sheet for Patients: SugarRoll.be Fact Sheet for Healthcare Providers: https://www.woods-mathews.com/ This test is not yet approved or cleared by the [place] FDA and  has been authorized for detection and/or diagnosis of SARS-CoV-2 by FDA under an Emergency Use Authorization (EUA). This EUA will remain  in effect (meaning this test can be used) for the duration of the COVID-19 declaration under Section 56 4(b)(1) of the Act, 21 U.S.C. section 360bbb-3(b)(1), unless the authorization is terminated or revoked sooner. Performed at [facility], [place] [address], [place], [place] [address]     Renal Function: Recent Labs    06/01/19 1016  CREATININE 2.15*   Estimated Creatinine Clearance: 35.1 mL/min (A) (by C-G formula based on SCr of 2.15 mg/dL (H)).  Radiologic Imaging: No results found.  Impression/Assessment:  Rt upper ureteral stone  Plan: Cysto, right URS with HLL/extraction of ureteral and renal calculi, possible stent replacement

## 2019-06-04 ENCOUNTER — Ambulatory Visit (HOSPITAL_COMMUNITY): Payer: Medicare HMO | Admitting: Certified Registered"

## 2019-06-04 ENCOUNTER — Ambulatory Visit (HOSPITAL_COMMUNITY)
Admission: RE | Admit: 2019-06-04 | Discharge: 2019-06-04 | Disposition: A | Discharge status: Home / Self Care | Payer: Medicare HMO | Attending: Urology | Admitting: Urology

## 2019-06-04 ENCOUNTER — Ambulatory Visit (HOSPITAL_COMMUNITY): Payer: Medicare HMO | Admitting: Physician Assistant

## 2019-06-04 ENCOUNTER — Ambulatory Visit (HOSPITAL_COMMUNITY): Payer: Medicare HMO

## 2019-06-04 ENCOUNTER — Encounter (HOSPITAL_COMMUNITY): Payer: Self-pay | Admitting: *Deleted

## 2019-06-04 ENCOUNTER — Other Ambulatory Visit: Payer: Self-pay

## 2019-06-04 ENCOUNTER — Encounter (HOSPITAL_COMMUNITY)
Admission: RE | Disposition: A | Discharge status: Home / Self Care | Payer: Self-pay | Source: Home / Self Care | Attending: Urology

## 2019-06-04 DIAGNOSIS — K219 Gastro-esophageal reflux disease without esophagitis: Secondary | ICD-10-CM | POA: Insufficient documentation

## 2019-06-04 DIAGNOSIS — N183 Chronic kidney disease, stage 3 (moderate): Secondary | ICD-10-CM | POA: Diagnosis not present

## 2019-06-04 DIAGNOSIS — I2510 Atherosclerotic heart disease of native coronary artery without angina pectoris: Secondary | ICD-10-CM | POA: Diagnosis not present

## 2019-06-04 DIAGNOSIS — I129 Hypertensive chronic kidney disease with stage 1 through stage 4 chronic kidney disease, or unspecified chronic kidney disease: Secondary | ICD-10-CM | POA: Diagnosis not present

## 2019-06-04 DIAGNOSIS — Z951 Presence of aortocoronary bypass graft: Secondary | ICD-10-CM | POA: Insufficient documentation

## 2019-06-04 DIAGNOSIS — E1122 Type 2 diabetes mellitus with diabetic chronic kidney disease: Secondary | ICD-10-CM | POA: Diagnosis not present

## 2019-06-04 DIAGNOSIS — E785 Hyperlipidemia, unspecified: Secondary | ICD-10-CM | POA: Diagnosis not present

## 2019-06-04 DIAGNOSIS — Z7982 Long term (current) use of aspirin: Secondary | ICD-10-CM | POA: Insufficient documentation

## 2019-06-04 DIAGNOSIS — I252 Old myocardial infarction: Secondary | ICD-10-CM | POA: Insufficient documentation

## 2019-06-04 DIAGNOSIS — N201 Calculus of ureter: Secondary | ICD-10-CM | POA: Diagnosis not present

## 2019-06-04 DIAGNOSIS — Z87891 Personal history of nicotine dependence: Secondary | ICD-10-CM | POA: Diagnosis not present

## 2019-06-04 DIAGNOSIS — E119 Type 2 diabetes mellitus without complications: Secondary | ICD-10-CM | POA: Diagnosis not present

## 2019-06-04 DIAGNOSIS — I10 Essential (primary) hypertension: Secondary | ICD-10-CM | POA: Diagnosis not present

## 2019-06-04 DIAGNOSIS — Z7984 Long term (current) use of oral hypoglycemic drugs: Secondary | ICD-10-CM | POA: Diagnosis not present

## 2019-06-04 DIAGNOSIS — N202 Calculus of kidney with calculus of ureter: Secondary | ICD-10-CM | POA: Insufficient documentation

## 2019-06-04 DIAGNOSIS — Z79899 Other long term (current) drug therapy: Secondary | ICD-10-CM | POA: Diagnosis not present

## 2019-06-04 HISTORY — PX: CYSTOSCOPY/URETEROSCOPY/HOLMIUM LASER/STENT PLACEMENT: SHX6546

## 2019-06-04 HISTORY — PX: CYSTOSCOPY W/ URETERAL STENT REMOVAL: SHX1430

## 2019-06-04 HISTORY — PX: CYSTOSCOPY WITH HOLMIUM LASER LITHOTRIPSY: SHX6639

## 2019-06-04 LAB — GLUCOSE, CAPILLARY
Glucose-Capillary: 112 mg/dL — ABNORMAL HIGH (ref 70–99)
Glucose-Capillary: 135 mg/dL — ABNORMAL HIGH (ref 70–99)

## 2019-06-04 SURGERY — CYSTOSCOPY/URETEROSCOPY/HOLMIUM LASER/STENT PLACEMENT
Anesthesia: General | Site: Bladder | Laterality: Right

## 2019-06-04 MED ORDER — DEXAMETHASONE SODIUM PHOSPHATE 10 MG/ML IJ SOLN
INTRAMUSCULAR | Status: DC | PRN
  Administered 2019-06-04: 4 mg via INTRAVENOUS

## 2019-06-04 MED ORDER — ACETAMINOPHEN 325 MG PO TABS: 325.0000 mg | ORAL_TABLET | ORAL | Status: DC | PRN

## 2019-06-04 MED ORDER — PROPOFOL 10 MG/ML IV BOLUS
INTRAVENOUS | Status: DC | PRN
  Administered 2019-06-04: 140 mg via INTRAVENOUS

## 2019-06-04 MED ORDER — CEFAZOLIN SODIUM-DEXTROSE 2-4 GM/100ML-% IV SOLN
2.0000 g | INTRAVENOUS | Status: AC
  Administered 2019-06-04: 2 g via INTRAVENOUS
  Filled 2019-06-04: qty 100

## 2019-06-04 MED ORDER — PROPOFOL 10 MG/ML IV BOLUS
INTRAVENOUS | Status: AC
  Filled 2019-06-04: qty 20

## 2019-06-04 MED ORDER — MEPERIDINE HCL 50 MG/ML IJ SOLN: 6.2500 mg | INTRAMUSCULAR | Status: DC | PRN

## 2019-06-04 MED ORDER — ACETAMINOPHEN 160 MG/5ML PO SOLN: 325.0000 mg | ORAL | Status: DC | PRN

## 2019-06-04 MED ORDER — SODIUM CHLORIDE 0.9 % IR SOLN
Status: DC | PRN
  Administered 2019-06-04 (×2): 3000 mL

## 2019-06-04 MED ORDER — DEXAMETHASONE SODIUM PHOSPHATE 10 MG/ML IJ SOLN
INTRAMUSCULAR | Status: AC
  Filled 2019-06-04: qty 1

## 2019-06-04 MED ORDER — FENTANYL CITRATE (PF) 100 MCG/2ML IJ SOLN
25.0000 ug | INTRAMUSCULAR | Status: DC | PRN
  Administered 2019-06-04 (×2): 50 ug via INTRAVENOUS

## 2019-06-04 MED ORDER — LACTATED RINGERS IV SOLN
INTRAVENOUS | Status: DC
  Administered 2019-06-04: 11:00:00 via INTRAVENOUS

## 2019-06-04 MED ORDER — FENTANYL CITRATE (PF) 100 MCG/2ML IJ SOLN
INTRAMUSCULAR | Status: AC
  Filled 2019-06-04: qty 2

## 2019-06-04 MED ORDER — ONDANSETRON HCL 4 MG/2ML IJ SOLN
INTRAMUSCULAR | Status: AC
  Filled 2019-06-04: qty 2

## 2019-06-04 MED ORDER — DIATRIZOATE MEGLUMINE 30 % UR SOLN
URETHRAL | Status: AC
  Filled 2019-06-04: qty 100

## 2019-06-04 MED ORDER — STERILE WATER FOR IRRIGATION IR SOLN
Status: DC | PRN
  Administered 2019-06-04: 500 mL

## 2019-06-04 MED ORDER — LIDOCAINE 2% (20 MG/ML) 5 ML SYRINGE
INTRAMUSCULAR | Status: DC | PRN
  Administered 2019-06-04: 60 mg via INTRAVENOUS

## 2019-06-04 MED ORDER — ONDANSETRON HCL 4 MG/2ML IJ SOLN: 4.0000 mg | Freq: Once | INTRAMUSCULAR | Status: DC | PRN

## 2019-06-04 MED ORDER — OXYCODONE HCL 5 MG/5ML PO SOLN: 5.0000 mg | Freq: Once | ORAL | Status: DC | PRN

## 2019-06-04 MED ORDER — IOHEXOL 300 MG/ML  SOLN
INTRAMUSCULAR | Status: DC | PRN
  Administered 2019-06-04: 10 mL

## 2019-06-04 MED ORDER — LIDOCAINE 2% (20 MG/ML) 5 ML SYRINGE
INTRAMUSCULAR | Status: AC
  Filled 2019-06-04: qty 5

## 2019-06-04 MED ORDER — FENTANYL CITRATE (PF) 100 MCG/2ML IJ SOLN
INTRAMUSCULAR | Status: DC | PRN
  Administered 2019-06-04 (×7): 25 ug via INTRAVENOUS
  Administered 2019-06-04: 50 ug via INTRAVENOUS

## 2019-06-04 MED ORDER — ONDANSETRON HCL 4 MG/2ML IJ SOLN
INTRAMUSCULAR | Status: DC | PRN
  Administered 2019-06-04: 4 mg via INTRAVENOUS

## 2019-06-04 MED ORDER — OXYBUTYNIN CHLORIDE 5 MG PO TABS: 5.0000 mg | ORAL_TABLET | Freq: Three times a day (TID) | ORAL | 1 refills | Status: DC | PRN

## 2019-06-04 MED ORDER — EPHEDRINE SULFATE-NACL 50-0.9 MG/10ML-% IV SOSY
PREFILLED_SYRINGE | INTRAVENOUS | Status: DC | PRN
  Administered 2019-06-04 (×4): 5 mg via INTRAVENOUS
  Administered 2019-06-04 (×2): 10 mg via INTRAVENOUS
  Administered 2019-06-04: 5 mg via INTRAVENOUS

## 2019-06-04 MED ORDER — CEPHALEXIN 250 MG PO CAPS: 250.0000 mg | ORAL_CAPSULE | Freq: Two times a day (BID) | ORAL | 0 refills | Status: DC

## 2019-06-04 MED ORDER — OXYCODONE HCL 5 MG PO TABS: 5.0000 mg | ORAL_TABLET | Freq: Once | ORAL | Status: DC | PRN

## 2019-06-04 SURGICAL SUPPLY — 26 items
BAG URO CATCHER STRL LF (MISCELLANEOUS) ×3 IMPLANT
BASKET LASER NITINOL 1.9FR (BASKET) ×3 IMPLANT
BASKET ZERO TIP NITINOL 2.4FR (BASKET) IMPLANT
CATH INTERMIT  6FR 70CM (CATHETERS) ×3 IMPLANT
CLOTH BEACON ORANGE TIMEOUT ST (SAFETY) ×3 IMPLANT
COVER WAND RF STERILE (DRAPES) IMPLANT
EXTRACTOR STONE 1.7FRX115CM (UROLOGICAL SUPPLIES) ×3 IMPLANT
EXTRACTOR STONE NITINOL NGAGE (UROLOGICAL SUPPLIES) ×3 IMPLANT
FIBER LASER FLEXIVA 365 (UROLOGICAL SUPPLIES) IMPLANT
FIBER LASER TRAC TIP (UROLOGICAL SUPPLIES) ×3 IMPLANT
GLOVE BIOGEL M 8.0 STRL (GLOVE) ×3 IMPLANT
GOWN STRL REUS W/ TWL XL LVL3 (GOWN DISPOSABLE) ×2 IMPLANT
GOWN STRL REUS W/TWL LRG LVL3 (GOWN DISPOSABLE) ×6 IMPLANT
GOWN STRL REUS W/TWL XL LVL3 (GOWN DISPOSABLE) ×1
GUIDEWIRE ANG ZIPWIRE 038X150 (WIRE) ×3 IMPLANT
GUIDEWIRE STR DUAL SENSOR (WIRE) ×3 IMPLANT
IV NS 1000ML (IV SOLUTION) ×1
IV NS 1000ML BAXH (IV SOLUTION) ×2 IMPLANT
KIT TURNOVER KIT A (KITS) IMPLANT
MANIFOLD NEPTUNE II (INSTRUMENTS) ×3 IMPLANT
PACK CYSTO (CUSTOM PROCEDURE TRAY) ×3 IMPLANT
SHEATH URETERAL 12FRX35CM (MISCELLANEOUS) ×3 IMPLANT
STENT URET 6FRX24 CONTOUR (STENTS) ×3 IMPLANT
SYR 10ML LL (SYRINGE) ×3 IMPLANT
TUBING CONNECTING 10 (TUBING) ×3 IMPLANT
TUBING UROLOGY SET (TUBING) ×3 IMPLANT

## 2019-06-04 NOTE — Interval H&P Note (Signed)
History and Physical Interval Note:  06/04/2019 12:20 PM  [name]  has presented today for surgery, with the diagnosis of RIGHT URETERAL STONE.  The various methods of treatment have been discussed with the patient and family. After consideration of risks, benefits and other options for treatment, the patient has consented to  Procedure(s): CYSTOSCOPY, URETEROSCOPY, HOLMIUM LASER, STENT PLACEMENT (Right) CYSTOSCOPY WITH STENT REMOVAL (Right) as a surgical intervention.  The patient's history has been reviewed, patient examined, no change in status, stable for surgery.  I have reviewed the patient's chart and labs.  Questions were answered to the patient's satisfaction.     Derek [name]

## 2019-06-04 NOTE — Anesthesia Procedure Notes (Signed)
Procedure Name: LMA Insertion Date/Time: 06/04/2019 12:32 PM Performed by: Eben Burow, CRNA Pre-anesthesia Checklist: Patient identified, Emergency Drugs available, Suction available, Patient being monitored and Timeout performed Patient Re-evaluated:Patient Re-evaluated prior to induction Oxygen Delivery Method: Circle system utilized Preoxygenation: Pre-oxygenation with 100% oxygen Induction Type: IV induction Ventilation: Mask ventilation without difficulty LMA: LMA inserted LMA Size: 4.0 Tube secured with: Tape Dental Injury: Teeth and Oropharynx as per pre-operative assessment

## 2019-06-04 NOTE — OR Nursing (Signed)
Bilateral ureteral stents placed, string cut off on both.

## 2019-06-04 NOTE — Transfer of Care (Signed)
Immediate Anesthesia Transfer of Care Note  Patient: Derek [name]  Procedure(s) Performed: CYSTOSCOPY, URETEROSCOPY, RETROGRADE PYELOGRAM, STENT PLACEMENT (Left Bladder) CYSTOSCOPY WITH STENT REMOVAL (Right Bladder) CYSTOSCOPY, URETEROSCOPY, WITH HOLMIUM LASER, STENT PLACEMENT (Right Bladder)  Patient Location: PACU  Anesthesia Type:General  Level of Consciousness: awake, alert  and oriented  Airway & Oxygen Therapy: Patient Spontanous Breathing and Patient connected to face mask oxygen  Post-op Assessment: Report given to RN  Post vital signs: Reviewed and stable  Last Vitals:  Vitals Value Taken Time  BP 144/66 06/04/19 1415  Temp    Pulse 88 06/04/19 1415  Resp 14 06/04/19 1415  SpO2 100 % 06/04/19 1415  Vitals shown include unvalidated device data.  Last Pain:  Vitals:   06/04/19 1048  TempSrc:   PainSc: 0-No pain         Complications: No apparent anesthesia complications

## 2019-06-04 NOTE — Anesthesia Preprocedure Evaluation (Addendum)
Anesthesia Evaluation  Patient identified by MRN, date of birth, ID band Patient awake    Reviewed: Allergy & Precautions, NPO status , Patient's Chart, lab work & pertinent test results  History of Anesthesia Complications Negative for: history of anesthetic complications  Airway Mallampati: II  TM Distance: >3 FB Neck ROM: Full    Dental  (+) Chipped, Poor Dentition, Missing, Dental Advisory Given, Edentulous Upper,    Pulmonary former smoker,    Pulmonary exam normal        Cardiovascular hypertension, Pt. on medications and Pt. on home beta blockers + CAD and + CABG  Normal cardiovascular exam     Neuro/Psych negative neurological ROS  negative psych ROS   GI/Hepatic Neg liver ROS, GERD  Controlled,  Endo/Other  diabetes, Type 2, Oral Hypoglycemic Agents  Renal/GU CRFRenal disease     Musculoskeletal  (+) Arthritis ,   Abdominal   Peds  Hematology negative hematology ROS (+)   Anesthesia Other Findings   Reproductive/Obstetrics                            Anesthesia Physical  Anesthesia Plan  ASA: III  Anesthesia Plan: General   Post-op Pain Management:    Induction: Intravenous  PONV Risk Score and Plan: 2 and Treatment may vary due to age or medical condition, Ondansetron and Dexamethasone  Airway Management Planned: LMA and Oral ETT  Additional Equipment: None  Intra-op Plan:   Post-operative Plan: Extubation in OR  Informed Consent: I have reviewed the patients History and Physical, chart, labs and discussed the procedure including the risks, benefits and alternatives for the proposed anesthesia with the patient or authorized representative who has indicated his/her understanding and acceptance.     Dental advisory given  Plan Discussed with: CRNA, Anesthesiologist and Surgeon  Anesthesia Plan Comments:         Anesthesia Quick Evaluation

## 2019-06-04 NOTE — Anesthesia Postprocedure Evaluation (Signed)
Anesthesia Post Note  Patient: [name]  Procedure(s) Performed: CYSTOSCOPY, URETEROSCOPY, RETROGRADE PYELOGRAM, STENT PLACEMENT (Left Bladder) CYSTOSCOPY WITH STENT REMOVAL (Right Bladder) CYSTOSCOPY, URETEROSCOPY, WITH HOLMIUM LASER, STENT PLACEMENT (Right Bladder)     Patient location during evaluation: PACU Anesthesia Type: General Level of consciousness: sedated Pain management: pain level controlled Vital Signs Assessment: post-procedure vital signs reviewed and stable Respiratory status: spontaneous breathing and respiratory function stable Cardiovascular status: stable Postop Assessment: no apparent nausea or vomiting Anesthetic complications: no    Last Vitals:  Vitals:   06/04/19 1445 06/04/19 1500  BP: (!) 163/81 (!) 159/84  Pulse: 91 89  Resp: 11 12  Temp: 37.1 C 37.1 C  SpO2: 97% 97%    Last Pain:  Vitals:   06/04/19 1500  TempSrc:   PainSc: 3                  Jewell Haught DANIEL

## 2019-06-04 NOTE — Discharge Instructions (Signed)

## 2019-06-05 ENCOUNTER — Encounter (HOSPITAL_COMMUNITY): Payer: Self-pay | Admitting: Urology

## 2019-06-14 NOTE — Op Note (Signed)
Preoperative diagnosis: Right upper ureteral stone, right renal calculi, status post recent urgent stent placement for infected/obstructing stones  Postoperative diagnosis: Same, with new diagnosis of distal left ureteral calculi  Principal procedure: Cystoscopy, right double-J stent extraction, right retrograde ureteropyelogram, right ureteroscopy, holmium laser and extraction of right upper ureteral stone, right renal stones, left retrograde ureteropyelogram, intraoperative fluoroscopy, left ureteroscopy with holmium laser and extraction of left ureteral stones, bilateral double-J stent placement  Surgeon: Nevae Pinnix  Anesthesia: General  Complications: None  Specimens: Stone fragments  Drains: Bilateral double-J stents, 24 cm x 6 [place]  Indications: 73 year old male status post recent urgent stent placement for infected, obstructed right upper ureteral stone.  He is status post hospitalization for antibiotic management.  He presents at this time, following recent negative urine culture, for management of the stone as well as other ureteral calculi.  I discussed the procedure with the patient as well as risks and complications involved.  These include but are not limited to repeat infection, ureteral injury, need for repeated stent placement with symptoms thereof, anesthetic complications among others.  He understands and desires to proceed.  Findings: Mild obstruction of prostatic urethra on cystoscopy.  Bladder was normal except for the presence of a right ureteral stent.  There was bulging of the left ureteral orifice with an obvious stone.  Retrograde on the left revealed 2 filling defects in the left distal ureter as well as proximal hydroureteronephrosis.  Upon ureteroscopy the previous right ureteral stone was now present in the renal pelvis.  There were multiple right renal calculi in various calyces.  Description of procedure: Following identification and marking of the patient in the  holding area, he was taken to the operating room where general anesthetic was administered.  He was placed in the dorsal lithotomy position.  Genitalia and perineum were prepped and draped.  Antibiotics were properly administered.  68 French panendoscope was advanced into the bladder.  The right ureteral stent was removed.  Retrograde ureteropyelogram was performed revealing a normal-appearing right ureter.  Left retrograde ureteropyelogram revealed obvious filling defects from the stones in the distal ureter.  The left ureteral orifice was dilated with first the obturator then the entire 12/14 ureteral access catheter.  Following this, ureteroscope was advanced into the left distal ureter where the stones were encountered and fragmented with the holmium laser.  Fragments were extracted with the engage basket.  Following removal of all stone material from the left ureter, a 26 cm x 6 French contour double-J stent was placed with adequate positioning using fluoroscopic and cystoscopic guidance following wire removal.  Following right retrograde ureteropyelogram the medium length ureteral access catheter previously used was placed in the right ureter, first with dilation with the obturator then the entire access catheter.  The obturator was removed and the flexible ureteroscope was then passed.  Multiple stones were seen in the right kidney, both in the renal pelvis, the interpolar and lower pole calyces.  Smaller stones were easily extracted through the access catheter.  Larger stones were fragmented with holmium laser using the 200 m fiber.  These were then extracted through the access catheter.  Following removal of all significant stone matter from the pyelo-calyceal system on the right, the ureteroscope was removed.  Guidewire was replaced through the access catheter, and using a cystoscope, 26 cm x 6 French contour double-J stent was again placed.  This was adequately positioned.  The bladder was then  drained.  The ureteroscope was then removed.  The patient  was then awakened and taken to the PACU in stable condition.

## 2019-06-19 ENCOUNTER — Ambulatory Visit: Payer: Medicare HMO | Admitting: Urology

## 2019-06-26 ENCOUNTER — Ambulatory Visit (INDEPENDENT_AMBULATORY_CARE_PROVIDER_SITE_OTHER): Payer: Medicare HMO | Admitting: Urology

## 2019-06-26 ENCOUNTER — Ambulatory Visit: Payer: Medicare HMO | Admitting: Cardiology

## 2019-06-26 DIAGNOSIS — N200 Calculus of kidney: Secondary | ICD-10-CM | POA: Diagnosis not present

## 2019-06-28 ENCOUNTER — Other Ambulatory Visit: Payer: Self-pay | Admitting: Urology

## 2019-06-28 ENCOUNTER — Other Ambulatory Visit (HOSPITAL_COMMUNITY): Payer: Self-pay | Admitting: Urology

## 2019-06-28 DIAGNOSIS — N200 Calculus of kidney: Secondary | ICD-10-CM

## 2019-07-13 ENCOUNTER — Telehealth: Payer: Self-pay | Admitting: Cardiology

## 2019-07-13 NOTE — Telephone Encounter (Signed)

## 2019-07-16 ENCOUNTER — Encounter: Payer: Self-pay | Admitting: Cardiology

## 2019-07-16 ENCOUNTER — Other Ambulatory Visit: Payer: Self-pay

## 2019-07-16 ENCOUNTER — Ambulatory Visit (INDEPENDENT_AMBULATORY_CARE_PROVIDER_SITE_OTHER): Payer: Medicare HMO | Admitting: Cardiology

## 2019-07-16 VITALS — BP 160/72 | HR 46 | Ht 70.0 in | Wt 196.0 lb

## 2019-07-16 DIAGNOSIS — I10 Essential (primary) hypertension: Secondary | ICD-10-CM

## 2019-07-16 DIAGNOSIS — E782 Mixed hyperlipidemia: Secondary | ICD-10-CM | POA: Diagnosis not present

## 2019-07-16 DIAGNOSIS — I25119 Atherosclerotic heart disease of native coronary artery with unspecified angina pectoris: Secondary | ICD-10-CM

## 2019-07-16 MED ORDER — NITROGLYCERIN 0.4 MG SL SUBL: 0.4000 mg | SUBLINGUAL_TABLET | SUBLINGUAL | 3 refills | Status: AC | PRN

## 2019-07-16 MED ORDER — METOPROLOL SUCCINATE ER 25 MG PO TB24: 12.5000 mg | ORAL_TABLET | Freq: Every day | ORAL | 6 refills | Status: DC

## 2019-07-16 NOTE — Patient Instructions (Signed)
Medication Instructions:   Stop Lopressor (Metoprolol Tart).  Begin Toprol XL 12.5mg  daily.   Begin Nitroglycerin as needed chest pain.   Continue all other medications.    Labwork: none  Testing/Procedures: none  Follow-Up: Your physician wants you to follow up in:  1 year.  You will receive a reminder letter in the mail one-two months in advance.  If you don't receive a letter, please call our office to schedule the follow up appointment   Any Other Special Instructions Will Be Listed Below (If Applicable).  If you need a refill on your cardiac medications before your next appointment, please call your pharmacy.

## 2019-07-16 NOTE — Progress Notes (Signed)
Cardiology Office Note  Date: 07/16/2019   ID: Derek [name], DOB 1946/01/19, MRN [id number]  PCP:  Theresa Duty, PA  Cardiologist:  Rozann Lesches, MD Electrophysiologist:  None   Chief Complaint  Patient presents with   Cardiac follow-up    History of Present Illness: Derek [name] is a 73 y.o. male referred for cardiology consultation by Ms. Allwardt, PA-C for cardiac follow-up.  He had mentioned during routine visit with PCP a vague feeling of light vibration on the left side of his chest, almost as if a cell phone was buzzing in his pocket.  This symptom has subsequently resolved and he has had no exertional chest pain, no palpitations, NYHA class II dyspnea, and no syncope.  Records indicate previous follow-up with Dr. Aundra Dubin as of 2011 with a history of CAD status post NSTEMI and CABG in 2010.  I personally reviewed his tracing from May of this year per PCP which showed sinus bradycardia with borderline prolonged PR interval.  I reviewed his current medications.  He is bradycardic at baseline, we discussed reducing beta-blocker and changing to Toprol-XL.  He has had good lipid control overall with LDL 66 in May on Pravachol.  He does not have nitroglycerin available.  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  Past Medical History:  Diagnosis Date   Arthritis    fingers   CAD (coronary artery disease)    CABG 2010 - LIMA to LAD, SVG to diagonal, SVG to PDA and PLV   Essential hypertension    GERD (gastroesophageal reflux disease)    History of iron deficiency anemia    History of kidney stones    History of pneumonia    Hyperlipidemia    NSTEMI (non-ST elevated myocardial infarction) ([place]) 2010   Type 2 diabetes mellitus ([place])     Past Surgical History:  Procedure Laterality Date   CORONARY ARTERY BYPASS GRAFT  2010   QUADRUPLE BYPASS AFTER HEART ATTACK    CYSTOSCOPY W/ URETERAL STENT PLACEMENT Right 05/16/2019   Procedure: CYSTOSCOPY WITH RETROGRADE PYELOGRAM/URETERAL STENT PLACEMENT;  Surgeon: Bjorn Loser, MD;  Location: [facility];  Service: Urology;  Laterality: Right;   CYSTOSCOPY W/ URETERAL STENT REMOVAL Right 06/04/2019   Procedure: CYSTOSCOPY WITH STENT REMOVAL;  Surgeon: Franchot Gallo, MD;  Location: [facility];  Service: Urology;  Laterality: Right;   CYSTOSCOPY WITH HOLMIUM LASER LITHOTRIPSY Right 06/04/2019   Procedure: CYSTOSCOPY, URETEROSCOPY, WITH HOLMIUM LASER, STENT PLACEMENT;  Surgeon: Franchot Gallo, MD;  Location: [facility];  Service: Urology;  Laterality: Right;   CYSTOSCOPY/URETEROSCOPY/HOLMIUM LASER/STENT PLACEMENT Left 06/04/2019   Procedure: CYSTOSCOPY, URETEROSCOPY, RETROGRADE PYELOGRAM, STENT PLACEMENT;  Surgeon: Franchot Gallo, MD;  Location: [facility];  Service: Urology;  Laterality: Left;   PILONIDAL CYST EXCISION  1960    Current Outpatient Medications  Medication Sig Dispense Refill   aspirin EC 81 MG tablet Take 81 mg by mouth daily.     Histamine Dihydrochloride (AUSTRALIAN DREAM ARTHRITIS) 0.025 % CREA Apply 1 application topically daily as needed (pain).     hydrochlorothiazide (HYDRODIURIL) 12.5 MG tablet Take 1 tablet (12.5 mg total) by mouth daily.     Lidocaine HCl (ASPERCREME W/LIDOCAINE) 4 % CREA Apply 1 application topically daily as needed (pain).     lisinopril (ZESTRIL) 20 MG tablet Take 1 tablet (20 mg total) by mouth 2 (two) times daily.     metFORMIN (GLUCOPHAGE-XR) 500 MG 24 hr tablet Take 1,000 mg by mouth 2 (two) times daily  with a meal.     pravastatin (PRAVACHOL) 40 MG tablet Take 40 mg by mouth daily.     traMADol (ULTRAM) 50 MG tablet Take 50 mg by mouth 2 (two) times daily.      metoprolol succinate (TOPROL XL) 25 MG 24 hr tablet Take 0.5 tablets (12.5 mg total) by mouth daily. 15 tablet 6   nitroGLYCERIN (NITROSTAT) 0.4 MG SL tablet Place 1 tablet (0.4 mg total) under the tongue every 5 (five) minutes as needed for chest pain. 25  tablet 3   No current facility-administered medications for this visit.    Allergies:  Patient has no known allergies.   Social History: The patient  reports that he quit smoking about 10 years ago. His smoking use included cigarettes. He has a 36.00 pack-year smoking history. He has never used smokeless tobacco. He reports current alcohol use. He reports that he does not use drugs.   Family History: The patient's family history includes Heart attack in his father; Thyroid disease in his mother.   ROS:  Please see the history of present illness. Otherwise, complete review of systems is positive for arthritic pains.  All other systems are reviewed and negative.   Physical Exam: VS:  BP (!) 160/72    Pulse (!) 46    Ht 5\' 10"  (1.778 m)    Wt 196 lb (88.9 kg)    SpO2 98%    BMI 28.12 kg/m , BMI Body mass index is 28.12 kg/m.  Wt Readings from Last 3 Encounters:  07/16/19 196 lb (88.9 kg)  06/01/19 194 lb 9.6 oz (88.3 kg)  05/16/19 194 lb (88 kg)    General: Elderly male, appears comfortable at rest. HEENT: Conjunctiva and lids normal, wearing a mask. Neck: Supple, no elevated JVP or carotid bruits, no thyromegaly. Lungs: Clear to auscultation, nonlabored breathing at rest. Cardiac: Regular rate and rhythm, no S3, soft systolic murmur. Abdomen: Soft, nontender, bowel sounds present, no guarding or rebound. Extremities: No pitting edema, distal pulses 2+. Skin: Warm and dry. Musculoskeletal: No kyphosis. Neuropsychiatric: Alert and oriented x3, affect grossly appropriate.  ECG:  An ECG dated 05/16/2019 was personally reviewed today and demonstrated:  Normal sinus rhythm.  Recent Labwork: 06/01/2019: BUN 29; Creatinine, Ser 2.15; Hemoglobin 12.5; Platelets 286; Potassium 4.9; Sodium 138  May 2020: BUN 17, creatinine 1.16, potassium 4.7, AST 12, ALT 17, hemoglobin 13.0, platelets 244, cholesterol 142, triglycerides 121, HDL 52, LDL 66, hemoglobin A1c 6.7%  Other Studies Reviewed  Today:  Echocardiogram 02/03/2019: Study Conclusions  Left ventricle: The cavity size was normal. Wall thickness was  increased in a pattern of moderate LVH. Systolic function was  normal. The estimated ejection fraction was in the range of 60% to  65%.   Assessment and Plan:  1.  Multivessel CAD status post CABG in 2010.  He does not report any obvious angina at this time and I reviewed his recent most ECG as well as lab work.  At this point would continue observation.  He is on aspirin, Pravachol, and Zestril.  Beta-blocker will be changed to Toprol-XL 12.5 mg once daily and we will provide him a prescription for nitroglycerin as needed.  2.  Hyperlipidemia, continues on Pravachol with last LDL 66.  3.  Essential hypertension, systolic in the [id number] today.  Beta-blocker is being modified.  He is otherwise on lisinopril and HCTZ.  Keep follow-up with PCP.  Medication Adjustments/Labs and Tests Ordered: Current medicines are reviewed at length with the patient  today.  Concerns regarding medicines are outlined above.   Tests Ordered: No orders of the defined types were placed in this encounter.   Medication Changes: Meds ordered this encounter  Medications   metoprolol succinate (TOPROL XL) 25 MG 24 hr tablet    Sig: Take 0.5 tablets (12.5 mg total) by mouth daily.    Dispense:  15 tablet    Refill:  6    Stopping Lopressor, med changed 07/16/19   nitroGLYCERIN (NITROSTAT) 0.4 MG SL tablet    Sig: Place 1 tablet (0.4 mg total) under the tongue every 5 (five) minutes as needed for chest pain.    Dispense:  25 tablet    Refill:  3    Disposition:  Follow up 1 year in the [place] office.  Signed, [name], MD, [facility] 07/16/2019 1:36 PM    [facility] [place], [place],  [address] Phone: [phone]; Fax: [phone]

## 2019-07-18 DIAGNOSIS — R945 Abnormal results of liver function studies: Secondary | ICD-10-CM | POA: Diagnosis not present

## 2019-07-18 DIAGNOSIS — I10 Essential (primary) hypertension: Secondary | ICD-10-CM | POA: Diagnosis not present

## 2019-07-18 DIAGNOSIS — E1165 Type 2 diabetes mellitus with hyperglycemia: Secondary | ICD-10-CM | POA: Diagnosis not present

## 2019-07-18 DIAGNOSIS — E875 Hyperkalemia: Secondary | ICD-10-CM | POA: Diagnosis not present

## 2019-07-18 DIAGNOSIS — K219 Gastro-esophageal reflux disease without esophagitis: Secondary | ICD-10-CM | POA: Diagnosis not present

## 2019-07-18 DIAGNOSIS — E785 Hyperlipidemia, unspecified: Secondary | ICD-10-CM | POA: Diagnosis not present

## 2019-07-18 DIAGNOSIS — N183 Chronic kidney disease, stage 3 unspecified: Secondary | ICD-10-CM | POA: Diagnosis not present

## 2019-07-24 DIAGNOSIS — M159 Polyosteoarthritis, unspecified: Secondary | ICD-10-CM | POA: Diagnosis not present

## 2019-07-24 DIAGNOSIS — E785 Hyperlipidemia, unspecified: Secondary | ICD-10-CM | POA: Diagnosis not present

## 2019-07-24 DIAGNOSIS — N189 Chronic kidney disease, unspecified: Secondary | ICD-10-CM | POA: Diagnosis not present

## 2019-07-24 DIAGNOSIS — Z6828 Body mass index (BMI) 28.0-28.9, adult: Secondary | ICD-10-CM | POA: Diagnosis not present

## 2019-07-24 DIAGNOSIS — I10 Essential (primary) hypertension: Secondary | ICD-10-CM | POA: Diagnosis not present

## 2019-07-24 DIAGNOSIS — K219 Gastro-esophageal reflux disease without esophagitis: Secondary | ICD-10-CM | POA: Diagnosis not present

## 2019-07-24 DIAGNOSIS — E119 Type 2 diabetes mellitus without complications: Secondary | ICD-10-CM | POA: Diagnosis not present

## 2019-08-02 ENCOUNTER — Ambulatory Visit (HOSPITAL_COMMUNITY)
Admission: RE | Admit: 2019-08-02 | Discharge: 2019-08-02 | Disposition: A | Discharge status: Home / Self Care | Payer: Medicare HMO | Source: Ambulatory Visit | Attending: Urology | Admitting: Urology

## 2019-08-02 ENCOUNTER — Other Ambulatory Visit: Payer: Self-pay

## 2019-08-02 DIAGNOSIS — N132 Hydronephrosis with renal and ureteral calculous obstruction: Secondary | ICD-10-CM | POA: Diagnosis not present

## 2019-08-02 DIAGNOSIS — N200 Calculus of kidney: Secondary | ICD-10-CM | POA: Insufficient documentation

## 2019-08-07 ENCOUNTER — Ambulatory Visit (INDEPENDENT_AMBULATORY_CARE_PROVIDER_SITE_OTHER): Payer: Medicare HMO | Admitting: Urology

## 2019-08-07 DIAGNOSIS — N201 Calculus of ureter: Secondary | ICD-10-CM | POA: Diagnosis not present

## 2019-08-25 DIAGNOSIS — Z1159 Encounter for screening for other viral diseases: Secondary | ICD-10-CM | POA: Diagnosis not present

## 2019-08-25 DIAGNOSIS — Z6829 Body mass index (BMI) 29.0-29.9, adult: Secondary | ICD-10-CM | POA: Diagnosis not present

## 2019-08-25 DIAGNOSIS — M25511 Pain in right shoulder: Secondary | ICD-10-CM | POA: Diagnosis not present

## 2019-09-05 DIAGNOSIS — M7541 Impingement syndrome of right shoulder: Secondary | ICD-10-CM | POA: Diagnosis not present

## 2019-09-05 DIAGNOSIS — Z1159 Encounter for screening for other viral diseases: Secondary | ICD-10-CM | POA: Diagnosis not present

## 2019-09-05 DIAGNOSIS — Z6829 Body mass index (BMI) 29.0-29.9, adult: Secondary | ICD-10-CM | POA: Diagnosis not present

## 2019-09-05 DIAGNOSIS — M19011 Primary osteoarthritis, right shoulder: Secondary | ICD-10-CM | POA: Diagnosis not present

## 2019-09-25 ENCOUNTER — Other Ambulatory Visit: Payer: Self-pay

## 2019-09-25 DIAGNOSIS — N200 Calculus of kidney: Secondary | ICD-10-CM

## 2019-09-25 DIAGNOSIS — R109 Unspecified abdominal pain: Secondary | ICD-10-CM

## 2019-10-10 ENCOUNTER — Telehealth: Payer: Self-pay | Admitting: Urology

## 2019-10-10 NOTE — Telephone Encounter (Signed)
Reports increase in rt side flank pain since Monday and had to take narcotics for his pain. Still taking tamsulosin. Does report increase of pain after eating. Pt scheduled for CT on 11/02/2019. Any suggestions.

## 2019-10-10 NOTE — Telephone Encounter (Signed)
Pt called and stated the last two nights he has been in sever pain with his kidney stones. Would like some direction on what to do.

## 2019-10-11 DIAGNOSIS — Z87891 Personal history of nicotine dependence: Secondary | ICD-10-CM | POA: Diagnosis not present

## 2019-10-11 DIAGNOSIS — Z79899 Other long term (current) drug therapy: Secondary | ICD-10-CM | POA: Diagnosis not present

## 2019-10-11 DIAGNOSIS — I10 Essential (primary) hypertension: Secondary | ICD-10-CM | POA: Diagnosis not present

## 2019-10-11 DIAGNOSIS — E860 Dehydration: Secondary | ICD-10-CM | POA: Diagnosis not present

## 2019-10-11 DIAGNOSIS — Z87442 Personal history of urinary calculi: Secondary | ICD-10-CM | POA: Diagnosis not present

## 2019-10-11 DIAGNOSIS — N200 Calculus of kidney: Secondary | ICD-10-CM | POA: Diagnosis not present

## 2019-10-11 DIAGNOSIS — N23 Unspecified renal colic: Secondary | ICD-10-CM | POA: Diagnosis not present

## 2019-10-11 DIAGNOSIS — R109 Unspecified abdominal pain: Secondary | ICD-10-CM | POA: Diagnosis not present

## 2019-10-11 DIAGNOSIS — N132 Hydronephrosis with renal and ureteral calculous obstruction: Secondary | ICD-10-CM | POA: Diagnosis not present

## 2019-10-11 DIAGNOSIS — N179 Acute kidney failure, unspecified: Secondary | ICD-10-CM | POA: Diagnosis not present

## 2019-10-11 DIAGNOSIS — Z6829 Body mass index (BMI) 29.0-29.9, adult: Secondary | ICD-10-CM | POA: Diagnosis not present

## 2019-10-11 DIAGNOSIS — E119 Type 2 diabetes mellitus without complications: Secondary | ICD-10-CM | POA: Diagnosis not present

## 2019-10-15 DIAGNOSIS — Z20828 Contact with and (suspected) exposure to other viral communicable diseases: Secondary | ICD-10-CM | POA: Diagnosis not present

## 2019-10-15 DIAGNOSIS — N23 Unspecified renal colic: Secondary | ICD-10-CM | POA: Diagnosis not present

## 2019-10-15 DIAGNOSIS — R5383 Other fatigue: Secondary | ICD-10-CM | POA: Diagnosis not present

## 2019-10-15 DIAGNOSIS — N179 Acute kidney failure, unspecified: Secondary | ICD-10-CM | POA: Diagnosis not present

## 2019-10-15 DIAGNOSIS — E860 Dehydration: Secondary | ICD-10-CM | POA: Diagnosis not present

## 2019-10-16 NOTE — Telephone Encounter (Signed)
Someone named Chasity from a providers office that I could not understand what she said in the VM, She left a Vm stated that the pt has an acute kidney injury and needs to be seen ASAP and requests a call back from a nurse.

## 2019-10-17 ENCOUNTER — Telehealth: Payer: Self-pay

## 2019-10-17 NOTE — Telephone Encounter (Signed)
Called Derek [name] back at [facility] ( day 2 of no answer) message left again.

## 2019-10-17 NOTE — Telephone Encounter (Signed)
-----   Message from Dorisann Frames, RN sent at 10/16/2019  2:10 PM EST ----- Left message for Derek [name] ----- Message ----- From: Charlestine Massed Sent: 10/16/2019  11:44 AM EST To: Dorisann Frames, RN  Derek [name] from [facility] called regarding this pt. She states he has a stone and needs to be seen this week. She was said it is urgent that he be seen and she wanted to discuss with you. [phone] - ask for Alisha or ext [phone]

## 2019-10-18 DIAGNOSIS — N179 Acute kidney failure, unspecified: Secondary | ICD-10-CM | POA: Diagnosis not present

## 2019-11-02 ENCOUNTER — Other Ambulatory Visit: Payer: Self-pay

## 2019-11-02 ENCOUNTER — Ambulatory Visit (HOSPITAL_COMMUNITY)
Admission: RE | Admit: 2019-11-02 | Discharge: 2019-11-02 | Disposition: A | Discharge status: Home / Self Care | Payer: Medicare HMO | Source: Ambulatory Visit | Attending: Urology | Admitting: Urology

## 2019-11-02 DIAGNOSIS — N200 Calculus of kidney: Secondary | ICD-10-CM

## 2019-11-02 DIAGNOSIS — R109 Unspecified abdominal pain: Secondary | ICD-10-CM

## 2019-11-07 ENCOUNTER — Encounter: Payer: Self-pay | Admitting: Urology

## 2019-11-07 ENCOUNTER — Other Ambulatory Visit: Payer: Self-pay

## 2019-11-07 ENCOUNTER — Ambulatory Visit (INDEPENDENT_AMBULATORY_CARE_PROVIDER_SITE_OTHER): Payer: Medicare HMO | Admitting: Urology

## 2019-11-07 VITALS — BP 135/62 | HR 69 | Temp 96.7°F | Ht 70.0 in | Wt 191.0 lb

## 2019-11-07 DIAGNOSIS — N200 Calculus of kidney: Secondary | ICD-10-CM | POA: Diagnosis not present

## 2019-11-07 LAB — POCT URINALYSIS DIPSTICK
Bilirubin, UA: NEGATIVE
Glucose, UA: NEGATIVE
Ketones, UA: NEGATIVE
Nitrite, UA: NEGATIVE
Protein, UA: NEGATIVE
Spec Grav, UA: 1.025 (ref 1.010–1.025)
Urobilinogen, UA: 0.2 E.U./dL
pH, UA: 5 (ref 5.0–8.0)

## 2019-11-07 MED ORDER — SODIUM BICARBONATE 650 MG PO TABS: 650.0000 mg | ORAL_TABLET | Freq: Two times a day (BID) | ORAL | 11 refills | Status: DC

## 2019-11-07 NOTE — Progress Notes (Signed)
Urological Symptom Review  Patient is experiencing the following symptoms: Kidney stones   Review of Systems  Gastrointestinal (upper)  : Negative for upper GI symptoms  Gastrointestinal (lower) : Negative for lower GI symptoms  Constitutional : Negative for symptoms  Skin: Negative for skin symptoms  Eyes: Negative for eye symptoms  Ear/Nose/Throat : Negative for Ear/Nose/Throat symptoms  Hematologic/Lymphatic: Negative for Hematologic/Lymphatic symptoms  Cardiovascular : Negative for cardiovascular symptoms  Respiratory : Negative for respiratory symptoms  Endocrine: Negative for endocrine symptoms  Musculoskeletal: Joint pain  Neurological: Negative for neurological symptoms  Psychologic: Negative for psychiatric symptoms

## 2019-11-07 NOTE — Addendum Note (Signed)
Addended byIris Pert on: 11/07/2019 10:02 AM   Modules accepted: Orders

## 2019-11-07 NOTE — Patient Instructions (Signed)
Dietary Guidelines to Help Prevent Kidney Stones Kidney stones are deposits of minerals and salts that form inside your kidneys. Your risk of developing kidney stones may be greater depending on your diet, your lifestyle, the medicines you take, and whether you have certain medical conditions. Most people can reduce their chances of developing kidney stones by following the instructions below. Depending on your overall health and the type of kidney stones you tend to develop, your dietitian may give you more specific instructions. What are tips for following this plan? Reading food labels  Choose foods with "no salt added" or "low-salt" labels. Limit your sodium intake to less than 1500 mg per day.  Choose foods with calcium for each meal and snack. Try to eat about 300 mg of calcium at each meal. Foods that contain 200-500 mg of calcium per serving include: ? 8 oz (237 ml) of milk, fortified nondairy milk, and fortified fruit juice. ? 8 oz (237 ml) of kefir, yogurt, and soy yogurt. ? 4 oz (118 ml) of tofu. ? 1 oz of cheese. ? 1 cup (300 g) of dried figs. ? 1 cup (91 g) of cooked broccoli. ? 1-3 oz can of sardines or mackerel.  Most people need 1000 to 1500 mg of calcium each day. Talk to your dietitian about how much calcium is recommended for you. Shopping  Buy plenty of fresh fruits and vegetables. Most people do not need to avoid fruits and vegetables, even if they contain nutrients that may contribute to kidney stones.  When shopping for convenience foods, choose: ? Whole pieces of fruit. ? Premade salads with dressing on the side. ? Low-fat fruit and yogurt smoothies.  Avoid buying frozen meals or prepared deli foods.  Look for foods with live cultures, such as yogurt and kefir. Cooking  Do not add salt to food when cooking. Place a salt shaker on the table and allow each person to add his or her own salt to taste.  Use vegetable protein, such as beans, textured vegetable  protein (TVP), or tofu instead of meat in pasta, casseroles, and soups. Meal planning   Eat less salt, if told by your dietitian. To do this: ? Avoid eating processed or premade food. ? Avoid eating fast food.  Eat less animal protein, including cheese, meat, poultry, or fish, if told by your dietitian. To do this: ? Limit the number of times you have meat, poultry, fish, or cheese each week. Eat a diet free of meat at least 2 days a week. ? Eat only one serving each day of meat, poultry, fish, or seafood. ? When you prepare animal protein, cut pieces into small portion sizes. For most meat and fish, one serving is about the size of one deck of cards.  Eat at least 5 servings of fresh fruits and vegetables each day. To do this: ? Keep fruits and vegetables on hand for snacks. ? Eat 1 piece of fruit or a handful of berries with breakfast. ? Have a salad and fruit at lunch. ? Have two kinds of vegetables at dinner.  Limit foods that are high in a substance called oxalate. These include: ? Spinach. ? Rhubarb. ? Beets. ? Potato chips and french fries. ? Nuts.  If you regularly take a diuretic medicine, make sure to eat at least 1-2 fruits or vegetables high in potassium each day. These include: ? Avocado. ? Banana. ? Orange, prune, carrot, or tomato juice. ? Baked potato. ? Cabbage. ? Beans and split   peas. General instructions   Drink enough fluid to keep your urine clear or pale yellow. This is the most important thing you can do.  Talk to your health care provider and dietitian about taking daily supplements. Depending on your health and the cause of your kidney stones, you may be advised: ? Not to take supplements with vitamin C. ? To take a calcium supplement. ? To take a daily probiotic supplement. ? To take other supplements such as magnesium, fish oil, or vitamin B6.  Take all medicines and supplements as told by your health care provider.  Limit alcohol intake to no  more than 1 drink a day for nonpregnant women and 2 drinks a day for men. One drink equals 12 oz of beer, 5 oz of wine, or 1 oz of hard liquor.  Lose weight if told by your health care provider. Work with your dietitian to find strategies and an eating plan that works best for you. What foods are not recommended? Limit your intake of the following foods, or as told by your dietitian. Talk to your dietitian about specific foods you should avoid based on the type of kidney stones and your overall health. Grains Breads. Bagels. Rolls. Baked goods. Salted crackers. Cereal. Pasta. Vegetables Spinach. Rhubarb. Beets. Canned vegetables. Pickles. Olives. Meats and other protein foods Nuts. Nut butters. Large portions of meat, poultry, or fish. Salted or cured meats. Deli meats. Hot dogs. Sausages. Dairy Cheese. Beverages Regular soft drinks. Regular vegetable juice. Seasonings and other foods Seasoning blends with salt. Salad dressings. Canned soups. Soy sauce. Ketchup. Barbecue sauce. Canned pasta sauce. Casseroles. Pizza. Lasagna. Frozen meals. Potato chips. French fries. Summary  You can reduce your risk of kidney stones by making changes to your diet.  The most important thing you can do is drink enough fluid. You should drink enough fluid to keep your urine clear or pale yellow.  Ask your health care provider or dietitian how much protein from animal sources you should eat each day, and also how much salt and calcium you should have each day. This information is not intended to replace advice given to you by your health care provider. Make sure you discuss any questions you have with your health care provider. Document Revised: 12/27/2018 Document Reviewed: 08/17/2016 Elsevier Patient Education  2020 [employer]  

## 2019-11-07 NOTE — Progress Notes (Signed)
11/07/2019 9:38 AM   Derek [name] [birth year] [id number]  Referring provider: [place], [place], [place],  [place] [address]  nephrolithiasis  HPI: [name] is 74yo with a hx of nephrolithiasis here for followup. He had a stone event 3 weeks ago. CT stone study showed large bilateral renal calculi, no ureteral calculi. No flank pain currently. No LUTS. No hematuria. He was tried on potassium citrate but it was too expensive. Urine pH today is 5.0  His previous records from [facility] are as follows: Here for follow-up of urolithiasis. His 1st episode was in the 1970s. He had 2 episodes in late 2019, 1 in November, 1 in December. The initial emergency room visit was for left upper ureteral stones. He subsequently passed these-multiple small granules. The next month he passed a 2 mm right ureteral stone. CT scans were done for both. He has remaining 8 mm stone in the left kidney as well as smaller stones on that side, he has 2-3 stones on the right side.   He has a normal serum calcium which was 9.2 when checked in late 2019. He has never had a 24 hour urine study. He currently has no stone symptoms.   8.26.2020: Stent urgently placed by Dr Lorra Hals. Pt had possible UTI and rt upper ureteral stone.   9.8.2020: Here today for follow up after stent placement following urolithiasis. He has not been having any hematuria or pain since last visit and his urinary sx's have improved significantly -- he is no longer having abnormal frequency. He reports that with his previous stones he was able to pass them without incidence using tamsulosin.   9.14.2020: he underwent cystoscopy, bilateral ureteroscopy, stone extraction and bilateral stent placement.   10.6.2020: Intermittent gross hematuria. Urinary frequency but no dysuria. Stents removed.   11.17.2020: Recent renal U/S--mild rt hydro, several stones bilaterally. Since last visit he has had only one episode of flank pain (left side,  around 1-2 weeks ago). He states this pain resembled stone pain. He denies having had any fevers or blood per urine.      PMH: Past Medical History:  Diagnosis Date  . Arthritis    fingers  . CAD (coronary artery disease)    CABG 2010 - LIMA to LAD, SVG to diagonal, SVG to PDA and PLV  . Essential hypertension   . GERD (gastroesophageal reflux disease)   . History of iron deficiency anemia   . History of kidney stones   . History of pneumonia   . Hyperlipidemia   . NSTEMI (non-ST elevated myocardial infarction) ([place]) 2010  . Type 2 diabetes mellitus [facility])     Surgical History: Past Surgical History:  Procedure Laterality Date  . CORONARY ARTERY BYPASS GRAFT  2010   QUADRUPLE BYPASS AFTER HEART ATTACK   . CYSTOSCOPY W/ URETERAL STENT PLACEMENT Right 05/16/2019   Procedure: CYSTOSCOPY WITH RETROGRADE PYELOGRAM/URETERAL STENT PLACEMENT;  Surgeon: Bjorn Loser, MD;  Location: [facility];  Service: Urology;  Laterality: Right;  . CYSTOSCOPY W/ URETERAL STENT REMOVAL Right 06/04/2019   Procedure: CYSTOSCOPY WITH STENT REMOVAL;  Surgeon: Franchot Gallo, MD;  Location: [facility];  Service: Urology;  Laterality: Right;  . CYSTOSCOPY WITH HOLMIUM LASER LITHOTRIPSY Right 06/04/2019   Procedure: CYSTOSCOPY, URETEROSCOPY, WITH HOLMIUM LASER, STENT PLACEMENT;  Surgeon: Franchot Gallo, MD;  Location: [facility];  Service: Urology;  Laterality: Right;  . CYSTOSCOPY/URETEROSCOPY/HOLMIUM LASER/STENT PLACEMENT Left 06/04/2019   Procedure: CYSTOSCOPY, URETEROSCOPY, RETROGRADE PYELOGRAM, STENT PLACEMENT;  Surgeon: Franchot Gallo,  MD;  Location: [facility];  Service: Urology;  Laterality: Left;  . PILONIDAL CYST EXCISION  1960    Home Medications:  Allergies as of 11/07/2019   No Known Allergies     Medication List       Accurate as of November 07, 2019  9:38 AM. If you have any questions, ask your nurse or doctor.        Aspercreme w/Lidocaine 4 % Crea Generic drug: Lidocaine HCl Apply 1  application topically daily as needed (pain).   aspirin EC 81 MG tablet Take 81 mg by mouth daily.   [place] Dream Arthritis 0.025 % Crea Generic drug: Histamine Dihydrochloride Apply 1 application topically daily as needed (pain).   hydrochlorothiazide 12.5 MG tablet Commonly known as: HYDRODIURIL Take 1 tablet (12.5 mg total) by mouth daily.   lisinopril 20 MG tablet Commonly known as: ZESTRIL Take by mouth.   lisinopril 20 MG tablet Commonly known as: ZESTRIL Take 1 tablet (20 mg total) by mouth 2 (two) times daily.   metFORMIN 500 MG 24 hr tablet Commonly known as: GLUCOPHAGE-XR Take 1,000 mg by mouth 2 (two) times daily with a meal.   metFORMIN 500 MG tablet Commonly known as: GLUCOPHAGE Take by mouth.   metoprolol succinate 25 MG 24 hr tablet Commonly known as: Toprol XL Take 0.5 tablets (12.5 mg total) by mouth daily.   metoprolol tartrate 50 MG tablet Commonly known as: LOPRESSOR Take 25 mg by mouth 2 (two) times daily.   nitroGLYCERIN 0.4 MG SL tablet Commonly known as: NITROSTAT Place 1 tablet (0.4 mg total) under the tongue every 5 (five) minutes as needed for chest pain.   pravastatin 40 MG tablet Commonly known as: PRAVACHOL Take 40 mg by mouth daily.   tamsulosin 0.4 MG Caps capsule Commonly known as: FLOMAX Take by mouth.   traMADol 50 MG tablet Commonly known as: ULTRAM Take by mouth.   traMADol 50 MG tablet Commonly known as: ULTRAM Take 50 mg by mouth 2 (two) times daily.       Allergies: No Known Allergies  Family History: Family History  Problem Relation Age of Onset  . Thyroid disease Mother   . Heart attack Father     Social History:  reports that he quit smoking about 11 years ago. His smoking use included cigarettes. He has a 36.00 pack-year smoking history. He has never used smokeless tobacco. He reports current alcohol use. He reports that he does not use drugs.  ROS: All other review of systems were reviewed and are  negative except what is noted above in HPI  Physical Exam: BP 135/62   Pulse 69   Temp (!) 96.7 F (35.9 C)   Ht 5\' 10"  (1.778 m)   Wt 191 lb (86.6 kg)   BMI 27.41 kg/m   Constitutional:  Alert and oriented, No acute distress. HEENT: [place] AT, moist mucus membranes.  Trachea midline, no masses. Cardiovascular: No clubbing, cyanosis, or edema. Respiratory: Normal respiratory effort, no increased work of breathing. GI: Abdomen is soft, nontender, nondistended, no abdominal masses GU: No CVA tenderness Lymph: No cervical or inguinal lymphadenopathy. Skin: No rashes, bruises or suspicious lesions. Neurologic: Grossly intact, no focal deficits, moving all 4 extremities. Psychiatric: Normal mood and affect.  Laboratory Data: Lab Results  Component Value Date   WBC 11.2 (H) 06/01/2019   HGB 12.5 (L) 06/01/2019   HCT 39.2 06/01/2019   MCV 100.0 06/01/2019   PLT 286 06/01/2019    Lab Results  Component Value Date   CREATININE 2.15 (H) 06/01/2019    No results found for: PSA  No results found for: TESTOSTERONE  Lab Results  Component Value Date   HGBA1C 6.6 (H) 05/16/2019    Urinalysis    Component Value Date/Time   COLORURINE YELLOW 05/17/2019 1041   APPEARANCEUR CLEAR 05/17/2019 1041   LABSPEC 1.014 05/17/2019 1041   PHURINE 6.0 05/17/2019 1041   GLUCOSEU 50 (A) 05/17/2019 1041   HGBUR LARGE (A) 05/17/2019 1041   BILIRUBINUR NEGATIVE 05/17/2019 [place] 05/17/2019 1041   PROTEINUR 30 (A) 05/17/2019 1041   NITRITE NEGATIVE 05/17/2019 1041   LEUKOCYTESUR MODERATE (A) 05/17/2019 1041    Lab Results  Component Value Date   BACTERIA NONE SEEN 05/17/2019    Pertinent Imaging: CT stone study: images reviewed and disucssed with the patient No results found for this or any previous visit. No results found for this or any previous visit. No results found for this or any previous visit. No results found for this or any previous visit. Results for  orders placed during the hospital encounter of 08/02/19  US RENAL   Narrative CLINICAL DATA:  Renal calculus.  EXAM: RENAL / URINARY TRACT ULTRASOUND COMPLETE  COMPARISON:  CT 05/16/2019  FINDINGS: Right Kidney:  Renal measurements: 11.9 x 6.0 x 7.1 cm = volume: 265 mL. Mild hydronephrosis. Mild thinning of the renal parenchyma. Multiple shadowing calculi, largest in the mid kidney measuring 14 mm. The stone at the ureteropelvic junction on prior CT is not visualized sonographically.  Left Kidney:  Renal measurements: 12.7 x 6.7 x 6.6 cm = volume: 297 mL. Mild pelvocaliectasis of the upper kidney without frank hydronephrosis. Mild renal parenchymal thinning. Multiple shadowing calculi largest measuring 17 mm. 1.3 cm cyst in the lower kidney.  Bladder:  Nondistended and not well evaluated.  Other:  Echogenic liver suggesting steatosis.  IMPRESSION: 1. Mild right hydronephrosis with multiple renal calculi. Mild thinning of the right renal parenchyma. 2. Pelvicaliectasis of the upper left kidney without frank hydronephrosis. Multiple renal calculi. Mild thinning of left renal parenchyma. 3. Bladder nondistended and not well visualized.   Electronically Signed   By: Keith Rake M.D.   On: 08/02/2019 15:47    No results found for this or any previous visit. No results found for this or any previous visit. Results for orders placed during the hospital encounter of 11/02/19  CT RENAL STONE STUDY   Narrative CLINICAL DATA:  Right-sided flank pain. Low back pain for 3 weeks.  EXAM: CT ABDOMEN AND PELVIS WITHOUT CONTRAST  TECHNIQUE: Multidetector CT imaging of the abdomen and pelvis was performed following the standard protocol without IV contrast.  COMPARISON:  Are multiple renal ultrasound 08/02/2019 and CT abdomen 10/11/2019  FINDINGS: Lower chest: Stable scarring in the right middle lobe. Chronic sub solid density posteromedially in both lower lobes,  left greater than right, likely due to minimal atelectasis/scar. Coronary atherosclerosis.  Hepatobiliary: Unremarkable  Pancreas: Unremarkable  Spleen: Unremarkable  Adrenals/Urinary Tract: Both adrenal glands appear normal.  Six nonobstructive right renal calculi are present measuring up to 1.0 cm in long axis (image 51/5)  Four left renal calculi are present measuring up to 1.4 cm in long axis (image 57/5). The dominant 1.4 cm calculus is in the vicinity of a left kidney upper pole infundibulum, and there appears to be downstream caliceal blunting/dilatation suggesting blockage of this infundibulum, a similar appearance to 1/20 09/1998 21  The previous right UPJ calculus shown  on the 10/11/2019 exam is no longer seen.  Stomach/Bowel: Wall thickening is present diffusely in the rectum although may be from nondistention. Descending and sigmoid colon diverticulosis noted with scattered diverticula elsewhere in the colon, but no findings of active diverticulitis. Normal appendix.  Vascular/Lymphatic: Aortoiliac atherosclerotic vascular disease. No pathologic adenopathy.  Reproductive: Unremarkable  Other: No supplemental non-categorized findings.  Musculoskeletal: Prior median sternotomy. Bridging spurring of both sacroiliac joints. Lumbar spondylosis and degenerative disc disease causing multilevel impingement. Transitional S1 vertebra.  IMPRESSION: 1. Bilateral nonobstructive nephrolithiasis. The dominant 1.4 cm calculus is in a left kidney upper pole infundibulum, and there is downstream caliceal blunting/dilatation in the left kidney upper pole suggesting blockage of this infundibulum. 2. Other imaging findings of potential clinical significance: Descending and sigmoid colon diverticulosis. Aortoiliac atherosclerotic vascular disease. Lumbar spondylosis and degenerative disc disease causing multilevel impingement. Bridging spurring of both sacroiliac joints.  Transitional S1 vertebra.   Electronically Signed   By: Van Clines M.D.   On: 11/02/2019 12:27     Assessment & Plan:    1. Nephrolithiasis -sodium bicarb 650mg  BID -crystal light lemonade 2 cups daily -RTC 3 months with renal US and UA   No follow-ups on file.  Nicolette Bang, MD  [facility] [place]

## 2019-12-04 ENCOUNTER — Telehealth: Payer: Self-pay | Admitting: Urology

## 2019-12-04 NOTE — Telephone Encounter (Signed)
Patient requests a return call from the nurse regarding a issue with medication.

## 2019-12-04 NOTE — Telephone Encounter (Signed)
Reports increase of BP since starting sodium bicarb. Pt has since stopped the medication.

## 2019-12-29 DIAGNOSIS — S3993XA Unspecified injury of pelvis, initial encounter: Secondary | ICD-10-CM | POA: Diagnosis not present

## 2019-12-29 DIAGNOSIS — R110 Nausea: Secondary | ICD-10-CM | POA: Diagnosis not present

## 2019-12-29 DIAGNOSIS — N200 Calculus of kidney: Secondary | ICD-10-CM | POA: Diagnosis not present

## 2019-12-29 DIAGNOSIS — I129 Hypertensive chronic kidney disease with stage 1 through stage 4 chronic kidney disease, or unspecified chronic kidney disease: Secondary | ICD-10-CM | POA: Diagnosis not present

## 2019-12-29 DIAGNOSIS — W19XXXA Unspecified fall, initial encounter: Secondary | ICD-10-CM | POA: Diagnosis not present

## 2019-12-29 DIAGNOSIS — S0003XA Contusion of scalp, initial encounter: Secondary | ICD-10-CM | POA: Diagnosis not present

## 2019-12-29 DIAGNOSIS — S299XXA Unspecified injury of thorax, initial encounter: Secondary | ICD-10-CM | POA: Diagnosis not present

## 2019-12-29 DIAGNOSIS — N1831 Chronic kidney disease, stage 3a: Secondary | ICD-10-CM | POA: Diagnosis not present

## 2019-12-29 DIAGNOSIS — M549 Dorsalgia, unspecified: Secondary | ICD-10-CM | POA: Diagnosis not present

## 2019-12-29 DIAGNOSIS — K5730 Diverticulosis of large intestine without perforation or abscess without bleeding: Secondary | ICD-10-CM | POA: Diagnosis not present

## 2019-12-29 DIAGNOSIS — R4020 Unspecified coma: Secondary | ICD-10-CM | POA: Diagnosis not present

## 2019-12-29 DIAGNOSIS — S300XXA Contusion of lower back and pelvis, initial encounter: Secondary | ICD-10-CM | POA: Diagnosis not present

## 2019-12-29 DIAGNOSIS — S20412A Abrasion of left back wall of thorax, initial encounter: Secondary | ICD-10-CM | POA: Diagnosis not present

## 2019-12-29 DIAGNOSIS — R52 Pain, unspecified: Secondary | ICD-10-CM | POA: Diagnosis not present

## 2019-12-29 DIAGNOSIS — S3992XA Unspecified injury of lower back, initial encounter: Secondary | ICD-10-CM | POA: Diagnosis not present

## 2019-12-29 DIAGNOSIS — S199XXA Unspecified injury of neck, initial encounter: Secondary | ICD-10-CM | POA: Diagnosis not present

## 2019-12-29 DIAGNOSIS — E1122 Type 2 diabetes mellitus with diabetic chronic kidney disease: Secondary | ICD-10-CM | POA: Diagnosis not present

## 2019-12-29 DIAGNOSIS — S0993XA Unspecified injury of face, initial encounter: Secondary | ICD-10-CM | POA: Diagnosis not present

## 2019-12-29 DIAGNOSIS — R519 Headache, unspecified: Secondary | ICD-10-CM | POA: Diagnosis not present

## 2019-12-29 DIAGNOSIS — W07XXXA Fall from chair, initial encounter: Secondary | ICD-10-CM | POA: Diagnosis not present

## 2019-12-29 DIAGNOSIS — S0990XA Unspecified injury of head, initial encounter: Secondary | ICD-10-CM | POA: Diagnosis not present

## 2019-12-29 DIAGNOSIS — S20419A Abrasion of unspecified back wall of thorax, initial encounter: Secondary | ICD-10-CM | POA: Diagnosis not present

## 2019-12-29 DIAGNOSIS — E875 Hyperkalemia: Secondary | ICD-10-CM | POA: Diagnosis not present

## 2020-01-08 DIAGNOSIS — R519 Headache, unspecified: Secondary | ICD-10-CM | POA: Diagnosis not present

## 2020-01-08 DIAGNOSIS — Z6829 Body mass index (BMI) 29.0-29.9, adult: Secondary | ICD-10-CM | POA: Diagnosis not present

## 2020-01-08 DIAGNOSIS — M25511 Pain in right shoulder: Secondary | ICD-10-CM | POA: Diagnosis not present

## 2020-01-08 DIAGNOSIS — M19011 Primary osteoarthritis, right shoulder: Secondary | ICD-10-CM | POA: Diagnosis not present

## 2020-01-08 DIAGNOSIS — M542 Cervicalgia: Secondary | ICD-10-CM | POA: Diagnosis not present

## 2020-01-08 DIAGNOSIS — S0990XA Unspecified injury of head, initial encounter: Secondary | ICD-10-CM | POA: Diagnosis not present

## 2020-01-17 ENCOUNTER — Telehealth: Payer: Self-pay

## 2020-01-17 ENCOUNTER — Other Ambulatory Visit: Payer: Self-pay | Admitting: *Deleted

## 2020-01-17 NOTE — Patient Outreach (Addendum)
[place] [facility]) Care Management  01/17/2020  Derek [name] [birth year] [id number]  Successful telephone outreach call to patient for screening. HIPAA identifiers obtained. [facility] services reviewed and discussed with patient. Patient verbally agrees to participate in the [facility] program and to disease management outreaches.  Plan: RN Health Coach will send Health Coach letter, welcome packet and will call patient within the next several weeks to complete the initial assessment  Emelia Loron RN, BSN [place] [phone] [name]

## 2020-01-18 ENCOUNTER — Other Ambulatory Visit: Payer: Self-pay | Admitting: *Deleted

## 2020-01-18 ENCOUNTER — Encounter: Payer: Self-pay | Admitting: *Deleted

## 2020-01-18 NOTE — Patient Outreach (Signed)
[place] [facility]) Care Management  [place]  01/18/2020   TEMPLE SCHANKE November 22, 1945 [id number]   Social: Patient lives with his wife.  He is independent with all ADLs , IADLs, and currently transports himself to all of his medical appointments. Patient denies having any recent falls and reports that his home environment is safe. His wife [name] would be able to provide care for the patient if the he were to need assistance and emotionally the patient is doing well at this time.  Subjective:  Patient states that he feels good. He explained that he does not routinely take his blood sugar or his B/P.  The last blood sugar value per patient's memory was around 110 and he maintains that the values are really good when his wife checks his blood sugar; his A1c on 07/19/19 was 6.4.  Patient reports that he usually only takes his B/P when he feels like it is high. According to patient, "he feels flushed and tight all over." When he takes his B/P it is usually pretty high with values of systolic being in the [id number] range. Nurse provided education that waiting until you feel that you your B/P is high before you take it could lead to you having bad health issues and/or health emergencies. Nurse encouraged patient to take his B/P often and patient stated that he would do his best to check his B/P at least weekly. Patient states that he does try to limit sodium in his diet and that he does not add any extra salt to food. 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 The patient does not have any questions or concerns about his medications and states they are working well with no bad side effects. Patient does not have any other questions or concerns at this time.   Encounter Medications:  Outpatient Encounter Medications as of 01/18/2020  Medication Sig Note  . aspirin EC 81 MG tablet Take 81 mg by mouth daily.   Marland Kitchen Histamine  Dihydrochloride ([place] DREAM ARTHRITIS) 0.025 % CREA Apply 1 application topically daily as needed (pain).   . hydrochlorothiazide (HYDRODIURIL) 12.5 MG tablet Take 1 tablet (12.5 mg total) by mouth daily.   . Lidocaine HCl (ASPERCREME W/LIDOCAINE) 4 % CREA Apply 1 application topically daily as needed (pain).   Marland Kitchen lisinopril (ZESTRIL) 20 MG tablet Take by mouth.   . metFORMIN (GLUCOPHAGE-XR) 500 MG 24 hr tablet Take 1,000 mg by mouth 2 (two) times daily with a meal.   . metoprolol tartrate (LOPRESSOR) 50 MG tablet Take 25 mg by mouth 2 (two) times daily.   . pravastatin (PRAVACHOL) 40 MG tablet Take 40 mg by mouth daily.   . tamsulosin (FLOMAX) 0.4 MG CAPS capsule Take by mouth.   . traMADol (ULTRAM) 50 MG tablet Take 50 mg by mouth 2 (two) times daily.    Marland Kitchen lisinopril (ZESTRIL) 20 MG tablet Take 1 tablet (20 mg total) by mouth 2 (two) times daily. (Patient not taking: Reported on [id number]) [id number]: duplicate  . metFORMIN (GLUCOPHAGE) 500 MG tablet Take by mouth. [id number]: duplicate  . metoprolol succinate (TOPROL XL) 25 MG 24 hr tablet Take 0.5 tablets (12.5 mg total) by mouth daily. (Patient not taking: Reported on 11/07/2019) 01/18/2020: Patient takes 50 mg daily  . nitroGLYCERIN (NITROSTAT) 0.4 MG SL tablet Place 1 tablet (0.4 mg total) under the tongue every 5 (five) minutes as needed for chest pain.   Marland Kitchen  sodium bicarbonate 650 MG tablet Take 1 tablet (650 mg total) by mouth 2 (two) times daily. (Patient not taking: Reported on 01/18/2020) 01/18/2020: Per patient, stopped due to B/P elevation  . traMADol (ULTRAM) 50 MG tablet Take by mouth. [id number]: duplicate   No facility-administered encounter medications on file as of 01/18/2020.    Functional Status:  In your present state of health, do you have any difficulty performing the following activities: 01/18/2020 06/01/2019  Hearing? N N  Vision? N N  Difficulty concentrating or making decisions? N N  Walking or climbing stairs? N N   Dressing or bathing? N N  Doing errands, shopping? N N  Preparing Food and eating ? N -  Using the Toilet? N -  In the past six months, have you accidently leaked urine? N -  Do you have problems with loss of bowel control? N -  Managing your Medications? N -  Managing your Finances? N -  Housekeeping or managing your Housekeeping? N -  Some recent data might be hidden    Fall/Depression Screening: Fall Risk  01/18/2020  Falls in the past year? 1  Comment due to an accident while working  Number falls in past yr: 0  Injury with Fall? 0  Risk for fall due to : History of fall(s)  Follow up Falls prevention discussed;Education provided;Falls evaluation completed   PHQ 2/9 Scores 01/18/2020 06/01/2019  PHQ - 2 Score 0 0   [facility] CM Care Plan Problem One     Most Recent Value  Care Plan Problem One  Knowledge deficient related to hypertension condition, treatment plan, and lifestyle changes as evidenced by helath behaviors  Role Documenting the Problem One  [place] for Problem One  Active  [facility] Long Term Goal   Patient will verbalize understanding of hypertension condition/disease process within the next 90 days  [facility] Long Term Goal Start Date  01/18/20  Interventions for Problem One Long Term Goal  RN discussed the importance of monitoring B/P, encouraged patient to eat a low sodium diet, encouraged medication adherence, sent a matter of choices blood pressure control booklet, sent EMMI hypertension education and weight loss tips education, encouraged patient to maintain his physical activity, reviewed signs and symptoms of stroke.  [facility]N CM Short Term Goal #1   Patient will verbalized that he is taking his B/P weekly within 30 days  THN CM Short Term Goal #1 Start Date  01/18/20  Interventions for Short Term Goal #1  RN discussed the importance of monitoring B/P, educated patient that only taking his B/P when he feels like it is running high could lead to bad health issues and  health emergencies, encouraged patient to take his blood pressure at least weekly.      Plan: RN Health Coach will send PCP a Barrier Letter and today's assessment note, will send patient education material regarding hypertension and weight loss, will send patient Advanced Directive documents. RN Health Coach will call patient within the month of June and patient agrees to future outreach calls.   Emelia Loron RN, BSN [place] [phone] [email or web address]

## 2020-01-21 DIAGNOSIS — I10 Essential (primary) hypertension: Secondary | ICD-10-CM | POA: Diagnosis not present

## 2020-01-21 DIAGNOSIS — E785 Hyperlipidemia, unspecified: Secondary | ICD-10-CM | POA: Diagnosis not present

## 2020-01-21 DIAGNOSIS — E1165 Type 2 diabetes mellitus with hyperglycemia: Secondary | ICD-10-CM | POA: Diagnosis not present

## 2020-01-21 DIAGNOSIS — K219 Gastro-esophageal reflux disease without esophagitis: Secondary | ICD-10-CM | POA: Diagnosis not present

## 2020-01-21 DIAGNOSIS — M159 Polyosteoarthritis, unspecified: Secondary | ICD-10-CM | POA: Diagnosis not present

## 2020-01-21 DIAGNOSIS — R945 Abnormal results of liver function studies: Secondary | ICD-10-CM | POA: Diagnosis not present

## 2020-01-21 DIAGNOSIS — N183 Chronic kidney disease, stage 3 unspecified: Secondary | ICD-10-CM | POA: Diagnosis not present

## 2020-01-21 DIAGNOSIS — E875 Hyperkalemia: Secondary | ICD-10-CM | POA: Diagnosis not present

## 2020-01-23 DIAGNOSIS — K219 Gastro-esophageal reflux disease without esophagitis: Secondary | ICD-10-CM | POA: Diagnosis not present

## 2020-01-23 DIAGNOSIS — Z6828 Body mass index (BMI) 28.0-28.9, adult: Secondary | ICD-10-CM | POA: Diagnosis not present

## 2020-01-23 DIAGNOSIS — E785 Hyperlipidemia, unspecified: Secondary | ICD-10-CM | POA: Diagnosis not present

## 2020-01-23 DIAGNOSIS — E119 Type 2 diabetes mellitus without complications: Secondary | ICD-10-CM | POA: Diagnosis not present

## 2020-01-23 DIAGNOSIS — Z0000 Encounter for general adult medical examination without abnormal findings: Secondary | ICD-10-CM | POA: Diagnosis not present

## 2020-01-23 DIAGNOSIS — I10 Essential (primary) hypertension: Secondary | ICD-10-CM | POA: Diagnosis not present

## 2020-01-23 DIAGNOSIS — E875 Hyperkalemia: Secondary | ICD-10-CM | POA: Diagnosis not present

## 2020-02-04 ENCOUNTER — Other Ambulatory Visit: Payer: Self-pay

## 2020-02-04 ENCOUNTER — Ambulatory Visit (HOSPITAL_COMMUNITY)
Admission: RE | Admit: 2020-02-04 | Discharge: 2020-02-04 | Disposition: A | Discharge status: Home / Self Care | Payer: Medicare HMO | Source: Ambulatory Visit | Attending: Urology | Admitting: Urology

## 2020-02-04 DIAGNOSIS — N200 Calculus of kidney: Secondary | ICD-10-CM | POA: Diagnosis not present

## 2020-02-06 ENCOUNTER — Ambulatory Visit (INDEPENDENT_AMBULATORY_CARE_PROVIDER_SITE_OTHER): Payer: Medicare HMO | Admitting: Urology

## 2020-02-06 ENCOUNTER — Other Ambulatory Visit: Payer: Self-pay

## 2020-02-06 ENCOUNTER — Encounter: Payer: Self-pay | Admitting: Urology

## 2020-02-06 VITALS — BP 162/88 | HR 75 | Temp 98.1°F | Ht 70.0 in | Wt 198.0 lb

## 2020-02-06 DIAGNOSIS — N200 Calculus of kidney: Secondary | ICD-10-CM

## 2020-02-06 NOTE — Patient Instructions (Signed)

## 2020-02-06 NOTE — Progress Notes (Signed)
Urological Symptom Review  Patient is experiencing the following symptoms: Kidney stones   Review of Systems  Gastrointestinal (upper)  : Negative for upper GI symptoms  Gastrointestinal (lower) : Negative for lower GI symptoms  Constitutional : Negative for symptoms  Skin: Negative for skin symptoms  Eyes: Negative for eye symptoms  Ear/Nose/Throat : Negative for Ear/Nose/Throat symptoms  Hematologic/Lymphatic: None  Cardiovascular : Negative for cardiovascular symptoms  Respiratory : Negative for respiratory symptoms  Endocrine: Negative for endocrine symptoms  Musculoskeletal: Negative for musculoskeletal symptoms  Neurological: Negative for neurological symptoms  Psychologic: Negative for psychiatric symptoms

## 2020-02-06 NOTE — Progress Notes (Signed)
02/06/2020 9:43 AM   Lupita Leash 17-Mar-1946 [id number]  Referring provider: [place], [place], [place],  [place] [address]  Nephrolithiasis  HPI: Mr Ganser is a 74yo here for followup for nephrolithiasis. No stone events since last. He drinks 80oz of crystal light daily. NO flank pain. Renal US shows stable bilateral nephrolithiasis. He took sodium bicarb and after 3-4 days he had a significantly elevated BP and he stopped the medication.    PMH: Past Medical History:  Diagnosis Date  . Arthritis    fingers  . CAD (coronary artery disease)    CABG 2010 - LIMA to LAD, SVG to diagonal, SVG to PDA and PLV  . Essential hypertension   . GERD (gastroesophageal reflux disease)   . History of iron deficiency anemia   . History of kidney stones   . History of pneumonia   . Hyperlipidemia   . NSTEMI (non-ST elevated myocardial infarction) ([place]) 2010  . Type 2 diabetes mellitus [facility])     Surgical History: Past Surgical History:  Procedure Laterality Date  . CORONARY ARTERY BYPASS GRAFT  2010   QUADRUPLE BYPASS AFTER HEART ATTACK   . CYSTOSCOPY W/ URETERAL STENT PLACEMENT Right 05/16/2019   Procedure: CYSTOSCOPY WITH RETROGRADE PYELOGRAM/URETERAL STENT PLACEMENT;  Surgeon: Bjorn Loser, MD;  Location: [facility];  Service: Urology;  Laterality: Right;  . CYSTOSCOPY W/ URETERAL STENT REMOVAL Right 06/04/2019   Procedure: CYSTOSCOPY WITH STENT REMOVAL;  Surgeon: Franchot Gallo, MD;  Location: [facility];  Service: Urology;  Laterality: Right;  . CYSTOSCOPY WITH HOLMIUM LASER LITHOTRIPSY Right 06/04/2019   Procedure: CYSTOSCOPY, URETEROSCOPY, WITH HOLMIUM LASER, STENT PLACEMENT;  Surgeon: Franchot Gallo, MD;  Location: [facility];  Service: Urology;  Laterality: Right;  . CYSTOSCOPY/URETEROSCOPY/HOLMIUM LASER/STENT PLACEMENT Left 06/04/2019   Procedure: CYSTOSCOPY, URETEROSCOPY, RETROGRADE PYELOGRAM, STENT PLACEMENT;  Surgeon: Franchot Gallo, MD;  Location: [facility];   Service: Urology;  Laterality: Left;  . PILONIDAL CYST EXCISION  1960    Home Medications:  Allergies as of 02/06/2020   No Known Allergies     Medication List       Accurate as of Feb 06, 2020  9:43 AM. If you have any questions, ask your nurse or doctor.        Aspercreme w/Lidocaine 4 % Crea Generic drug: Lidocaine HCl Apply 1 application topically daily as needed (pain).   aspirin EC 81 MG tablet Take 81 mg by mouth daily.   [place] Dream Arthritis 0.025 % Crea Generic drug: Histamine Dihydrochloride Apply 1 application topically daily as needed (pain).   hydrochlorothiazide 12.5 MG tablet Commonly known as: HYDRODIURIL Take 1 tablet (12.5 mg total) by mouth daily.   lisinopril 20 MG tablet Commonly known as: ZESTRIL Take by mouth.   lisinopril 20 MG tablet Commonly known as: ZESTRIL Take 1 tablet (20 mg total) by mouth 2 (two) times daily.   loratadine 10 MG tablet Commonly known as: CLARITIN Take 10 mg by mouth daily.   metFORMIN 500 MG 24 hr tablet Commonly known as: GLUCOPHAGE-XR Take 1,000 mg by mouth 2 (two) times daily with a meal.   metFORMIN 500 MG tablet Commonly known as: GLUCOPHAGE Take by mouth.   metoprolol succinate 25 MG 24 hr tablet Commonly known as: Toprol XL Take 0.5 tablets (12.5 mg total) by mouth daily.   metoprolol tartrate 50 MG tablet Commonly known as: LOPRESSOR Take 25 mg by mouth 2 (two) times daily.   nitroGLYCERIN 0.4 MG SL tablet Commonly known  as: NITROSTAT Place 1 tablet (0.4 mg total) under the tongue every 5 (five) minutes as needed for chest pain.   pravastatin 40 MG tablet Commonly known as: PRAVACHOL Take 40 mg by mouth daily.   sodium bicarbonate 650 MG tablet Take 1 tablet (650 mg total) by mouth 2 (two) times daily.   tamsulosin 0.4 MG Caps capsule Commonly known as: FLOMAX Take by mouth.   traMADol 50 MG tablet Commonly known as: ULTRAM Take by mouth.   traMADol 50 MG tablet Commonly known  as: ULTRAM Take 50 mg by mouth 2 (two) times daily.       Allergies: No Known Allergies  Family History: Family History  Problem Relation Age of Onset  . Thyroid disease Mother   . Heart attack Father     Social History:  reports that he quit smoking about 11 years ago. His smoking use included cigarettes. He has a 36.00 pack-year smoking history. He has never used smokeless tobacco. He reports current alcohol use. He reports that he does not use drugs.  ROS: All other review of systems were reviewed and are negative except what is noted above in HPI  Physical Exam: BP (!) 162/88   Pulse 75   Temp 98.1 F (36.7 C)   Ht 5\' 10"  (1.778 m)   Wt 198 lb (89.8 kg)   BMI 28.41 kg/m   Constitutional:  Alert and oriented, No acute distress. HEENT: [place] AT, moist mucus membranes.  Trachea midline, no masses. Cardiovascular: No clubbing, cyanosis, or edema. Respiratory: Normal respiratory effort, no increased work of breathing. GI: Abdomen is soft, nontender, nondistended, no abdominal masses GU: No CVA tenderness.  Lymph: No cervical or inguinal lymphadenopathy. Skin: No rashes, bruises or suspicious lesions. Neurologic: Grossly intact, no focal deficits, moving all 4 extremities. Psychiatric: Normal mood and affect.  Laboratory Data: Lab Results  Component Value Date   WBC 11.2 (H) 06/01/2019   HGB 12.5 (L) 06/01/2019   HCT 39.2 06/01/2019   MCV 100.0 06/01/2019   PLT 286 06/01/2019    Lab Results  Component Value Date   CREATININE 2.15 (H) 06/01/2019    No results found for: PSA  No results found for: TESTOSTERONE  Lab Results  Component Value Date   HGBA1C 6.6 (H) 05/16/2019    Urinalysis    Component Value Date/Time   COLORURINE YELLOW 05/17/2019 1041   APPEARANCEUR CLEAR 05/17/2019 1041   LABSPEC 1.014 05/17/2019 1041   PHURINE 6.0 05/17/2019 1041   GLUCOSEU 50 (A) 05/17/2019 1041   HGBUR LARGE (A) 05/17/2019 1041   BILIRUBINUR neg 11/07/2019 [place] 05/17/2019 1041   PROTEINUR Negative 11/07/2019 0954   PROTEINUR 30 (A) 05/17/2019 1041   UROBILINOGEN 0.2 11/07/2019 0954   NITRITE neg 11/07/2019 0954   NITRITE NEGATIVE 05/17/2019 1041   LEUKOCYTESUR Small (1+) (A) 11/07/2019 0954   LEUKOCYTESUR MODERATE (A) 05/17/2019 1041    Lab Results  Component Value Date   BACTERIA NONE SEEN 05/17/2019    Pertinent Imaging: Renal US 02/04/2020: Images reviewed and discussed with the patient No results found for this or any previous visit. No results found for this or any previous visit. No results found for this or any previous visit. No results found for this or any previous visit. Results for orders placed during the hospital encounter of 02/04/20  Ultrasound renal complete   Narrative CLINICAL DATA:  Nephrolithiasis follow-up  EXAM: RENAL / URINARY TRACT ULTRASOUND COMPLETE  COMPARISON:  11/02/2019 CT  examination  FINDINGS: Right Kidney:  Renal measurements: 12.6 x 5.4 x 6.1 cm. = volume: 218 mL. No obstructive changes or focal masses are seen. Multiple calculi are noted. The largest of these measures up to 13 mm.  Left Kidney:  Renal measurements: 12.5 x 6.4 x 4.3 cm. = volume: 180 mL. Hypoechoic areas are noted in the upper pole similar to that seen on prior CT examination. This may represent infundibular stenosis with upper pole dilatation or possibly parapelvic cysts. Multiple echogenic foci are noted consistent with calculi. Largest of these measures 17 mm in the lower pole.  Bladder:  Decompressed  Other:  None.  IMPRESSION: Bilateral renal calculi.  Hypoechoic areas in the upper pole of the left kidney similar to that seen on recent CT examination. This is new from prior ultrasound 08/02/2019 and may represent infundibular stenosis/stone obstruction in the upper pole.   Electronically Signed   By: Inez Catalina M.D.   On: 02/04/2020 11:53    No results found for this or any  previous visit. No results found for this or any previous visit. Results for orders placed during the hospital encounter of 11/02/19  CT RENAL STONE STUDY   Narrative CLINICAL DATA:  Right-sided flank pain. Low back pain for 3 weeks.  EXAM: CT ABDOMEN AND PELVIS WITHOUT CONTRAST  TECHNIQUE: Multidetector CT imaging of the abdomen and pelvis was performed following the standard protocol without IV contrast.  COMPARISON:  Are multiple renal ultrasound 08/02/2019 and CT abdomen 10/11/2019  FINDINGS: Lower chest: Stable scarring in the right middle lobe. Chronic sub solid density posteromedially in both lower lobes, left greater than right, likely due to minimal atelectasis/scar. Coronary atherosclerosis.  Hepatobiliary: Unremarkable  Pancreas: Unremarkable  Spleen: Unremarkable  Adrenals/Urinary Tract: Both adrenal glands appear normal.  Six nonobstructive right renal calculi are present measuring up to 1.0 cm in long axis (image 51/5)  Four left renal calculi are present measuring up to 1.4 cm in long axis (image 57/5). The dominant 1.4 cm calculus is in the vicinity of a left kidney upper pole infundibulum, and there appears to be downstream caliceal blunting/dilatation suggesting blockage of this infundibulum, a similar appearance to 1/20 09/1998 21  The previous right UPJ calculus shown on the 10/11/2019 exam is no longer seen.  Stomach/Bowel: Wall thickening is present diffusely in the rectum although may be from nondistention. Descending and sigmoid colon diverticulosis noted with scattered diverticula elsewhere in the colon, but no findings of active diverticulitis. Normal appendix.  Vascular/Lymphatic: Aortoiliac atherosclerotic vascular disease. No pathologic adenopathy.  Reproductive: Unremarkable  Other: No supplemental non-categorized findings.  Musculoskeletal: Prior median sternotomy. Bridging spurring of both sacroiliac joints. Lumbar spondylosis and  degenerative disc disease causing multilevel impingement. Transitional S1 vertebra.  IMPRESSION: 1. Bilateral nonobstructive nephrolithiasis. The dominant 1.4 cm calculus is in a left kidney upper pole infundibulum, and there is downstream caliceal blunting/dilatation in the left kidney upper pole suggesting blockage of this infundibulum. 2. Other imaging findings of potential clinical significance: Descending and sigmoid colon diverticulosis. Aortoiliac atherosclerotic vascular disease. Lumbar spondylosis and degenerative disc disease causing multilevel impingement. Bridging spurring of both sacroiliac joints. Transitional S1 vertebra.   Electronically Signed   By: Van Clines M.D.   On: 11/02/2019 12:27     Assessment & Plan:    1. Nephrolithiasis -Continue crystal light lemonade -RTC 6 months with renal US   No follow-ups on file.  Nicolette Bang, MD  [facility] [place]

## 2020-02-07 DIAGNOSIS — E875 Hyperkalemia: Secondary | ICD-10-CM | POA: Diagnosis not present

## 2020-02-09 DIAGNOSIS — N400 Enlarged prostate without lower urinary tract symptoms: Secondary | ICD-10-CM | POA: Diagnosis not present

## 2020-02-09 DIAGNOSIS — I25119 Atherosclerotic heart disease of native coronary artery with unspecified angina pectoris: Secondary | ICD-10-CM | POA: Diagnosis not present

## 2020-02-09 DIAGNOSIS — I252 Old myocardial infarction: Secondary | ICD-10-CM | POA: Diagnosis not present

## 2020-02-09 DIAGNOSIS — I10 Essential (primary) hypertension: Secondary | ICD-10-CM | POA: Diagnosis not present

## 2020-02-09 DIAGNOSIS — Z008 Encounter for other general examination: Secondary | ICD-10-CM | POA: Diagnosis not present

## 2020-02-09 DIAGNOSIS — E669 Obesity, unspecified: Secondary | ICD-10-CM | POA: Diagnosis not present

## 2020-02-09 DIAGNOSIS — E1159 Type 2 diabetes mellitus with other circulatory complications: Secondary | ICD-10-CM | POA: Diagnosis not present

## 2020-02-09 DIAGNOSIS — K219 Gastro-esophageal reflux disease without esophagitis: Secondary | ICD-10-CM | POA: Diagnosis not present

## 2020-02-09 DIAGNOSIS — M1990 Unspecified osteoarthritis, unspecified site: Secondary | ICD-10-CM | POA: Diagnosis not present

## 2020-02-09 DIAGNOSIS — E785 Hyperlipidemia, unspecified: Secondary | ICD-10-CM | POA: Diagnosis not present

## 2020-02-09 DIAGNOSIS — G8929 Other chronic pain: Secondary | ICD-10-CM | POA: Diagnosis not present

## 2020-03-07 ENCOUNTER — Ambulatory Visit: Payer: Medicare HMO | Admitting: *Deleted

## 2020-03-07 ENCOUNTER — Other Ambulatory Visit: Payer: Self-pay | Admitting: *Deleted

## 2020-03-07 NOTE — Patient Outreach (Signed)
[place] [facility]) Care Management  [place]  03/07/2020   Derek [name] [birth year] [id number]  Subjective: Successful telephone outreach call to patient. HIPAA identifiers obtained. Patient states he is doing well. He reports that he has been taking his B/P twice daily and recording the values since his last PCP visit on 01/23/20.Patient states his PCP stopped his lisinopril medication due to a high potasium level and replaced it with amlodipine of which patient feels he is tolerating without difficulty. Per patient he is scheduled to see PCP to follow-up within in a couple of weeks. Patient's B/P ranges are 107/43-162-59. Nurse praised patient for starting to monitoring his B/P routinely. Patient did not have any other concerns at this time.    Encounter Medications:  Outpatient Encounter Medications as of 03/07/2020  Medication Sig Note  . amLODipine (NORVASC) 2.5 MG tablet Take 2.5 mg by mouth daily.   Marland Kitchen aspirin EC 81 MG tablet Take 81 mg by mouth daily.   Marland Kitchen Histamine Dihydrochloride (AUSTRALIAN DREAM ARTHRITIS) 0.025 % CREA Apply 1 application topically daily as needed (pain).   . hydrochlorothiazide (HYDRODIURIL) 12.5 MG tablet Take 1 tablet (12.5 mg total) by mouth daily.   . Lidocaine HCl (ASPERCREME W/LIDOCAINE) 4 % CREA Apply 1 application topically daily as needed (pain).   Marland Kitchen loratadine (CLARITIN) 10 MG tablet Take 10 mg by mouth daily.   Marland Kitchen losartan (COZAAR) 25 MG tablet Take 25 mg by mouth daily.   . metFORMIN (GLUCOPHAGE-XR) 500 MG 24 hr tablet Take 1,000 mg by mouth 2 (two) times daily with a meal.   . metoprolol succinate (TOPROL XL) 25 MG 24 hr tablet Take 0.5 tablets (12.5 mg total) by mouth daily. 01/18/2020: Patient takes 50 mg daily  . metoprolol tartrate (LOPRESSOR) 50 MG tablet Take 25 mg by mouth 2 (two) times daily.   . pravastatin (PRAVACHOL) 40 MG tablet Take 40 mg by mouth daily.   . tamsulosin (FLOMAX) 0.4 MG CAPS capsule Take by mouth.   .  traMADol (ULTRAM) 50 MG tablet Take 50 mg by mouth 2 (two) times daily.    Marland Kitchen lisinopril (ZESTRIL) 20 MG tablet Take 1 tablet (20 mg total) by mouth 2 (two) times daily. (Patient not taking: Reported on 02/06/2020) 03/07/2020: PCP changed medication  . lisinopril (ZESTRIL) 20 MG tablet Take by mouth. (Patient not taking: Reported on 03/07/2020) 03/07/2020: PCP changed medication  . metFORMIN (GLUCOPHAGE) 500 MG tablet Take by mouth. (Patient not taking: Reported on 6/81/2751) 7/00/1749: duplicate  . nitroGLYCERIN (NITROSTAT) 0.4 MG SL tablet Place 1 tablet (0.4 mg total) under the tongue every 5 (five) minutes as needed for chest pain.   . sodium bicarbonate 650 MG tablet Take 1 tablet (650 mg total) by mouth 2 (two) times daily. (Patient not taking: Reported on 02/06/2020) 01/18/2020: Per patient, stopped due to B/P elevation  . traMADol (ULTRAM) 50 MG tablet Take by mouth. (Patient not taking: Reported on 4/49/6759) 1/63/8466: duplicate   No facility-administered encounter medications on file as of 03/07/2020.    Functional Status:  In your present state of health, do you have any difficulty performing the following activities: 01/18/2020 06/01/2019  Hearing? N N  Vision? N N  Difficulty concentrating or making decisions? N N  Walking or climbing stairs? N N  Dressing or bathing? N N  Doing errands, shopping? N N  Preparing Food and eating ? N -  Using the Toilet? N -  In the past six months, have you accidently  leaked urine? N -  Do you have problems with loss of bowel control? N -  Managing your Medications? N -  Managing your Finances? N -  Housekeeping or managing your Housekeeping? N -  Some recent data might be hidden    Fall/Depression Screening: Fall Risk  01/18/2020  Falls in the past year? 1  Comment due to an accident while working  Number falls in past yr: 0  Injury with Fall? 0  Risk for fall due to : History of fall(s)  Follow up Falls prevention discussed;Education  provided;Falls evaluation completed   PHQ 2/9 Scores 01/18/2020 06/01/2019  PHQ - 2 Score 0 0   THN CM Care Plan Problem One     Most Recent Value  Care Plan Problem One Knowledge deficient related to hypertension condition, treatment plan, and lifestyle changes  Role Documenting the Problem One [place] for Problem One Active  THN Long Term Goal  Patient will verbalize maintaining B/P below 160/90 within the next 90 days  THN Long Term Goal Start Date 03/07/20  [facility] Long Term Goal Met Date 03/07/20  Interventions for Problem One Long Term Goal RN encouraged patient to continue to take his B/P daily and record the data, encouraged medication and low sodium diet adherence, discussed continuing to stay as physically active as tolerated.  THN CM Short Term Goal #1 Met Date 03/07/20       Plan: RN Health Coach will call patient within the month of August and patient agrees to future outreach calls.   Emelia Loron RN, BSN [place] [phone] [email or web address]

## 2020-03-11 DIAGNOSIS — N183 Chronic kidney disease, stage 3 unspecified: Secondary | ICD-10-CM | POA: Diagnosis not present

## 2020-03-11 DIAGNOSIS — E875 Hyperkalemia: Secondary | ICD-10-CM | POA: Diagnosis not present

## 2020-03-11 DIAGNOSIS — I10 Essential (primary) hypertension: Secondary | ICD-10-CM | POA: Diagnosis not present

## 2020-03-11 DIAGNOSIS — E1165 Type 2 diabetes mellitus with hyperglycemia: Secondary | ICD-10-CM | POA: Diagnosis not present

## 2020-03-24 DIAGNOSIS — E875 Hyperkalemia: Secondary | ICD-10-CM | POA: Diagnosis not present

## 2020-04-28 ENCOUNTER — Other Ambulatory Visit: Payer: Self-pay | Admitting: *Deleted

## 2020-04-28 NOTE — Patient Outreach (Signed)
[facility]) Care Management  04/28/2020  Derek [name] 27-Dec-1945 [id number]  Unsuccessful outreach attempt made to patient. Patient answered the phone and explained that he was getting ready to get his haircut and could not speak at this time.   Plan: RN Health Coach will call patient within the month of August and patient agrees to future outreach calls.   Emelia Loron RN, BSN [place] [phone] [name]

## 2020-05-19 ENCOUNTER — Other Ambulatory Visit: Payer: Self-pay | Admitting: *Deleted

## 2020-05-19 NOTE — Patient Outreach (Signed)
[place] [facility]) Care Management  05/19/2020  KENAI FLUEGEL 21-Apr-1946 [id number]  Unsuccessful outreach attempt made to patient. RN Health Coach left HIPAA compliant voicemail message along with her contact information.  Plan: RN Health Coach will call patient within the month of September.  Emelia Loron RN, BSN [place] [phone] [email or web address]

## 2020-05-28 DIAGNOSIS — R69 Illness, unspecified: Secondary | ICD-10-CM | POA: Diagnosis not present

## 2020-06-05 DIAGNOSIS — I129 Hypertensive chronic kidney disease with stage 1 through stage 4 chronic kidney disease, or unspecified chronic kidney disease: Secondary | ICD-10-CM | POA: Diagnosis not present

## 2020-06-05 DIAGNOSIS — N029 Recurrent and persistent hematuria with unspecified morphologic changes: Secondary | ICD-10-CM | POA: Diagnosis not present

## 2020-06-05 DIAGNOSIS — E875 Hyperkalemia: Secondary | ICD-10-CM | POA: Diagnosis not present

## 2020-06-05 DIAGNOSIS — N200 Calculus of kidney: Secondary | ICD-10-CM | POA: Diagnosis not present

## 2020-06-05 DIAGNOSIS — R809 Proteinuria, unspecified: Secondary | ICD-10-CM | POA: Diagnosis not present

## 2020-06-05 DIAGNOSIS — E1129 Type 2 diabetes mellitus with other diabetic kidney complication: Secondary | ICD-10-CM | POA: Diagnosis not present

## 2020-06-05 DIAGNOSIS — E1122 Type 2 diabetes mellitus with diabetic chronic kidney disease: Secondary | ICD-10-CM | POA: Diagnosis not present

## 2020-06-05 DIAGNOSIS — Z79899 Other long term (current) drug therapy: Secondary | ICD-10-CM | POA: Diagnosis not present

## 2020-06-05 DIAGNOSIS — N189 Chronic kidney disease, unspecified: Secondary | ICD-10-CM | POA: Diagnosis not present

## 2020-06-10 DIAGNOSIS — E1122 Type 2 diabetes mellitus with diabetic chronic kidney disease: Secondary | ICD-10-CM | POA: Diagnosis not present

## 2020-06-10 DIAGNOSIS — R809 Proteinuria, unspecified: Secondary | ICD-10-CM | POA: Diagnosis not present

## 2020-06-10 DIAGNOSIS — E559 Vitamin D deficiency, unspecified: Secondary | ICD-10-CM | POA: Diagnosis not present

## 2020-06-10 DIAGNOSIS — E1129 Type 2 diabetes mellitus with other diabetic kidney complication: Secondary | ICD-10-CM | POA: Diagnosis not present

## 2020-06-10 DIAGNOSIS — N029 Recurrent and persistent hematuria with unspecified morphologic changes: Secondary | ICD-10-CM | POA: Diagnosis not present

## 2020-06-10 DIAGNOSIS — N200 Calculus of kidney: Secondary | ICD-10-CM | POA: Diagnosis not present

## 2020-06-10 DIAGNOSIS — N189 Chronic kidney disease, unspecified: Secondary | ICD-10-CM | POA: Diagnosis not present

## 2020-06-10 DIAGNOSIS — I129 Hypertensive chronic kidney disease with stage 1 through stage 4 chronic kidney disease, or unspecified chronic kidney disease: Secondary | ICD-10-CM | POA: Diagnosis not present

## 2020-06-10 DIAGNOSIS — E875 Hyperkalemia: Secondary | ICD-10-CM | POA: Diagnosis not present

## 2020-06-10 DIAGNOSIS — Z79899 Other long term (current) drug therapy: Secondary | ICD-10-CM | POA: Diagnosis not present

## 2020-06-16 ENCOUNTER — Other Ambulatory Visit: Payer: Self-pay | Admitting: *Deleted

## 2020-06-16 DIAGNOSIS — N200 Calculus of kidney: Secondary | ICD-10-CM | POA: Diagnosis not present

## 2020-06-16 NOTE — Patient Outreach (Signed)
[facility]) Care Management  [place]  06/16/2020   Derek [name] 1946/06/08 [id number]  Subjective: Successful telephone outreach call to patient. HIPAA identifiers obtained. Patient states that he feels good. He explained that his PCP referred him to a kidney doctor and he was told he has CKD. Patient explained that he was given a list of foods to avoid and that he has begun to not eat what he knows will be bad for his kidneys. Per patient he has an upcoming follow-up visit with the nephrologist in a few weeks. Nurse verbally provided the patient with renal disease education and patient states that the nephrologist did provide him with education; states he does not need written kidney disease education at this time.   Patient states he is now taking his B/P weekly. Reporting that he took his B/P daily for about 2 month and he got around the same value that was in a good parameter. Patient states his B/P range has been 505-397 systolic to 67-34 diastolic. 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 He does for the most part limit his sodium intake as well as now currently eating a renal friendly diet. Patient did not have any further questions or concerns and did confirm that he had this nurse's contact number to call if needed.   Encounter Medications:  Outpatient Encounter Medications as of 06/16/2020  Medication Sig Note  . amLODipine (NORVASC) 2.5 MG tablet Take 2.5 mg by mouth daily.   Marland Kitchen aspirin EC 81 MG tablet Take 81 mg by mouth daily.   Marland Kitchen Histamine Dihydrochloride (AUSTRALIAN DREAM ARTHRITIS) 0.025 % CREA Apply 1 application topically daily as needed (pain).   . hydrochlorothiazide (HYDRODIURIL) 12.5 MG tablet Take 1 tablet (12.5 mg total) by mouth daily.   . Lidocaine HCl (ASPERCREME W/LIDOCAINE) 4 % CREA Apply 1 application topically daily as needed (pain).   Marland Kitchen lisinopril (ZESTRIL) 20 MG tablet Take 1 tablet (20 mg  total) by mouth 2 (two) times daily. (Patient not taking: Reported on 02/06/2020) 03/07/2020: PCP changed medication  . lisinopril (ZESTRIL) 20 MG tablet Take by mouth. (Patient not taking: Reported on 03/07/2020) 03/07/2020: PCP changed medication  . loratadine (CLARITIN) 10 MG tablet Take 10 mg by mouth daily.   Marland Kitchen losartan (COZAAR) 25 MG tablet Take 25 mg by mouth daily.   . metFORMIN (GLUCOPHAGE) 500 MG tablet Take by mouth. (Patient not taking: Reported on 1/93/7902) 12/28/7351: duplicate  . metFORMIN (GLUCOPHAGE-XR) 500 MG 24 hr tablet Take 1,000 mg by mouth 2 (two) times daily with a meal.   . metoprolol succinate (TOPROL XL) 25 MG 24 hr tablet Take 0.5 tablets (12.5 mg total) by mouth daily. 01/18/2020: Patient takes 50 mg daily  . metoprolol tartrate (LOPRESSOR) 50 MG tablet Take 25 mg by mouth 2 (two) times daily.   . nitroGLYCERIN (NITROSTAT) 0.4 MG SL tablet Place 1 tablet (0.4 mg total) under the tongue every 5 (five) minutes as needed for chest pain.   . pravastatin (PRAVACHOL) 40 MG tablet Take 40 mg by mouth daily.   . sodium bicarbonate 650 MG tablet Take 1 tablet (650 mg total) by mouth 2 (two) times daily. (Patient not taking: Reported on 02/06/2020) 01/18/2020: Per patient, stopped due to B/P elevation  . tamsulosin (FLOMAX) 0.4 MG CAPS capsule Take by mouth.   . traMADol (ULTRAM) 50 MG tablet Take 50 mg by mouth 2 (two) times daily.    Marland Kitchen  traMADol (ULTRAM) 50 MG tablet Take by mouth. (Patient not taking: Reported on 0/98/1191) 4/78/2956: duplicate   No facility-administered encounter medications on file as of 06/16/2020.    Functional Status:  In your present state of health, do you have any difficulty performing the following activities: 01/18/2020  Hearing? N  Vision? N  Difficulty concentrating or making decisions? N  Walking or climbing stairs? N  Dressing or bathing? N  Doing errands, shopping? N  Preparing Food and eating ? N  Using the Toilet? N  In the past six months,  have you accidently leaked urine? N  Do you have problems with loss of bowel control? N  Managing your Medications? N  Managing your Finances? N  Housekeeping or managing your Housekeeping? N  Some recent data might be hidden    Fall/Depression Screening: Fall Risk  06/16/2020 01/18/2020  Falls in the past year? 1 1  Comment - due to an accident while working  Number falls in past yr: 0 0  Injury with Fall? 0 0  Risk for fall due to : - History of fall(s)  Follow up Falls prevention discussed;Education provided;Falls evaluation completed Falls prevention discussed;Education provided;Falls evaluation completed   PHQ 2/9 Scores 01/18/2020 06/01/2019  PHQ - 2 Score 0 0   Goals Addressed            This Visit's Progress   . Patient will monitor his B/P weekly for the next 90 days       [place] (see longitudinal plan of care for additional care plan information)  Objective:  . Last practice recorded BP readings:  BP Readings from Last 3 Encounters:  02/06/20 (!) 162/88  11/07/19 135/62  07/16/19 (!) 160/72 .   Marland Kitchen Most recent eGFR/CrCl: No results found for: EGFR  No components found for: CRCL  Current Barriers:  Marland Kitchen Knowledge deficit related to self care management of hypertension  Case Manager Clinical Goal(s):  Marland Kitchen Over the next 90 days, patient will verbalize understanding of plan for hypertension management . Over the next 90 days, patient will attend all scheduled medical appointments: 0 . Over the next 90 days, patient will demonstrate improved adherence to prescribed treatment plan for hypertension as evidenced by taking all medications as prescribed, monitoring and recording blood pressure as directed, adhering to low sodium/DASH diet  Interventions:  . Evaluation of current treatment plan related to hypertension self management and patient's adherence to plan as established by provider. . Reviewed medications with patient and discussed importance of  compliance . Provided education regarding s/s of DASH diet/Low salt diet . Provided education regarding complications of uncontrolled blood pressure . Encouraged patient to monitor his B/P daily. (patient explained he did for 2 months and got around the same within parameter reading and now is currently taking B/P weekly) . Encouraged patient to stay physically active  Patient Self Care Activities:  . Self administers medications as prescribed . Attends all scheduled provider appointments . Increase physical activity as tolerated . Patient states he takes his B/P weekly   Initial goal documentation        Plan: RN Health Coach will send PCP today's assessment note, will call patient within the month of December, and patient agrees to future outreach calls.   Emelia Loron RN, BSN [place] [phone] [name]

## 2020-06-28 DIAGNOSIS — N200 Calculus of kidney: Secondary | ICD-10-CM | POA: Diagnosis not present

## 2020-07-17 DIAGNOSIS — E1129 Type 2 diabetes mellitus with other diabetic kidney complication: Secondary | ICD-10-CM | POA: Diagnosis not present

## 2020-07-17 DIAGNOSIS — I129 Hypertensive chronic kidney disease with stage 1 through stage 4 chronic kidney disease, or unspecified chronic kidney disease: Secondary | ICD-10-CM | POA: Diagnosis not present

## 2020-07-17 DIAGNOSIS — R768 Other specified abnormal immunological findings in serum: Secondary | ICD-10-CM | POA: Diagnosis not present

## 2020-07-17 DIAGNOSIS — D472 Monoclonal gammopathy: Secondary | ICD-10-CM | POA: Diagnosis not present

## 2020-07-17 DIAGNOSIS — R809 Proteinuria, unspecified: Secondary | ICD-10-CM | POA: Diagnosis not present

## 2020-07-17 DIAGNOSIS — N200 Calculus of kidney: Secondary | ICD-10-CM | POA: Diagnosis not present

## 2020-07-17 DIAGNOSIS — E1122 Type 2 diabetes mellitus with diabetic chronic kidney disease: Secondary | ICD-10-CM | POA: Diagnosis not present

## 2020-07-17 DIAGNOSIS — N189 Chronic kidney disease, unspecified: Secondary | ICD-10-CM | POA: Diagnosis not present

## 2020-07-17 DIAGNOSIS — R82991 Hypocitraturia: Secondary | ICD-10-CM | POA: Diagnosis not present

## 2020-07-28 DIAGNOSIS — I10 Essential (primary) hypertension: Secondary | ICD-10-CM | POA: Diagnosis not present

## 2020-07-28 DIAGNOSIS — R945 Abnormal results of liver function studies: Secondary | ICD-10-CM | POA: Diagnosis not present

## 2020-07-28 DIAGNOSIS — E1165 Type 2 diabetes mellitus with hyperglycemia: Secondary | ICD-10-CM | POA: Diagnosis not present

## 2020-07-28 DIAGNOSIS — Z6829 Body mass index (BMI) 29.0-29.9, adult: Secondary | ICD-10-CM | POA: Diagnosis not present

## 2020-07-28 DIAGNOSIS — K219 Gastro-esophageal reflux disease without esophagitis: Secondary | ICD-10-CM | POA: Diagnosis not present

## 2020-07-28 DIAGNOSIS — R5383 Other fatigue: Secondary | ICD-10-CM | POA: Diagnosis not present

## 2020-07-28 DIAGNOSIS — N183 Chronic kidney disease, stage 3 unspecified: Secondary | ICD-10-CM | POA: Diagnosis not present

## 2020-07-28 DIAGNOSIS — E785 Hyperlipidemia, unspecified: Secondary | ICD-10-CM | POA: Diagnosis not present

## 2020-07-28 DIAGNOSIS — E559 Vitamin D deficiency, unspecified: Secondary | ICD-10-CM | POA: Diagnosis not present

## 2020-07-30 DIAGNOSIS — E1165 Type 2 diabetes mellitus with hyperglycemia: Secondary | ICD-10-CM | POA: Diagnosis not present

## 2020-07-30 DIAGNOSIS — N183 Chronic kidney disease, stage 3 unspecified: Secondary | ICD-10-CM | POA: Diagnosis not present

## 2020-07-30 DIAGNOSIS — E875 Hyperkalemia: Secondary | ICD-10-CM | POA: Diagnosis not present

## 2020-07-30 DIAGNOSIS — N200 Calculus of kidney: Secondary | ICD-10-CM | POA: Diagnosis not present

## 2020-07-30 DIAGNOSIS — D472 Monoclonal gammopathy: Secondary | ICD-10-CM | POA: Diagnosis not present

## 2020-07-30 DIAGNOSIS — Z6829 Body mass index (BMI) 29.0-29.9, adult: Secondary | ICD-10-CM | POA: Diagnosis not present

## 2020-07-30 DIAGNOSIS — I10 Essential (primary) hypertension: Secondary | ICD-10-CM | POA: Diagnosis not present

## 2020-08-01 ENCOUNTER — Ambulatory Visit (HOSPITAL_COMMUNITY)
Admission: RE | Admit: 2020-08-01 | Discharge: 2020-08-01 | Disposition: A | Discharge status: Home / Self Care | Payer: Medicare HMO | Source: Ambulatory Visit | Attending: Urology | Admitting: Urology

## 2020-08-01 ENCOUNTER — Other Ambulatory Visit: Payer: Self-pay

## 2020-08-01 DIAGNOSIS — N281 Cyst of kidney, acquired: Secondary | ICD-10-CM | POA: Diagnosis not present

## 2020-08-01 DIAGNOSIS — N200 Calculus of kidney: Secondary | ICD-10-CM

## 2020-08-06 ENCOUNTER — Encounter: Payer: Self-pay | Admitting: Urology

## 2020-08-06 ENCOUNTER — Other Ambulatory Visit: Payer: Self-pay

## 2020-08-06 ENCOUNTER — Ambulatory Visit (INDEPENDENT_AMBULATORY_CARE_PROVIDER_SITE_OTHER): Payer: Medicare HMO | Admitting: Urology

## 2020-08-06 VITALS — BP 140/60 | HR 51 | Temp 98.3°F | Ht 70.0 in | Wt 200.0 lb

## 2020-08-06 DIAGNOSIS — N200 Calculus of kidney: Secondary | ICD-10-CM

## 2020-08-06 LAB — URINALYSIS, ROUTINE W REFLEX MICROSCOPIC
Bilirubin, UA: NEGATIVE
Glucose, UA: NEGATIVE
Ketones, UA: NEGATIVE
Nitrite, UA: NEGATIVE
Protein,UA: NEGATIVE
RBC, UA: NEGATIVE
Specific Gravity, UA: 1.02 (ref 1.005–1.030)
Urobilinogen, Ur: 0.2 mg/dL (ref 0.2–1.0)
pH, UA: 5 (ref 5.0–7.5)

## 2020-08-06 LAB — MICROSCOPIC EXAMINATION
Bacteria, UA: NONE SEEN
Epithelial Cells (non renal): NONE SEEN /hpf (ref 0–10)
RBC, Urine: NONE SEEN /hpf (ref 0–2)
Renal Epithel, UA: NONE SEEN /hpf

## 2020-08-06 NOTE — Progress Notes (Signed)
Urological Symptom Review  Patient is experiencing the following symptoms: Erection problems (male only)   Review of Systems  Gastrointestinal (upper)  : Negative for upper GI symptoms  Gastrointestinal (lower) : Negative for lower GI symptoms  Constitutional : Negative for symptoms  Skin: Negative for skin symptoms  Eyes: Negative for eye symptoms  Ear/Nose/Throat : negative  Hematologic/Lymphatic: Negative for Hematologic/Lymphatic symptoms  Cardiovascular : Negative for cardiovascular symptoms  Respiratory : Negative for respiratory symptoms  Endocrine: Negative for endocrine symptoms  Musculoskeletal: Joint pain  Neurological: Negative for neurological symptoms  Psychologic: Negative for psychiatric symptoms

## 2020-08-06 NOTE — Patient Instructions (Signed)
Dietary Guidelines to Help Prevent Kidney Stones Kidney stones are deposits of minerals and salts that form inside your kidneys. Your risk of developing kidney stones may be greater depending on your diet, your lifestyle, the medicines you take, and whether you have certain medical conditions. Most people can reduce their chances of developing kidney stones by following the instructions below. Depending on your overall health and the type of kidney stones you tend to develop, your dietitian may give you more specific instructions. What are tips for following this plan? Reading food labels  Choose foods with "no salt added" or "low-salt" labels. Limit your sodium intake to less than 1500 mg per day.  Choose foods with calcium for each meal and snack. Try to eat about 300 mg of calcium at each meal. Foods that contain 200-500 mg of calcium per serving include: ? 8 oz (237 ml) of milk, fortified nondairy milk, and fortified fruit juice. ? 8 oz (237 ml) of kefir, yogurt, and soy yogurt. ? 4 oz (118 ml) of tofu. ? 1 oz of cheese. ? 1 cup (300 g) of dried figs. ? 1 cup (91 g) of cooked broccoli. ? 1-3 oz can of sardines or mackerel.  Most people need 1000 to 1500 mg of calcium each day. Talk to your dietitian about how much calcium is recommended for you. Shopping  Buy plenty of fresh fruits and vegetables. Most people do not need to avoid fruits and vegetables, even if they contain nutrients that may contribute to kidney stones.  When shopping for convenience foods, choose: ? Whole pieces of fruit. ? Premade salads with dressing on the side. ? Low-fat fruit and yogurt smoothies.  Avoid buying frozen meals or prepared deli foods.  Look for foods with live cultures, such as yogurt and kefir. Cooking  Do not add salt to food when cooking. Place a salt shaker on the table and allow each person to add his or her own salt to taste.  Use vegetable protein, such as beans, textured vegetable  protein (TVP), or tofu instead of meat in pasta, casseroles, and soups. Meal planning   Eat less salt, if told by your dietitian. To do this: ? Avoid eating processed or premade food. ? Avoid eating fast food.  Eat less animal protein, including cheese, meat, poultry, or fish, if told by your dietitian. To do this: ? Limit the number of times you have meat, poultry, fish, or cheese each week. Eat a diet free of meat at least 2 days a week. ? Eat only one serving each day of meat, poultry, fish, or seafood. ? When you prepare animal protein, cut pieces into small portion sizes. For most meat and fish, one serving is about the size of one deck of cards.  Eat at least 5 servings of fresh fruits and vegetables each day. To do this: ? Keep fruits and vegetables on hand for snacks. ? Eat 1 piece of fruit or a handful of berries with breakfast. ? Have a salad and fruit at lunch. ? Have two kinds of vegetables at dinner.  Limit foods that are high in a substance called oxalate. These include: ? Spinach. ? Rhubarb. ? Beets. ? Potato chips and french fries. ? Nuts.  If you regularly take a diuretic medicine, make sure to eat at least 1-2 fruits or vegetables high in potassium each day. These include: ? Avocado. ? Banana. ? Orange, prune, carrot, or tomato juice. ? Baked potato. ? Cabbage. ? Beans and split   peas. General instructions   Drink enough fluid to keep your urine clear or pale yellow. This is the most important thing you can do.  Talk to your health care provider and dietitian about taking daily supplements. Depending on your health and the cause of your kidney stones, you may be advised: ? Not to take supplements with vitamin C. ? To take a calcium supplement. ? To take a daily probiotic supplement. ? To take other supplements such as magnesium, fish oil, or vitamin B6.  Take all medicines and supplements as told by your health care provider.  Limit alcohol intake to no  more than 1 drink a day for nonpregnant women and 2 drinks a day for men. One drink equals 12 oz of beer, 5 oz of wine, or 1 oz of hard liquor.  Lose weight if told by your health care provider. Work with your dietitian to find strategies and an eating plan that works best for you. What foods are not recommended? Limit your intake of the following foods, or as told by your dietitian. Talk to your dietitian about specific foods you should avoid based on the type of kidney stones and your overall health. Grains Breads. Bagels. Rolls. Baked goods. Salted crackers. Cereal. Pasta. Vegetables Spinach. Rhubarb. Beets. Canned vegetables. Pickles. Olives. Meats and other protein foods Nuts. Nut butters. Large portions of meat, poultry, or fish. Salted or cured meats. Deli meats. Hot dogs. Sausages. Dairy Cheese. Beverages Regular soft drinks. Regular vegetable juice. Seasonings and other foods Seasoning blends with salt. Salad dressings. Canned soups. Soy sauce. Ketchup. Barbecue sauce. Canned pasta sauce. Casseroles. Pizza. Lasagna. Frozen meals. Potato chips. French fries. Summary  You can reduce your risk of kidney stones by making changes to your diet.  The most important thing you can do is drink enough fluid. You should drink enough fluid to keep your urine clear or pale yellow.  Ask your health care provider or dietitian how much protein from animal sources you should eat each day, and also how much salt and calcium you should have each day. This information is not intended to replace advice given to you by your health care provider. Make sure you discuss any questions you have with your health care provider. Document Revised: 12/27/2018 Document Reviewed: 08/17/2016 Elsevier Patient Education  2020 [employer]  

## 2020-08-06 NOTE — Progress Notes (Signed)
08/06/2020 2:07 PM   Derek [name] Jun 12, 1946 [id number]  Referring provider: [place]wardt, [place], PA [place],  [place] [address]  nephrolithiasis  HPI: Derek [name] is a 74yo here for followup for nephrolithiasis. He had a stone event 5 months ago. No other stone events. He drinks 80oz of water daily. He stopped sodium bicarb. No flank pain. No LUTS. Renal US shows stable bilateral renal calculi   PMH: Past Medical History:  Diagnosis Date  . Arthritis    fingers  . CAD (coronary artery disease)    CABG 2010 - LIMA to LAD, SVG to diagonal, SVG to PDA and PLV  . Essential hypertension   . GERD (gastroesophageal reflux disease)   . History of iron deficiency anemia   . History of kidney stones   . History of pneumonia   . Hyperlipidemia   . NSTEMI (non-ST elevated myocardial infarction) ([place]) 2010  . Type 2 diabetes mellitus [facility])     Surgical History: Past Surgical History:  Procedure Laterality Date  . CORONARY ARTERY BYPASS GRAFT  2010   QUADRUPLE BYPASS AFTER HEART ATTACK   . CYSTOSCOPY W/ URETERAL STENT PLACEMENT Right 05/16/2019   Procedure: CYSTOSCOPY WITH RETROGRADE PYELOGRAM/URETERAL STENT PLACEMENT;  Surgeon: Bjorn Loser, MD;  Location: [facility];  Service: Urology;  Laterality: Right;  . CYSTOSCOPY W/ URETERAL STENT REMOVAL Right 06/04/2019   Procedure: CYSTOSCOPY WITH STENT REMOVAL;  Surgeon: Franchot Gallo, MD;  Location: [facility];  Service: Urology;  Laterality: Right;  . CYSTOSCOPY WITH HOLMIUM LASER LITHOTRIPSY Right 06/04/2019   Procedure: CYSTOSCOPY, URETEROSCOPY, WITH HOLMIUM LASER, STENT PLACEMENT;  Surgeon: Franchot Gallo, MD;  Location: [facility];  Service: Urology;  Laterality: Right;  . CYSTOSCOPY/URETEROSCOPY/HOLMIUM LASER/STENT PLACEMENT Left 06/04/2019   Procedure: CYSTOSCOPY, URETEROSCOPY, RETROGRADE PYELOGRAM, STENT PLACEMENT;  Surgeon: Franchot Gallo, MD;  Location: [facility];  Service: Urology;  Laterality: Left;  . PILONIDAL CYST  EXCISION  1960    Home Medications:  Allergies as of 08/06/2020   No Known Allergies     Medication List       Accurate as of August 06, 2020  2:07 PM. If you have any questions, ask your nurse or doctor.        amLODipine 2.5 MG tablet Commonly known as: NORVASC Take 2.5 mg by mouth daily.   Aspercreme w/Lidocaine 4 % Crea Generic drug: Lidocaine HCl Apply 1 application topically daily as needed (pain).   aspirin EC 81 MG tablet Take 81 mg by mouth daily.   [place] Dream Arthritis 0.025 % Crea Generic drug: Histamine Dihydrochloride Apply 1 application topically daily as needed (pain).   Cholecalciferol 25 MCG (1000 UT) capsule Take by mouth.   hydrochlorothiazide 12.5 MG tablet Commonly known as: HYDRODIURIL Take 1 tablet (12.5 mg total) by mouth daily.   lisinopril 20 MG tablet Commonly known as: ZESTRIL Take by mouth.   lisinopril 20 MG tablet Commonly known as: ZESTRIL Take 1 tablet (20 mg total) by mouth 2 (two) times daily.   loratadine 10 MG tablet Commonly known as: CLARITIN Take 10 mg by mouth daily.   losartan 25 MG tablet Commonly known as: COZAAR Take 25 mg by mouth daily.   metFORMIN 500 MG 24 hr tablet Commonly known as: GLUCOPHAGE-XR Take 1,000 mg by mouth 2 (two) times daily with a meal.   metFORMIN 500 MG tablet Commonly known as: GLUCOPHAGE Take by mouth.   metoprolol succinate 25 MG 24 hr tablet Commonly known as: Toprol XL Take 0.5  tablets (12.5 mg total) by mouth daily.   metoprolol tartrate 50 MG tablet Commonly known as: LOPRESSOR Take 25 mg by mouth 2 (two) times daily.   nitroGLYCERIN 0.4 MG SL tablet Commonly known as: NITROSTAT Place 1 tablet (0.4 mg total) under the tongue every 5 (five) minutes as needed for chest pain.   Potassium Citrate 15 MEQ (1620 MG) Tbcr Take by mouth.   pravastatin 40 MG tablet Commonly known as: PRAVACHOL Take 40 mg by mouth daily.   sodium bicarbonate 650 MG tablet Take 1  tablet (650 mg total) by mouth 2 (two) times daily.   tamsulosin 0.4 MG Caps capsule Commonly known as: FLOMAX Take by mouth.   traMADol 50 MG tablet Commonly known as: ULTRAM Take by mouth.   traMADol 50 MG tablet Commonly known as: ULTRAM Take 50 mg by mouth 2 (two) times daily.       Allergies: No Known Allergies  Family History: Family History  Problem Relation Age of Onset  . Thyroid disease Mother   . Heart attack Father     Social History:  reports that he quit smoking about 11 years ago. His smoking use included cigarettes. He has a 36.00 pack-year smoking history. He has never used smokeless tobacco. He reports current alcohol use. He reports that he does not use drugs.  ROS: All other review of systems were reviewed and are negative except what is noted above in HPI  Physical Exam: BP 140/60   Pulse (!) 51   Temp 98.3 F (36.8 C)   Ht 5\' 10"  (1.778 m)   Wt 200 lb (90.7 kg)   BMI 28.70 kg/m   Constitutional:  Alert and oriented, No acute distress. HEENT: [place] AT, moist mucus membranes.  Trachea midline, no masses. Cardiovascular: No clubbing, cyanosis, or edema. Respiratory: Normal respiratory effort, no increased work of breathing. GI: Abdomen is soft, nontender, nondistended, no abdominal masses GU: No CVA tenderness.  Lymph: No cervical or inguinal lymphadenopathy. Skin: No rashes, bruises or suspicious lesions. Neurologic: Grossly intact, no focal deficits, moving all 4 extremities. Psychiatric: Normal mood and affect.  Laboratory Data: Lab Results  Component Value Date   WBC 11.2 (H) 06/01/2019   HGB 12.5 (L) 06/01/2019   HCT 39.2 06/01/2019   MCV 100.0 06/01/2019   PLT 286 06/01/2019    Lab Results  Component Value Date   CREATININE 2.15 (H) 06/01/2019    No results found for: PSA  No results found for: TESTOSTERONE  Lab Results  Component Value Date   HGBA1C 6.6 (H) 05/16/2019    Urinalysis    Component Value Date/Time    COLORURINE YELLOW 05/17/2019 [place] 05/17/2019 1041   LABSPEC 1.014 05/17/2019 1041   PHURINE 6.0 05/17/2019 1041   GLUCOSEU 50 (A) 05/17/2019 1041   HGBUR LARGE (A) 05/17/2019 1041   BILIRUBINUR neg 11/07/2019 [place] 05/17/2019 1041   PROTEINUR Negative 11/07/2019 0954   PROTEINUR 30 (A) 05/17/2019 1041   UROBILINOGEN 0.2 11/07/2019 0954   NITRITE neg 11/07/2019 0954   NITRITE NEGATIVE 05/17/2019 1041   LEUKOCYTESUR Small (1+) (A) 11/07/2019 0954   LEUKOCYTESUR MODERATE (A) 05/17/2019 1041    Lab Results  Component Value Date   BACTERIA NONE SEEN 05/17/2019    Pertinent Imaging: Renal US 08/01/2020: Images reviewed and discussed with the patient No results found for this or any previous visit.  No results found for this or any previous visit.  No results found  for this or any previous visit.  No results found for this or any previous visit.  Results for orders placed during the hospital encounter of 08/01/20  Ultrasound renal complete  Narrative CLINICAL DATA:  Nephrolithiasis  EXAM: RENAL / URINARY TRACT ULTRASOUND COMPLETE  COMPARISON:  Renal ultrasound Feb 04, 2020; CT abdomen and pelvis November 02, 2019  FINDINGS: Right Kidney:  Renal measurements: 12.3 x 6.9 x 5.9 cm = volume: 261 mL. Echogenicity and renal cortical thickness are within normal limits. No mass or perinephric fluid. There is dilatation of apparent upper to mid pole calices with questionable mild dilatation in the pelvis versus parapelvic cyst with mild debris. There are calculi in the right kidney, largest measuring 5 mm. No ureterectasis.  Left Kidney:  Renal measurements: 12.4 x 6.2 x 5.1 cm = volume: 204 mL. Echogenicity and renal cortical thickness within normal limits. No perinephric fluid. There is a cyst in the mid left kidney measuring 1.4 x 2.1 x 1.4 cm. No hydronephrosis is appreciable currently. There are scattered calculi in the left  kidney, largest measuring 4 mm. No ureterectasis.  Bladder:  Appears normal for degree of bladder distention. Flow from each distal ureter seen in the bladder.  Other:  None.  IMPRESSION: 1.  There are calculi in each kidney.  2. Suspect upper and mid kidney infundibular stenosis with dilatation of these areas on the right. Question debris containing parapelvic cyst versus mild pelviectasis on the right. No ureterectasis.  3.  Cyst left kidney measuring 1.4 x 2.1 x 1.4 cm.   Electronically Signed By: Lowella Grip III M.D. On: 08/01/2020 15:27  No results found for this or any previous visit.  No results found for this or any previous visit.  Results for orders placed during the hospital encounter of 11/02/19  CT RENAL STONE STUDY  Narrative CLINICAL DATA:  Right-sided flank pain. Low back pain for 3 weeks.  EXAM: CT ABDOMEN AND PELVIS WITHOUT CONTRAST  TECHNIQUE: Multidetector CT imaging of the abdomen and pelvis was performed following the standard protocol without IV contrast.  COMPARISON:  Are multiple renal ultrasound 08/02/2019 and CT abdomen 10/11/2019  FINDINGS: Lower chest: Stable scarring in the right middle lobe. Chronic sub solid density posteromedially in both lower lobes, left greater than right, likely due to minimal atelectasis/scar. Coronary atherosclerosis.  Hepatobiliary: Unremarkable  Pancreas: Unremarkable  Spleen: Unremarkable  Adrenals/Urinary Tract: Both adrenal glands appear normal.  Six nonobstructive right renal calculi are present measuring up to 1.0 cm in long axis (image 51/5)  Four left renal calculi are present measuring up to 1.4 cm in long axis (image 57/5). The dominant 1.4 cm calculus is in the vicinity of a left kidney upper pole infundibulum, and there appears to be downstream caliceal blunting/dilatation suggesting blockage of this infundibulum, a similar appearance to 1/20 09/1998 21  The previous right  UPJ calculus shown on the 10/11/2019 exam is no longer seen.  Stomach/Bowel: Wall thickening is present diffusely in the rectum although may be from nondistention. Descending and sigmoid colon diverticulosis noted with scattered diverticula elsewhere in the colon, but no findings of active diverticulitis. Normal appendix.  Vascular/Lymphatic: Aortoiliac atherosclerotic vascular disease. No pathologic adenopathy.  Reproductive: Unremarkable  Other: No supplemental non-categorized findings.  Musculoskeletal: Prior median sternotomy. Bridging spurring of both sacroiliac joints. Lumbar spondylosis and degenerative disc disease causing multilevel impingement. Transitional S1 vertebra.  IMPRESSION: 1. Bilateral nonobstructive nephrolithiasis. The dominant 1.4 cm calculus is in a left kidney upper pole infundibulum, and  there is downstream caliceal blunting/dilatation in the left kidney upper pole suggesting blockage of this infundibulum. 2. Other imaging findings of potential clinical significance: Descending and sigmoid colon diverticulosis. Aortoiliac atherosclerotic vascular disease. Lumbar spondylosis and degenerative disc disease causing multilevel impingement. Bridging spurring of both sacroiliac joints. Transitional S1 vertebra.   Electronically Signed By: Van Clines M.D. On: 11/02/2019 12:27   Assessment & Plan:    1. Calculus, renal -RTC 1 year with renal US - Urinalysis, Routine w reflex microscopic   No follow-ups on file.  Nicolette Bang, MD  [facility] [place]

## 2020-08-08 DIAGNOSIS — H8111 Benign paroxysmal vertigo, right ear: Secondary | ICD-10-CM | POA: Diagnosis not present

## 2020-08-08 DIAGNOSIS — R42 Dizziness and giddiness: Secondary | ICD-10-CM | POA: Diagnosis not present

## 2020-08-08 DIAGNOSIS — Z6829 Body mass index (BMI) 29.0-29.9, adult: Secondary | ICD-10-CM | POA: Diagnosis not present

## 2020-08-11 ENCOUNTER — Inpatient Hospital Stay (HOSPITAL_COMMUNITY): Payer: Medicare HMO | Attending: Hematology | Admitting: Hematology

## 2020-08-11 ENCOUNTER — Inpatient Hospital Stay (HOSPITAL_COMMUNITY): Payer: Medicare HMO

## 2020-08-11 ENCOUNTER — Other Ambulatory Visit: Payer: Self-pay

## 2020-08-11 ENCOUNTER — Encounter (HOSPITAL_COMMUNITY): Payer: Self-pay | Admitting: Hematology

## 2020-08-11 DIAGNOSIS — D472 Monoclonal gammopathy: Secondary | ICD-10-CM

## 2020-08-11 DIAGNOSIS — Z79899 Other long term (current) drug therapy: Secondary | ICD-10-CM | POA: Diagnosis not present

## 2020-08-11 DIAGNOSIS — I129 Hypertensive chronic kidney disease with stage 1 through stage 4 chronic kidney disease, or unspecified chronic kidney disease: Secondary | ICD-10-CM | POA: Insufficient documentation

## 2020-08-11 DIAGNOSIS — N1831 Chronic kidney disease, stage 3a: Secondary | ICD-10-CM | POA: Diagnosis not present

## 2020-08-11 LAB — CBC WITH DIFFERENTIAL/PLATELET
Abs Immature Granulocytes: 0.03 10*3/uL (ref 0.00–0.07)
Basophils Absolute: 0 10*3/uL (ref 0.0–0.1)
Basophils Relative: 0 %
Eosinophils Absolute: 0.2 10*3/uL (ref 0.0–0.5)
Eosinophils Relative: 2 %
HCT: 36.4 % — ABNORMAL LOW (ref 39.0–52.0)
Hemoglobin: 11.5 g/dL — ABNORMAL LOW (ref 13.0–17.0)
Immature Granulocytes: 0 %
Lymphocytes Relative: 19 %
Lymphs Abs: 1.7 10*3/uL (ref 0.7–4.0)
MCH: 30.9 pg (ref 26.0–34.0)
MCHC: 31.6 g/dL (ref 30.0–36.0)
MCV: 97.8 fL (ref 80.0–100.0)
Monocytes Absolute: 0.8 10*3/uL (ref 0.1–1.0)
Monocytes Relative: 9 %
Neutro Abs: 6.2 10*3/uL (ref 1.7–7.7)
Neutrophils Relative %: 70 %
Platelets: 336 10*3/uL (ref 150–400)
RBC: 3.72 MIL/uL — ABNORMAL LOW (ref 4.22–5.81)
RDW: 11.8 % (ref 11.5–15.5)
WBC: 8.9 10*3/uL (ref 4.0–10.5)
nRBC: 0 % (ref 0.0–0.2)

## 2020-08-11 LAB — COMPREHENSIVE METABOLIC PANEL
ALT: 15 U/L (ref 0–44)
AST: 11 U/L — ABNORMAL LOW (ref 15–41)
Albumin: 4.1 g/dL (ref 3.5–5.0)
Alkaline Phosphatase: 58 U/L (ref 38–126)
Anion gap: 8 (ref 5–15)
BUN: 45 mg/dL — ABNORMAL HIGH (ref 8–23)
CO2: 25 mmol/L (ref 22–32)
Calcium: 9.5 mg/dL (ref 8.9–10.3)
Chloride: 105 mmol/L (ref 98–111)
Creatinine, Ser: 2.87 mg/dL — ABNORMAL HIGH (ref 0.61–1.24)
GFR, Estimated: 23 mL/min — ABNORMAL LOW (ref >60)
Glucose, Bld: 148 mg/dL — ABNORMAL HIGH (ref 70–99)
Potassium: 5.7 mmol/L — ABNORMAL HIGH (ref 3.5–5.1)
Sodium: 138 mmol/L (ref 135–145)
Total Bilirubin: 0.3 mg/dL (ref 0.3–1.2)
Total Protein: 8.2 g/dL — ABNORMAL HIGH (ref 6.5–8.1)

## 2020-08-11 LAB — LACTATE DEHYDROGENASE: LDH: 125 U/L (ref 98–192)

## 2020-08-11 NOTE — Patient Instructions (Signed)
[place] at [facility] Discharge Instructions  You were seen and examined today by Dr. Delton Coombes. Dr. Delton Coombes is a hematologist, meaning he specializes in blood disorders. Dr. Delton Coombes discussed your past medical history, family history of cancer and the events that led to you being here today.  Dr. Delton Coombes has recommended additional lab work, which will be done today. Dr. Delton Coombes is trying to identify what the abnormal protein in your blood. You have monoclonal gammopathy. This, rarely, can become a blood cancer. We will most likely watch you every 6 months.  We will have your labs drawn today and see you in 6 weeks.   Thank you for choosing [place] at [facility] to provide your oncology and hematology care.  To afford each patient quality time with our provider, please arrive at least 15 minutes before your scheduled appointment time.   If you have a lab appointment with the [facility] please come in thru the Main Entrance and check in at the main information desk.  You need to re-schedule your appointment should you arrive 10 or more minutes late.  We strive to give you quality time with our providers, and arriving late affects you and other patients whose appointments are after yours.  Also, if you no show three or more times for appointments you may be dismissed from the clinic at the providers discretion.     Again, thank you for choosing [facility]  Our hope is that these requests will decrease the amount of time that you wait before being seen by our physicians.       _____________________________________________________________  Should you have questions after your visit to [facility], please contact our office at [phone] and follow the prompts.  Our office hours are 8:00 a.m. and 4:30 p.m. Monday - Friday.  Please note that voicemails left after 4:00 p.m. may not be returned until the  following business day.  We are closed weekends and major holidays.  You do have access to a nurse 24-7, just call the main number to the clinic [phone] and do not press any options, hold on the line and a nurse will answer the phone.    For prescription refill requests, have your pharmacy contact our office and allow 72 hours.    Due to Covid, you will need to wear a mask upon entering the hospital. If you do not have a mask, a mask will be given to you at the Main Entrance upon arrival. For doctor visits, patients may have 1 support person age 35 or older with them. For treatment visits, patients can not have anyone with them due to social distancing guidelines and our immunocompromised population.

## 2020-08-11 NOTE — Progress Notes (Signed)
[place], [place] [address]   CLINIC:  Medical Oncology/Hematology  Patient Care Team: [place], Yetta Flock, [place] as PCP - General (Family Medicine) [name], MD as PCP - Cardiology (Cardiology) [name], Argie Ramming, RN as [place] Management  CHIEF COMPLAINTS/PURPOSE OF CONSULTATION:  Evaluation of MGUS  HISTORY OF PRESENTING ILLNESS:  [place] 74 y.o. male is here because of evaluation of MGUS, at the request of Dr. Ulice Bold.  Today he reports feeling okay and denies having any new pains, recent infections, F/C, night sweats or unexplained weight loss. He denies any numbness or tingling in his hands or feet. He had a quadruple CABG in 2010 after having an MI.  He lives at home with his wife and he is able to do all of his chores and ADL's. He works part-time as a Dealer. He quit smoking in 2010 after his MI; he smoked 2 PPD for 10 years. He denies any presence of cancers in his family.   MEDICAL HISTORY:  Past Medical History:  Diagnosis Date  . Arthritis    fingers  . CAD (coronary artery disease)    CABG 2010 - LIMA to LAD, SVG to diagonal, SVG to PDA and PLV  . Essential hypertension   . GERD (gastroesophageal reflux disease)   . History of iron deficiency anemia   . History of kidney stones   . History of pneumonia   . Hyperlipidemia   . NSTEMI (non-ST elevated myocardial infarction) ([place]) 2010  . Type 2 diabetes mellitus ([place])     SURGICAL HISTORY: Past Surgical History:  Procedure Laterality Date  . CORONARY ARTERY BYPASS GRAFT  2010   QUADRUPLE BYPASS AFTER HEART ATTACK   . CYSTOSCOPY W/ URETERAL STENT PLACEMENT Right 05/16/2019   Procedure: CYSTOSCOPY WITH RETROGRADE PYELOGRAM/URETERAL STENT PLACEMENT;  Surgeon: Bjorn Loser, MD;  Location: [facility];  Service: Urology;  Laterality: Right;  . CYSTOSCOPY W/ URETERAL STENT REMOVAL Right 06/04/2019   Procedure: CYSTOSCOPY WITH STENT  REMOVAL;  Surgeon: Franchot Gallo, MD;  Location: [facility];  Service: Urology;  Laterality: Right;  . CYSTOSCOPY WITH HOLMIUM LASER LITHOTRIPSY Right 06/04/2019   Procedure: CYSTOSCOPY, URETEROSCOPY, WITH HOLMIUM LASER, STENT PLACEMENT;  Surgeon: Franchot Gallo, MD;  Location: [facility];  Service: Urology;  Laterality: Right;  . CYSTOSCOPY/URETEROSCOPY/HOLMIUM LASER/STENT PLACEMENT Left 06/04/2019   Procedure: CYSTOSCOPY, URETEROSCOPY, RETROGRADE PYELOGRAM, STENT PLACEMENT;  Surgeon: Franchot Gallo, MD;  Location: [facility];  Service: Urology;  Laterality: Left;  . PILONIDAL CYST EXCISION  1960    SOCIAL HISTORY: Social History   Socioeconomic History  . Marital status: Married    Spouse name: [name]  . Number of children: 3  . Years of education: Not on file  . Highest education level: Not on file  Occupational History  . 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 Smoking status: Former Smoker    Packs/day: 3.00    Years: 12.00    Pack years: 36.00    Types: Cigarettes    Quit date: 2010    Years since quitting: 11.8  . Smokeless tobacco: Never Used  Vaping Use  . Vaping Use: Never used  Substance and Sexual Activity  . Alcohol use: Yes    Comment: 6 oz beer each day during the week, not on weekends  . Drug use: Never  . Sexual activity: Not Currently  Other Topics Concern  . Not on file  Social History Narrative  .  Not on file   Social Determinants of Health   Financial Resource Strain: Low Risk   . Difficulty of Paying Living Expenses: Not hard at all  Food Insecurity: No Food Insecurity  . Worried About Charity fundraiser in the Last Year: Never true  . Ran Out of Food in the Last Year: Never true  Transportation Needs: No Transportation Needs  . Lack of Transportation (Medical): No  . Lack of Transportation (Non-Medical): No  Physical Activity: Unknown  . Days of Exercise per Week: 2 days  . Minutes of Exercise per Session: Not on file  Stress:  No Stress Concern Present  . Feeling of Stress : Not at all  Social Connections: Moderately Isolated  . Frequency of Communication with Friends and Family: More than three times a week  . Frequency of Social Gatherings with Friends and Family: Three times a week  . Attends Religious Services: Never  . Active Member of Clubs or Organizations: No  . Attends Archivist Meetings: Never  . Marital Status: Married  Human resources officer Violence: Not At Risk  . Fear of Current or Ex-Partner: No  . Emotionally Abused: No  . Physically Abused: No  . Sexually Abused: No    FAMILY HISTORY: Family History  Problem Relation Age of Onset  . Thyroid disease Mother   . Heart attack Father     ALLERGIES:  has No Known Allergies.  MEDICATIONS:  Current Outpatient Medications  Medication Sig Dispense Refill  . amLODipine (NORVASC) 2.5 MG tablet Take 2.5 mg by mouth daily.    Marland Kitchen aspirin EC 81 MG tablet Take 81 mg by mouth daily.    . Cholecalciferol 25 MCG (1000 UT) capsule Take 1,000 Units by mouth daily.     Marland Kitchen Histamine Dihydrochloride (AUSTRALIAN DREAM ARTHRITIS) 0.025 % CREA Apply 1 application topically daily as needed (pain).    . hydrochlorothiazide (HYDRODIURIL) 12.5 MG tablet Take 1 tablet (12.5 mg total) by mouth daily.    . Lidocaine HCl (ASPERCREME W/LIDOCAINE) 4 % CREA Apply 1 application topically daily as needed (pain).    Marland Kitchen losartan (COZAAR) 25 MG tablet Take 12.5 mg by mouth daily.     . metFORMIN (GLUCOPHAGE-XR) 500 MG 24 hr tablet Take 1,000 mg by mouth 2 (two) times daily with a meal. (Patient not taking: Reported on 08/11/2020)    . metoprolol tartrate (LOPRESSOR) 50 MG tablet Take 25 mg by mouth 2 (two) times daily.    . nitroGLYCERIN (NITROSTAT) 0.4 MG SL tablet Place 1 tablet (0.4 mg total) under the tongue every 5 (five) minutes as needed for chest pain. 25 tablet 3  . pravastatin (PRAVACHOL) 40 MG tablet Take 40 mg by mouth daily.    . tamsulosin (FLOMAX) 0.4 MG CAPS  capsule Take 0.4 mg by mouth daily.     . traMADol (ULTRAM) 50 MG tablet Take 50 mg by mouth 2 (two) times daily.      No current facility-administered medications for this visit.    REVIEW OF SYSTEMS:   Review of Systems  Constitutional: Positive for fatigue (75%). Negative for appetite change, chills, diaphoresis, fever and unexpected weight change.  Musculoskeletal: Negative for arthralgias.  Neurological: Positive for dizziness (d/t vertigo). Negative for numbness.     PHYSICAL EXAMINATION: ECOG PERFORMANCE STATUS: 0 - Asymptomatic  Vitals:   08/11/20 0813  BP: (!) 156/68  Pulse: 66  Resp: 18  Temp: 98.3 F (36.8 C)  SpO2: 100%   Filed Weights  08/11/20 0813  Weight: 199 lb (90.3 kg)   Physical Exam Vitals reviewed.  Constitutional:      Appearance: Normal appearance.  Cardiovascular:     Rate and Rhythm: Normal rate and regular rhythm.     Pulses: Normal pulses.     Heart sounds: Normal heart sounds.  Pulmonary:     Effort: Pulmonary effort is normal.     Breath sounds: Normal breath sounds.  Abdominal:     Palpations: Abdomen is soft. There is no hepatomegaly, splenomegaly or mass.     Tenderness: There is no abdominal tenderness.     Hernia: No hernia is present.  Lymphadenopathy:     Cervical: No cervical adenopathy.     Upper Body:     Right upper body: No supraclavicular, axillary or pectoral adenopathy.     Left upper body: No supraclavicular, axillary or pectoral adenopathy.     Lower Body: No right inguinal adenopathy. No left inguinal adenopathy.  Neurological:     General: No focal deficit present.     Mental Status: He is alert and oriented to person, place, and time.  Psychiatric:        Mood and Affect: Mood normal.        Behavior: Behavior normal.      LABORATORY DATA:  I have reviewed the data as listed Recent Results (from the past 2160 hour(s))  Urinalysis, Routine w reflex microscopic     Status: Abnormal   Collection Time:  08/06/20  2:20 PM  Result Value Ref Range   Specific Gravity, UA 1.020 1.005 - 1.030   pH, UA 5.0 5.0 - 7.5   Color, UA Yellow Yellow   Appearance Ur Clear Clear   Leukocytes,UA Trace (A) Negative   Protein,UA Negative Negative/Trace   Glucose, UA Negative Negative   Ketones, UA Negative Negative   RBC, UA Negative Negative   Bilirubin, UA Negative Negative   Urobilinogen, Ur 0.2 0.2 - 1.0 mg/dL   Nitrite, UA Negative Negative   Microscopic Examination See below:   Microscopic Examination     Status: Abnormal   Collection Time: 08/06/20  2:20 PM   Urine  Result Value Ref Range   WBC, UA 6-10 (A) 0 - 5 /hpf   RBC None seen 0 - 2 /hpf   Epithelial Cells (non renal) None seen 0 - 10 /hpf   Renal Epithel, UA None seen None seen /hpf   Bacteria, UA None seen None seen/Few    RADIOGRAPHIC STUDIES: I have personally reviewed the radiological images as listed and agreed with the findings in the report. Ultrasound renal complete  Result Date: 08/01/2020 CLINICAL DATA:  Nephrolithiasis EXAM: RENAL / URINARY TRACT ULTRASOUND COMPLETE COMPARISON:  Renal ultrasound Feb 04, 2020; CT abdomen and pelvis November 02, 2019 FINDINGS: Right Kidney: Renal measurements: 12.3 x 6.9 x 5.9 cm = volume: 261 mL. Echogenicity and renal cortical thickness are within normal limits. No mass or perinephric fluid. There is dilatation of apparent upper to mid pole calices with questionable mild dilatation in the pelvis versus parapelvic cyst with mild debris. There are calculi in the right kidney, largest measuring 5 mm. No ureterectasis. Left Kidney: Renal measurements: 12.4 x 6.2 x 5.1 cm = volume: 204 mL. Echogenicity and renal cortical thickness within normal limits. No perinephric fluid. There is a cyst in the mid left kidney measuring 1.4 x 2.1 x 1.4 cm. No hydronephrosis is appreciable currently. There are scattered calculi in the left kidney, largest measuring  4 mm. No ureterectasis. Bladder: Appears normal for  degree of bladder distention. Flow from each distal ureter seen in the bladder. Other: None. IMPRESSION: 1.  There are calculi in each kidney. 2. Suspect upper and mid kidney infundibular stenosis with dilatation of these areas on the right. Question debris containing parapelvic cyst versus mild pelviectasis on the right. No ureterectasis. 3.  Cyst left kidney measuring 1.4 x 2.1 x 1.4 cm. Electronically Signed   By: Lowella Grip III M.D.   On: 08/01/2020 15:27    ASSESSMENT:  1.  MGUS: -Patient seen at the request of Dr. Theador Hawthorne for evaluation of MGUS. -Work-up at Dr. Toya Smothers office showed IgG lambda monoclonal protein on urine immunofixation. -Labs on 08/11/2020 shows creatinine 2.87 with calcium 9.5.  Hemoglobin 11.5.  2.  CKD: -CKD stage IIIa, since 2019, thought to be secondary to diabetes. -Possible contributing factor includes obstructive uropathy. -Proteinuria is less than 200 mg with positive microalbuminuria. -Renal ultrasound on 08/01/2020 with stones in both kidneys.  Cyst in the left kidney measuring 1.4 x 2.1 x 1.4 cm. -CT renal study on 11/02/2019 shows normal liver and spleen with no suspicious adenopathy.  3.  Social/family history: -He lives at home with his wife. 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 -He smoked 2 pack/day for 10 years and quit smoking in 2010. -No family history of malignancies.  PLAN: 1.  MGUS: -We have reviewed monoclonal gammopathy in general and MGUS in detail. -I have recommended work-up including SPEP, immunofixation and free light chains along with LDH and beta-2 microglobulin level. -I will also request skeletal survey to evaluate for any lytic lesions. -RTC 4 to 6 weeks to discuss results and surveillance plan.   All questions were answered. The patient knows to call the clinic with any problems, questions or concerns.   Derek Jack, MD 08/11/20 9:04 AM  [place] [phone]   I, Milinda Antis,  am acting as a scribe for Dr. Sanda Linger.  I, Derek Jack MD, have reviewed the above documentation for accuracy and completeness, and I agree with the above.

## 2020-08-12 DIAGNOSIS — N183 Chronic kidney disease, stage 3 unspecified: Secondary | ICD-10-CM | POA: Diagnosis not present

## 2020-08-12 DIAGNOSIS — E875 Hyperkalemia: Secondary | ICD-10-CM | POA: Diagnosis not present

## 2020-08-12 LAB — IMMUNOFIXATION ELECTROPHORESIS
IgA: 313 mg/dL (ref 61–437)
IgG (Immunoglobin G), Serum: 1428 mg/dL (ref 603–1613)
IgM (Immunoglobulin M), Srm: 149 mg/dL — ABNORMAL HIGH (ref 15–143)
Total Protein ELP: 7.5 g/dL (ref 6.0–8.5)

## 2020-08-12 LAB — BETA 2 MICROGLOBULIN, SERUM: Beta-2 Microglobulin: 5.4 mg/L — ABNORMAL HIGH (ref 0.6–2.4)

## 2020-08-25 ENCOUNTER — Encounter (HOSPITAL_COMMUNITY): Payer: Self-pay

## 2020-08-29 DIAGNOSIS — R809 Proteinuria, unspecified: Secondary | ICD-10-CM | POA: Diagnosis not present

## 2020-08-29 DIAGNOSIS — N200 Calculus of kidney: Secondary | ICD-10-CM | POA: Diagnosis not present

## 2020-08-29 DIAGNOSIS — D472 Monoclonal gammopathy: Secondary | ICD-10-CM | POA: Diagnosis not present

## 2020-08-29 DIAGNOSIS — N189 Chronic kidney disease, unspecified: Secondary | ICD-10-CM | POA: Diagnosis not present

## 2020-08-29 DIAGNOSIS — E1122 Type 2 diabetes mellitus with diabetic chronic kidney disease: Secondary | ICD-10-CM | POA: Diagnosis not present

## 2020-08-29 DIAGNOSIS — I129 Hypertensive chronic kidney disease with stage 1 through stage 4 chronic kidney disease, or unspecified chronic kidney disease: Secondary | ICD-10-CM | POA: Diagnosis not present

## 2020-08-29 DIAGNOSIS — E1129 Type 2 diabetes mellitus with other diabetic kidney complication: Secondary | ICD-10-CM | POA: Diagnosis not present

## 2020-08-29 DIAGNOSIS — R768 Other specified abnormal immunological findings in serum: Secondary | ICD-10-CM | POA: Diagnosis not present

## 2020-09-03 ENCOUNTER — Ambulatory Visit (HOSPITAL_COMMUNITY)
Admission: RE | Admit: 2020-09-03 | Discharge: 2020-09-03 | Disposition: A | Discharge status: Home / Self Care | Payer: Medicare HMO | Source: Ambulatory Visit | Attending: Nephrology | Admitting: Nephrology

## 2020-09-03 ENCOUNTER — Other Ambulatory Visit (HOSPITAL_COMMUNITY): Payer: Self-pay | Admitting: Nephrology

## 2020-09-03 ENCOUNTER — Other Ambulatory Visit: Payer: Self-pay | Admitting: Nephrology

## 2020-09-03 ENCOUNTER — Other Ambulatory Visit: Payer: Self-pay

## 2020-09-03 ENCOUNTER — Other Ambulatory Visit: Payer: Self-pay | Admitting: *Deleted

## 2020-09-03 DIAGNOSIS — N170 Acute kidney failure with tubular necrosis: Secondary | ICD-10-CM

## 2020-09-03 DIAGNOSIS — N189 Chronic kidney disease, unspecified: Secondary | ICD-10-CM | POA: Diagnosis not present

## 2020-09-03 DIAGNOSIS — D638 Anemia in other chronic diseases classified elsewhere: Secondary | ICD-10-CM | POA: Diagnosis not present

## 2020-09-03 DIAGNOSIS — E559 Vitamin D deficiency, unspecified: Secondary | ICD-10-CM | POA: Diagnosis not present

## 2020-09-03 DIAGNOSIS — I129 Hypertensive chronic kidney disease with stage 1 through stage 4 chronic kidney disease, or unspecified chronic kidney disease: Secondary | ICD-10-CM | POA: Diagnosis not present

## 2020-09-03 DIAGNOSIS — R809 Proteinuria, unspecified: Secondary | ICD-10-CM | POA: Diagnosis not present

## 2020-09-03 DIAGNOSIS — E875 Hyperkalemia: Secondary | ICD-10-CM | POA: Diagnosis not present

## 2020-09-03 DIAGNOSIS — N200 Calculus of kidney: Secondary | ICD-10-CM | POA: Diagnosis not present

## 2020-09-03 DIAGNOSIS — E1129 Type 2 diabetes mellitus with other diabetic kidney complication: Secondary | ICD-10-CM | POA: Diagnosis not present

## 2020-09-03 DIAGNOSIS — E1122 Type 2 diabetes mellitus with diabetic chronic kidney disease: Secondary | ICD-10-CM | POA: Diagnosis not present

## 2020-09-03 NOTE — Patient Outreach (Signed)
[facility]) Care Management  09/03/2020  Derek [name] [birth year] [id number]  Unsuccessful outreach attempt made to patient. RN Health Coach left HIPAA compliant voicemail message along with her contact information.  Plan: RN Health Coach will call patient within the month of January.  Emelia Loron RN, BSN [place] [phone] [email or web address]

## 2020-09-05 ENCOUNTER — Telehealth: Payer: Self-pay

## 2020-09-05 ENCOUNTER — Other Ambulatory Visit: Payer: Self-pay

## 2020-09-05 DIAGNOSIS — N200 Calculus of kidney: Secondary | ICD-10-CM

## 2020-09-05 NOTE — Telephone Encounter (Signed)
Pt called with complaints of urinary frequency. Pt wishes to drop of urine sample for urine check for possible infection-  Pt recently has stopped his diabetes medication as well.

## 2020-09-08 ENCOUNTER — Other Ambulatory Visit: Payer: Self-pay

## 2020-09-08 ENCOUNTER — Other Ambulatory Visit: Payer: Medicare HMO

## 2020-09-08 DIAGNOSIS — N200 Calculus of kidney: Secondary | ICD-10-CM | POA: Diagnosis not present

## 2020-09-08 LAB — MICROSCOPIC EXAMINATION
Bacteria, UA: NONE SEEN
Epithelial Cells (non renal): NONE SEEN /hpf (ref 0–10)
Renal Epithel, UA: NONE SEEN /hpf

## 2020-09-08 LAB — URINALYSIS, ROUTINE W REFLEX MICROSCOPIC
Bilirubin, UA: NEGATIVE
Glucose, UA: NEGATIVE
Ketones, UA: NEGATIVE
Nitrite, UA: NEGATIVE
Specific Gravity, UA: 1.02 (ref 1.005–1.030)
Urobilinogen, Ur: 0.2 mg/dL (ref 0.2–1.0)
pH, UA: 5.5 (ref 5.0–7.5)

## 2020-09-10 LAB — URINE CULTURE: Organism ID, Bacteria: NO GROWTH

## 2020-09-15 DIAGNOSIS — E875 Hyperkalemia: Secondary | ICD-10-CM | POA: Diagnosis not present

## 2020-09-15 DIAGNOSIS — D510 Vitamin B12 deficiency anemia due to intrinsic factor deficiency: Secondary | ICD-10-CM | POA: Diagnosis not present

## 2020-09-15 DIAGNOSIS — R5383 Other fatigue: Secondary | ICD-10-CM | POA: Diagnosis not present

## 2020-09-15 DIAGNOSIS — Z1321 Encounter for screening for nutritional disorder: Secondary | ICD-10-CM | POA: Diagnosis not present

## 2020-09-15 DIAGNOSIS — N189 Chronic kidney disease, unspecified: Secondary | ICD-10-CM | POA: Diagnosis not present

## 2020-09-15 DIAGNOSIS — R7989 Other specified abnormal findings of blood chemistry: Secondary | ICD-10-CM | POA: Diagnosis not present

## 2020-09-15 DIAGNOSIS — N183 Chronic kidney disease, stage 3 unspecified: Secondary | ICD-10-CM | POA: Diagnosis not present

## 2020-09-15 DIAGNOSIS — D6489 Other specified anemias: Secondary | ICD-10-CM | POA: Diagnosis not present

## 2020-09-15 DIAGNOSIS — D508 Other iron deficiency anemias: Secondary | ICD-10-CM | POA: Diagnosis not present

## 2020-09-16 ENCOUNTER — Telehealth: Payer: Self-pay

## 2020-09-16 NOTE — Telephone Encounter (Signed)
-----   Message from Malen Gauze, MD sent at 09/16/2020 11:06 AM EST ----- normal ----- Message ----- From: Ferdinand Lango, RN Sent: 09/10/2020   9:44 AM EST To: Malen Gauze, MD  Please review

## 2020-09-16 NOTE — Telephone Encounter (Signed)
Pt called. No answer. Left message.

## 2020-09-17 DIAGNOSIS — N189 Chronic kidney disease, unspecified: Secondary | ICD-10-CM | POA: Diagnosis not present

## 2020-09-17 DIAGNOSIS — D638 Anemia in other chronic diseases classified elsewhere: Secondary | ICD-10-CM | POA: Diagnosis not present

## 2020-09-17 DIAGNOSIS — E1122 Type 2 diabetes mellitus with diabetic chronic kidney disease: Secondary | ICD-10-CM | POA: Diagnosis not present

## 2020-09-17 DIAGNOSIS — N170 Acute kidney failure with tubular necrosis: Secondary | ICD-10-CM | POA: Diagnosis not present

## 2020-09-17 DIAGNOSIS — N132 Hydronephrosis with renal and ureteral calculous obstruction: Secondary | ICD-10-CM | POA: Diagnosis not present

## 2020-09-17 DIAGNOSIS — E875 Hyperkalemia: Secondary | ICD-10-CM | POA: Diagnosis not present

## 2020-09-17 DIAGNOSIS — R809 Proteinuria, unspecified: Secondary | ICD-10-CM | POA: Diagnosis not present

## 2020-09-17 DIAGNOSIS — I129 Hypertensive chronic kidney disease with stage 1 through stage 4 chronic kidney disease, or unspecified chronic kidney disease: Secondary | ICD-10-CM | POA: Diagnosis not present

## 2020-09-17 DIAGNOSIS — E1129 Type 2 diabetes mellitus with other diabetic kidney complication: Secondary | ICD-10-CM | POA: Diagnosis not present

## 2020-09-19 DIAGNOSIS — E1122 Type 2 diabetes mellitus with diabetic chronic kidney disease: Secondary | ICD-10-CM | POA: Diagnosis not present

## 2020-09-19 DIAGNOSIS — I129 Hypertensive chronic kidney disease with stage 1 through stage 4 chronic kidney disease, or unspecified chronic kidney disease: Secondary | ICD-10-CM | POA: Diagnosis not present

## 2020-09-19 DIAGNOSIS — N183 Chronic kidney disease, stage 3 unspecified: Secondary | ICD-10-CM | POA: Diagnosis not present

## 2020-09-22 ENCOUNTER — Inpatient Hospital Stay (HOSPITAL_COMMUNITY): Payer: Medicare HMO | Attending: Hematology | Admitting: Hematology

## 2020-10-01 ENCOUNTER — Other Ambulatory Visit: Payer: Self-pay | Admitting: *Deleted

## 2020-10-01 NOTE — Patient Instructions (Addendum)
Goals Addressed            This Visit's Progress   . Patient will maintain a B/P below 160/90 winthin the next 90 days.   On track    [facility] (see longitudinal plan of care for additional care plan information)  Objective:  . Last practice recorded BP readings:  BP Readings from Last 3 Encounters:  02/06/20 (!) 162/88  11/07/19 135/62  07/16/19 (!) 160/72 .   Marland Kitchen Most recent eGFR/CrCl: No results found for: EGFR  No components found for: CRCL  Current Barriers:  Marland Kitchen Knowledge deficit related to self care management of hypertension  Case Manager Clinical Goal(s):  Marland Kitchen Over the next 90 days, patient will verbalize understanding of plan for hypertension management . Over the next 90 days, patient will attend all scheduled medical appointments:   . Over the next 90 days, patient will demonstrate improved adherence to prescribed treatment plan for hypertension as evidenced by taking all medications as prescribed, monitoring and recording blood pressure as directed, adhering to low sodium/DASH diet  Interventions:  . Evaluation of current treatment plan related to hypertension self management and patient's adherence to plan as established by provider. . Reviewed medications with patient and discussed importance of compliance . Provided education regarding s/s of DASH diet/Low salt diet . Provided education regarding complications of uncontrolled blood pressure . Provided education regarding increasing physical activity  Patient Self Care Activities:  . Self administers medications as prescribed . Attends all scheduled provider appointments . Calls provider office for new concerns, questions, or BP outside discussed parameters . Monitors BP and records as discussed . Adheres to a low sodium diet/DASH diet . Increase physical activity as tolerated  Please see past updates related to this goal by clicking on the "Past Updates" button in the selected goal    Timeframe:  Long-Range  Goal Priority:  High Start Date: 06/16/20                            Expected End Date: 03/19/21 Follow Up Date: 01/17/21 Updated: 10/01/20                          . Track and Manage My Blood Pressure-Hypertension   On track    Timeframe:  Long-Range Goal Priority:  High Start Date:  10/01/20                           Expected End Date: 03/19/21                      Follow Up Date 01/17/21    - check blood pressure daily - write blood pressure results in a log or diary    Why is this important?    You won't feel high blood pressure, but it can still hurt your blood vessels.   High blood pressure can cause heart or kidney problems. It can also cause a stroke.   Making lifestyle changes like losing a little weight or eating less salt will help.   Checking your blood pressure at home and at different times of the day can help to control blood pressure.   If the doctor prescribes medicine remember to take it the way the doctor ordered.   Call the office if you cannot afford the medicine or if there are questions about it.  Notes: Patient reports taking his blood pressure daily and recording the values.

## 2020-10-01 NOTE — Patient Outreach (Signed)
[facility]) Care Management  [place]  10/01/2020   Derek [name] 1946/05/10 [id number]  Subjective: Successful telephone outreach call to patient. HIPAA identifiers obtained. Patient reports he is doing well. He explains that he recently saw his nephrologist and his kidney function has improved. Patient states he is currently taking his B/P daily and recording the values; ranges are 977'S systolic to 14'E diastolic. Adding that this is a little elevated since his hypertensive medications were changed to protect his kidneys. Patient reports having an upcoming PCP appointment scheduled for 10/31/20. He plans to bring his recorded B/P values to show PCP. Patient continues to limit his sodium intake and eat a renal friendly diet. 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 Patient is well supported by his wife Derek [name] and family. Patient did not have any further questions or concerns today and did confirm that the patient has this nurse's contact number to call her if needed.   Encounter Medications:  Outpatient Encounter Medications as of 10/01/2020  Medication Sig Note  . amLODipine (NORVASC) 2.5 MG tablet Take 2.5 mg by mouth daily.   Marland Kitchen aspirin EC 81 MG tablet Take 81 mg by mouth daily.   . Cholecalciferol 25 MCG (1000 UT) capsule Take 1,000 Units by mouth daily.    Marland Kitchen Histamine Dihydrochloride (AUSTRALIAN DREAM ARTHRITIS) 0.025 % CREA Apply 1 application topically daily as needed (pain).   . hydrochlorothiazide (HYDRODIURIL) 12.5 MG tablet Take 1 tablet (12.5 mg total) by mouth daily.   . Lidocaine HCl (ASPERCREME W/LIDOCAINE) 4 % CREA Apply 1 application topically daily as needed (pain).   Marland Kitchen losartan (COZAAR) 25 MG tablet Take 12.5 mg by mouth daily.  (Patient not taking: Reported on 10/01/2020) 10/01/2020: Provider stopped  . metFORMIN (GLUCOPHAGE-XR) 500 MG 24 hr tablet Take 1,000 mg by mouth 2 (two) times daily with a meal. (Patient  not taking: No sig reported) 10/01/2020: Provider stopped   . metoprolol tartrate (LOPRESSOR) 50 MG tablet Take 25 mg by mouth 2 (two) times daily.   . nitroGLYCERIN (NITROSTAT) 0.4 MG SL tablet Place 1 tablet (0.4 mg total) under the tongue every 5 (five) minutes as needed for chest pain.   . pravastatin (PRAVACHOL) 40 MG tablet Take 40 mg by mouth daily.   . tamsulosin (FLOMAX) 0.4 MG CAPS capsule Take 0.4 mg by mouth daily.    . traMADol (ULTRAM) 50 MG tablet Take 50 mg by mouth 2 (two) times daily.     No facility-administered encounter medications on file as of 10/01/2020.    Functional Status:  In your present state of health, do you have any difficulty performing the following activities: 01/18/2020  Hearing? N  Vision? N  Difficulty concentrating or making decisions? N  Walking or climbing stairs? N  Dressing or bathing? N  Doing errands, shopping? N  Preparing Food and eating ? N  Using the Toilet? N  In the past six months, have you accidently leaked urine? N  Do you have problems with loss of bowel control? N  Managing your Medications? N  Managing your Finances? N  Housekeeping or managing your Housekeeping? N  Some recent data might be hidden    Fall/Depression Screening: Fall Risk  10/01/2020 06/16/2020 01/18/2020  Falls in the past year? '1 1 1  ' Comment - - due to an accident while working  Number falls in past yr: 0 0 0  Injury with Fall? 0  0 0  Risk for fall due to : History of fall(s) - History of fall(s)  Follow up Falls prevention discussed;Falls evaluation completed;Education provided Falls prevention discussed;Education provided;Falls evaluation completed Falls prevention discussed;Education provided;Falls evaluation completed   PHQ 2/9 Scores 08/11/2020 01/18/2020 06/01/2019  PHQ - 2 Score 0 0 0    Assessment:  Goals Addressed            This Visit's Progress   . Patient will maintain a B/P below 160/90 winthin the next 90 days.   On track    [place] (see longitudinal plan of care for additional care plan information)  Objective:  . Last practice recorded BP readings:  BP Readings from Last 3 Encounters:  02/06/20 (!) 162/88  11/07/19 135/62  07/16/19 (!) 160/72 .   Marland Kitchen Most recent eGFR/CrCl: No results found for: EGFR  No components found for: CRCL  Current Barriers:  Marland Kitchen Knowledge deficit related to self care management of hypertension  Case Manager Clinical Goal(s):  Marland Kitchen Over the next 90 days, patient will verbalize understanding of plan for hypertension management . Over the next 90 days, patient will attend all scheduled medical appointments:   . Over the next 90 days, patient will demonstrate improved adherence to prescribed treatment plan for hypertension as evidenced by taking all medications as prescribed, monitoring and recording blood pressure as directed, adhering to low sodium/DASH diet  Interventions:  . Evaluation of current treatment plan related to hypertension self management and patient's adherence to plan as established by provider. . Reviewed medications with patient and discussed importance of compliance . Provided education regarding s/s of DASH diet/Low salt diet . Provided education regarding complications of uncontrolled blood pressure . Provided education regarding increasing physical activity  Patient Self Care Activities:  . Self administers medications as prescribed . Attends all scheduled provider appointments . Calls provider office for new concerns, questions, or BP outside discussed parameters . Monitors BP and records as discussed . Adheres to a low sodium diet/DASH diet . Increase physical activity as tolerated  Please see past updates related to this goal by clicking on the "Past Updates" button in the selected goal    Timeframe:  Long-Range Goal Priority:  High Start Date: 06/16/20                            Expected End Date: 03/19/21 Follow Up Date: 01/17/21 Updated: 10/01/20                           . Track and Manage My Blood Pressure-Hypertension   On track    Timeframe:  Long-Range Goal Priority:  High Start Date:  10/01/20                           Expected End Date: 03/19/21                      Follow Up Date 01/17/21    - check blood pressure daily - write blood pressure results in a log or diary    Why is this important?    You won't feel high blood pressure, but it can still hurt your blood vessels.   High blood pressure can cause heart or kidney problems. It can also cause a stroke.   Making lifestyle changes like losing a little weight or eating  less salt will help.   Checking your blood pressure at home and at different times of the day can help to control blood pressure.   If the doctor prescribes medicine remember to take it the way the doctor ordered.   Call the office if you cannot afford the medicine or if there are questions about it.     Notes: Patient reports taking his blood pressure daily and recording the values.       Plan: RN Health Coach will send PCP today's assessment note for a quarterly update, and will call patient within the month of April. Follow-up:  Patient agrees to Care Plan and Follow-up.   Emelia Loron RN, BSN [place] [phone] [email or web address]

## 2020-10-23 ENCOUNTER — Ambulatory Visit (HOSPITAL_COMMUNITY)
Admission: RE | Admit: 2020-10-23 | Discharge: 2020-10-23 | Disposition: A | Discharge status: Home / Self Care | Payer: Medicare HMO | Source: Ambulatory Visit | Attending: Urology | Admitting: Urology

## 2020-10-23 ENCOUNTER — Encounter: Payer: Self-pay | Admitting: Urology

## 2020-10-23 ENCOUNTER — Other Ambulatory Visit: Payer: Self-pay

## 2020-10-23 ENCOUNTER — Ambulatory Visit (INDEPENDENT_AMBULATORY_CARE_PROVIDER_SITE_OTHER): Payer: Medicare HMO | Admitting: Urology

## 2020-10-23 VITALS — BP 156/70 | HR 59 | Temp 98.3°F | Ht 70.0 in | Wt 201.0 lb

## 2020-10-23 DIAGNOSIS — N200 Calculus of kidney: Secondary | ICD-10-CM | POA: Diagnosis not present

## 2020-10-23 DIAGNOSIS — K5730 Diverticulosis of large intestine without perforation or abscess without bleeding: Secondary | ICD-10-CM | POA: Diagnosis not present

## 2020-10-23 DIAGNOSIS — R1011 Right upper quadrant pain: Secondary | ICD-10-CM | POA: Insufficient documentation

## 2020-10-23 DIAGNOSIS — I700 Atherosclerosis of aorta: Secondary | ICD-10-CM | POA: Diagnosis not present

## 2020-10-23 LAB — URINALYSIS, ROUTINE W REFLEX MICROSCOPIC
Bilirubin, UA: NEGATIVE
Glucose, UA: NEGATIVE
Ketones, UA: NEGATIVE
Leukocytes,UA: NEGATIVE
Nitrite, UA: NEGATIVE
Protein,UA: NEGATIVE
RBC, UA: NEGATIVE
Specific Gravity, UA: 1.015 (ref 1.005–1.030)
Urobilinogen, Ur: 0.2 mg/dL (ref 0.2–1.0)
pH, UA: 5 (ref 5.0–7.5)

## 2020-10-23 NOTE — Progress Notes (Signed)
Urological Symptom Review  Patient is experiencing the following symptoms: Erection problems (male only)   Review of Systems  Gastrointestinal (upper)  : Negative for upper GI symptoms  Gastrointestinal (lower) : Negative for lower GI symptoms  Constitutional : Negative for symptoms  Skin: Negative for skin symptoms  Eyes: Negative for eye symptoms  Ear/Nose/Throat : Negative for Ear/Nose/Throat symptoms  Hematologic/Lymphatic: Negative for Hematologic/Lymphatic symptoms  Cardiovascular : Negative for cardiovascular symptoms  Respiratory : Negative for respiratory symptoms  Endocrine: Negative for endocrine symptoms  Musculoskeletal: Joint pain  Neurological: Negative for neurological symptoms  Psychologic: Negative for psychiatric symptoms

## 2020-10-23 NOTE — Progress Notes (Signed)
10/23/2020 2:50 PM   Derek [name] 01-23-1946 [id number]  Referring provider: Allwardt, Randa Evens, PA-C [address] [place],  [place] [address]  hydronephrosis  HPI: Derek [name] is a 75yo here for evaluation of new right hydronephrosis. He underwent renal US 09/03/2020 which showed bilateral renal calculi and new right hydronephrosis. He had right flank pain at that time which has since resolved. He does not recall passing a stone. NO significant LUTS. No hematuria.    PMH: Past Medical History:  Diagnosis Date  . Arthritis    fingers  . CAD (coronary artery disease)    CABG 2010 - LIMA to LAD, SVG to diagonal, SVG to PDA and PLV  . Essential hypertension   . GERD (gastroesophageal reflux disease)   . History of iron deficiency anemia   . History of kidney stones   . History of pneumonia   . Hyperlipidemia   . NSTEMI (non-ST elevated myocardial infarction) ([place]) 2010  . Type 2 diabetes mellitus [facility])     Surgical History: Past Surgical History:  Procedure Laterality Date  . CORONARY ARTERY BYPASS GRAFT  2010   QUADRUPLE BYPASS AFTER HEART ATTACK   . CYSTOSCOPY W/ URETERAL STENT PLACEMENT Right 05/16/2019   Procedure: CYSTOSCOPY WITH RETROGRADE PYELOGRAM/URETERAL STENT PLACEMENT;  Surgeon: Bjorn Loser, MD;  Location: [facility];  Service: Urology;  Laterality: Right;  . CYSTOSCOPY W/ URETERAL STENT REMOVAL Right 06/04/2019   Procedure: CYSTOSCOPY WITH STENT REMOVAL;  Surgeon: Franchot Gallo, MD;  Location: [facility];  Service: Urology;  Laterality: Right;  . CYSTOSCOPY WITH HOLMIUM LASER LITHOTRIPSY Right 06/04/2019   Procedure: CYSTOSCOPY, URETEROSCOPY, WITH HOLMIUM LASER, STENT PLACEMENT;  Surgeon: Franchot Gallo, MD;  [facility]: [facility];  Service: Urology;  Laterality: Right;  . CYSTOSCOPY/URETEROSCOPY/HOLMIUM LASER/STENT PLACEMENT Left 06/04/2019   Procedure: CYSTOSCOPY, URETEROSCOPY, RETROGRADE PYELOGRAM, STENT PLACEMENT;  Surgeon: Franchot Gallo, MD;  Location:  [facility];  Service: Urology;  Laterality: Left;  . PILONIDAL CYST EXCISION  1960    Home Medications:  Allergies as of 10/23/2020   No Known Allergies     Medication List       Accurate as of October 23, 2020  2:50 PM. If you have any questions, ask your nurse or doctor.        amLODipine 2.5 MG tablet Commonly known as: NORVASC Take 2.5 mg by mouth daily.   aspirin EC 81 MG tablet Take 81 mg by mouth daily.   [place] Dream Arthritis 0.025 % Crea Generic drug: Histamine Dihydrochloride Apply 1 application topically daily as needed (pain).   Cholecalciferol 25 MCG (1000 UT) capsule Take 1,000 Units by mouth daily.   hydrochlorothiazide 12.5 MG tablet Commonly known as: HYDRODIURIL Take 1 tablet (12.5 mg total) by mouth daily.   Lidocaine HCl 4 % Crea Apply 1 application topically daily as needed (pain).   losartan 25 MG tablet Commonly known as: COZAAR Take 12.5 mg by mouth daily.   metFORMIN 500 MG 24 hr tablet Commonly known as: GLUCOPHAGE-XR Take 1,000 mg by mouth 2 (two) times daily with a meal.   metoprolol tartrate 50 MG tablet Commonly known as: LOPRESSOR Take 25 mg by mouth 2 (two) times daily.   nitroGLYCERIN 0.4 MG SL tablet Commonly known as: NITROSTAT Place 1 tablet (0.4 mg total) under the tongue every 5 (five) minutes as needed for chest pain.   pravastatin 40 MG tablet Commonly known as: PRAVACHOL Take 40 mg by mouth daily.   tamsulosin 0.4 MG Caps capsule Commonly  known as: FLOMAX Take 0.4 mg by mouth daily.   traMADol 50 MG tablet Commonly known as: ULTRAM Take 50 mg by mouth 2 (two) times daily.       Allergies: No Known Allergies  Family History: Family History  Problem Relation Age of Onset  . Thyroid disease Mother   . Heart attack Father     Social History:  reports that he quit smoking about 12 years ago. His smoking use included cigarettes. He has a 36.00 pack-year smoking history. He has never used smokeless tobacco.  He reports current alcohol use. He reports that he does not use drugs.  ROS: All other review of systems were reviewed and are negative except what is noted above in HPI  Physical Exam: BP (!) 156/70   Pulse (!) 59   Temp 98.3 F (36.8 C)   Ht 5\' 10"  (1.778 m)   Wt 201 lb (91.2 kg)   BMI 28.84 kg/m   Constitutional:  Alert and oriented, No acute distress. HEENT: [place] AT, moist mucus membranes.  Trachea midline, no masses. Cardiovascular: No clubbing, cyanosis, or edema. Respiratory: Normal respiratory effort, no increased work of breathing. GI: Abdomen is soft, nontender, nondistended, no abdominal masses GU: No CVA tenderness.  Lymph: No cervical or inguinal lymphadenopathy. Skin: No rashes, bruises or suspicious lesions. Neurologic: Grossly intact, no focal deficits, moving all 4 extremities. Psychiatric: Normal mood and affect.  Laboratory Data: Lab Results  Component Value Date   WBC 8.9 08/11/2020   HGB 11.5 (L) 08/11/2020   HCT 36.4 (L) 08/11/2020   MCV 97.8 08/11/2020   PLT 336 08/11/2020    Lab Results  Component Value Date   CREATININE 2.87 (H) 08/11/2020    No results found for: PSA  No results found for: TESTOSTERONE  Lab Results  Component Value Date   HGBA1C 6.6 (H) 05/16/2019    Urinalysis    Component Value Date/Time   COLORURINE YELLOW 05/17/2019 1041   APPEARANCEUR Clear 10/23/2020 1419   LABSPEC 1.014 05/17/2019 1041   PHURINE 6.0 05/17/2019 1041   GLUCOSEU Negative 10/23/2020 1419   HGBUR LARGE (A) 05/17/2019 1041   BILIRUBINUR Negative 10/23/2020 1419   KETONESUR NEGATIVE 05/17/2019 1041   PROTEINUR Negative 10/23/2020 1419   PROTEINUR 30 (A) 05/17/2019 1041   UROBILINOGEN 0.2 11/07/2019 0954   NITRITE Negative 10/23/2020 1419   NITRITE NEGATIVE 05/17/2019 1041   LEUKOCYTESUR Negative 10/23/2020 1419   LEUKOCYTESUR MODERATE (A) 05/17/2019 1041    Lab Results  Component Value Date   LABMICR Comment 10/23/2020   WBCUA 0-5  09/08/2020   LABEPIT None seen 09/08/2020   MUCUS Present 09/08/2020   BACTERIA None seen 09/08/2020    Pertinent Imaging: Renal US 09/03/2020: Images reviewed and discussed with the patient No results found for this or any previous visit.  No results found for this or any previous visit.  No results found for this or any previous visit.  No results found for this or any previous visit.  Results for orders placed during the hospital encounter of 09/03/20  US RENAL  Narrative CLINICAL DATA:  Acute kidney failure  EXAM: RENAL / URINARY TRACT ULTRASOUND COMPLETE  COMPARISON:  08/01/2020, 02/04/2020, CT 11/02/2019  FINDINGS: Right Kidney:  Renal measurements: 14.2 x 6.2 x 6.8 cm = volume: 335.2 mL. Echogenicity within normal limits. There is moderate right hydronephrosis which is similar to 08/01/2020, but increased compared with 02/04/2020. 1.6 cm shadowing echogenic focus lower pole right kidney likely representing a kidney  stone.  Left Kidney:  Renal measurements: 13.8 x 6.7 x 5.8 cm = volume: 276.3 mL. Cortical echogenicity is normal. No hydronephrosis. 1.8 cm shadowing echogenic focus lower pole left kidney, likely representing a stone.  Bladder:  Appears normal for degree of bladder distention.  Other:  None.  IMPRESSION: 1. Bilateral intrarenal calculi. Moderate right hydronephrosis, suggest correlation with CT KUB to assess for distal obstruction. 2. Negative for left hydronephrosis   Electronically Signed By: Donavan Foil M.D. On: 09/03/2020 15:20  No results found for this or any previous visit.  No results found for this or any previous visit.  Results for orders placed during the hospital encounter of 11/02/19  CT RENAL STONE STUDY  Narrative CLINICAL DATA:  Right-sided flank pain. Low back pain for 3 weeks.  EXAM: CT ABDOMEN AND PELVIS WITHOUT CONTRAST  TECHNIQUE: Multidetector CT imaging of the abdomen and pelvis was  performed following the standard protocol without IV contrast.  COMPARISON:  Are multiple renal ultrasound 08/02/2019 and CT abdomen 10/11/2019  FINDINGS: Lower chest: Stable scarring in the right middle lobe. Chronic sub solid density posteromedially in both lower lobes, left greater than right, likely due to minimal atelectasis/scar. Coronary atherosclerosis.  Hepatobiliary: Unremarkable  Pancreas: Unremarkable  Spleen: Unremarkable  Adrenals/Urinary Tract: Both adrenal glands appear normal.  Six nonobstructive right renal calculi are present measuring up to 1.0 cm in long axis (image 51/5)  Four left renal calculi are present measuring up to 1.4 cm in long axis (image 57/5). The dominant 1.4 cm calculus is in the vicinity of a left kidney upper pole infundibulum, and there appears to be downstream caliceal blunting/dilatation suggesting blockage of this infundibulum, a similar appearance to 1/20 09/1998 21  The previous right UPJ calculus shown on the 10/11/2019 exam is no longer seen.  Stomach/Bowel: Wall thickening is present diffusely in the rectum although may be from nondistention. Descending and sigmoid colon diverticulosis noted with scattered diverticula elsewhere in the colon, but no findings of active diverticulitis. Normal appendix.  Vascular/Lymphatic: Aortoiliac atherosclerotic vascular disease. No pathologic adenopathy.  Reproductive: Unremarkable  Other: No supplemental non-categorized findings.  Musculoskeletal: Prior median sternotomy. Bridging spurring of both sacroiliac joints. Lumbar spondylosis and degenerative disc disease causing multilevel impingement. Transitional S1 vertebra.  IMPRESSION: 1. Bilateral nonobstructive nephrolithiasis. The dominant 1.4 cm calculus is in a left kidney upper pole infundibulum, and there is downstream caliceal blunting/dilatation in the left kidney upper pole suggesting blockage of this infundibulum. 2.  Other imaging findings of potential clinical significance: Descending and sigmoid colon diverticulosis. Aortoiliac atherosclerotic vascular disease. Lumbar spondylosis and degenerative disc disease causing multilevel impingement. Bridging spurring of both sacroiliac joints. Transitional S1 vertebra.   Electronically Signed By: Van Clines M.D. On: 11/02/2019 12:27   Assessment & Plan:    1. Nephrolithiasis -STAT CT stone study, will call with results - Urinalysis, Routine w reflex microscopic  2. Right upper quadrant abdominal pain -STAT CT stone study - CT RENAL STONE STUDY   No follow-ups on file.  Nicolette Bang, MD  [facility] [place]

## 2020-10-23 NOTE — Patient Instructions (Signed)

## 2020-10-27 ENCOUNTER — Telehealth: Payer: Self-pay

## 2020-10-27 NOTE — Telephone Encounter (Signed)
Called pt and made him aware of kidney stones per Dr. Ruel Favors note.

## 2020-10-27 NOTE — Telephone Encounter (Signed)
-----   Message from Cleon Gustin, MD sent at 10/27/2020  9:50 AM EST ----- Ct showed renal calculi and no ureteral calculi ----- Message ----- From: Valentina Lucks, LPN Sent: 04/20/7710   4:34 PM EST To: Cleon Gustin, MD  Pls review.

## 2020-10-28 DIAGNOSIS — I10 Essential (primary) hypertension: Secondary | ICD-10-CM | POA: Diagnosis not present

## 2020-10-28 DIAGNOSIS — R7989 Other specified abnormal findings of blood chemistry: Secondary | ICD-10-CM | POA: Diagnosis not present

## 2020-10-28 DIAGNOSIS — E1122 Type 2 diabetes mellitus with diabetic chronic kidney disease: Secondary | ICD-10-CM | POA: Diagnosis not present

## 2020-10-31 DIAGNOSIS — D472 Monoclonal gammopathy: Secondary | ICD-10-CM | POA: Diagnosis not present

## 2020-10-31 DIAGNOSIS — N183 Chronic kidney disease, stage 3 unspecified: Secondary | ICD-10-CM | POA: Diagnosis not present

## 2020-10-31 DIAGNOSIS — E875 Hyperkalemia: Secondary | ICD-10-CM | POA: Diagnosis not present

## 2020-10-31 DIAGNOSIS — E1165 Type 2 diabetes mellitus with hyperglycemia: Secondary | ICD-10-CM | POA: Diagnosis not present

## 2020-10-31 DIAGNOSIS — I10 Essential (primary) hypertension: Secondary | ICD-10-CM | POA: Diagnosis not present

## 2020-10-31 DIAGNOSIS — N200 Calculus of kidney: Secondary | ICD-10-CM | POA: Diagnosis not present

## 2020-10-31 DIAGNOSIS — H8111 Benign paroxysmal vertigo, right ear: Secondary | ICD-10-CM | POA: Diagnosis not present

## 2020-10-31 DIAGNOSIS — Z6830 Body mass index (BMI) 30.0-30.9, adult: Secondary | ICD-10-CM | POA: Diagnosis not present

## 2020-11-04 ENCOUNTER — Ambulatory Visit: Payer: Medicare HMO | Admitting: Family Medicine

## 2020-11-04 ENCOUNTER — Encounter: Payer: Self-pay | Admitting: Family Medicine

## 2020-11-04 VITALS — BP 148/78 | HR 45 | Ht 70.0 in | Wt 203.8 lb

## 2020-11-04 DIAGNOSIS — I2510 Atherosclerotic heart disease of native coronary artery without angina pectoris: Secondary | ICD-10-CM | POA: Diagnosis not present

## 2020-11-04 DIAGNOSIS — I10 Essential (primary) hypertension: Secondary | ICD-10-CM | POA: Diagnosis not present

## 2020-11-04 DIAGNOSIS — E782 Mixed hyperlipidemia: Secondary | ICD-10-CM | POA: Diagnosis not present

## 2020-11-04 NOTE — Patient Instructions (Signed)
Medication Instructions:  Continue all current medications.  Labwork: none  Testing/Procedures: none  Follow-Up: 1 year   Any Other Special Instructions Will Be Listed Below (If Applicable).  If you need a refill on your cardiac medications before your next appointment, please call your pharmacy.  

## 2020-11-04 NOTE — Progress Notes (Signed)
Cardiology Office Note  Date: 11/04/2020   ID: Derek [name], DOB 04/19/1946, MRN [id number]  PCP:  Battlement [place] Nation, MD  Cardiologist:  Rozann Lesches, MD Electrophysiologist:  None   Chief Complaint: Cardiac follow-up  History of Present Illness: Derek [name] is a 75 y.o. male with a history of CAD/CABG/NSTEMI, GERD, IDA, kidney stones, HLD, DM 2, HTN.  Last encounter 07/16/2019 Dr. Domenic Polite.  He was referred due to complaint at PCP office of a vague feeling of light vibration in the left side of his chest.  The symptoms ultimately resolved had no exertional pain, palpitations.  NYHA class II dyspnea no syncope.  His medications were reviewed.  He was bradycardic at baseline.  Discussion took place of reducing beta-blocker and change to Toprol-XL.  He had good lipid control overall with LDL of 66 in May of last year on Pravachol.  He was on aspirin, Pravachol and Zestril.  Beta-blocker was changed to Toprol-XL 12.5 mg once daily given a prescription for sublingual nitroglycerin as needed.  He was continuing lisinopril and HCTZ.  Continue Pravachol call with devious LDL at 66.  Patient is here for 1 year follow-up.  He states overall he is feeling well.  Denies any recent acute illnesses or hospitalizations.  He denies any recent anginal or exertional symptoms, palpitations or arrhythmias.  Denies any orthostatic symptoms, CVA or TIA-like symptoms, PND, orthopnea, bleeding issues.  States he had a brief period where he was having some benign positional vertigo which resolved a while back.  Denies any more issues with vertigo-like symptoms.  Has a history of kidney stones and removal.  Sees Dr. Alyson Ingles..  Denies any claudication-like symptoms, DVT or PE-like symptoms.  Blood pressure is elevated today but he states blood pressure is usually in the 782-9 562Z systolic after he takes his blood pressure medications.  He is compliant with all his medications.  He recently saw his  primary care provider.  He sees Dr. Theador Hawthorne.  He has MGUS.  He has diabetes with recent hemoglobin A1c of 7.1%.  Recent creatinine of 1.3.  Recent potassium of 5.3.  Recent CBC and iron studies were unremarkable.  Past Medical History:  Diagnosis Date  . Arthritis    fingers  . CAD (coronary artery disease)    CABG 2010 - LIMA to LAD, SVG to diagonal, SVG to PDA and PLV  . Essential hypertension   . GERD (gastroesophageal reflux disease)   . History of iron deficiency anemia   . History of kidney stones   . History of pneumonia   . Hyperlipidemia   . NSTEMI (non-ST elevated myocardial infarction) ([place]) 2010  . Type 2 diabetes mellitus ([place])     Past Surgical History:  Procedure Laterality Date  . CORONARY ARTERY BYPASS GRAFT  2010   QUADRUPLE BYPASS AFTER HEART ATTACK   . CYSTOSCOPY W/ URETERAL STENT PLACEMENT Right 05/16/2019   Procedure: CYSTOSCOPY WITH RETROGRADE PYELOGRAM/URETERAL STENT PLACEMENT;  Surgeon: Bjorn Loser, MD;  [facility];  Service: Urology;  Laterality: Right;  . CYSTOSCOPY W/ URETERAL STENT REMOVAL Right 06/04/2019   Procedure: CYSTOSCOPY WITH STENT REMOVAL;  Surgeon: Franchot Gallo, MD;  [facility];  Service: Urology;  Laterality: Right;  . CYSTOSCOPY WITH HOLMIUM LASER LITHOTRIPSY Right 06/04/2019   Procedure: CYSTOSCOPY, URETEROSCOPY, WITH HOLMIUM LASER, STENT PLACEMENT;  Surgeon: Franchot Gallo, MD;  [facility];  Service: Urology;  Laterality: Right;  . CYSTOSCOPY/URETEROSCOPY/HOLMIUM LASER/STENT PLACEMENT Left 06/04/2019   Procedure: CYSTOSCOPY, URETEROSCOPY,  RETROGRADE PYELOGRAM, STENT PLACEMENT;  Surgeon: Franchot Gallo, MD;  Location: [facility];  Service: Urology;  Laterality: Left;  . PILONIDAL CYST EXCISION  1960    Current Outpatient Medications  Medication Sig Dispense Refill  . amLODipine (NORVASC) 2.5 MG tablet Take 2.5 mg by mouth daily.    Marland Kitchen aspirin EC 81 MG tablet Take 81 mg by mouth daily.    . Cholecalciferol 25  MCG (1000 UT) capsule Take 1,000 Units by mouth daily.     Marland Kitchen Histamine Dihydrochloride ([place] DREAM ARTHRITIS) 0.025 % CREA Apply 1 application topically daily as needed (pain).    . Lidocaine HCl 4 % CREA Apply 1 application topically daily as needed (pain).    . metoprolol tartrate (LOPRESSOR) 50 MG tablet Take 25 mg by mouth 2 (two) times daily.    . nitroGLYCERIN (NITROSTAT) 0.4 MG SL tablet Place 1 tablet (0.4 mg total) under the tongue every 5 (five) minutes as needed for chest pain. 25 tablet 3  . pravastatin (PRAVACHOL) 40 MG tablet Take 40 mg by mouth daily.    . tamsulosin (FLOMAX) 0.4 MG CAPS capsule Take 0.4 mg by mouth daily.     . traMADol (ULTRAM) 50 MG tablet Take 50 mg by mouth 2 (two) times daily.      No current facility-administered medications for this visit.   Allergies:  Patient has no known allergies.   Social History: The patient  reports that he quit smoking about 12 years ago. His smoking use included cigarettes. He has a 36.00 pack-year smoking history. He has never used smokeless tobacco. He reports current alcohol use. He reports that he does not use drugs.   Family History: The patient's family history includes Heart attack in his father; Thyroid disease in his mother.   ROS:  Please see the history of present illness. Otherwise, complete review of systems is positive for none.  All other systems are reviewed and negative.   Physical Exam: VS:  BP (!) 148/78   Pulse (!) 45   Ht 5\' 10"  (1.778 m)   Wt 203 lb 12.8 oz (92.4 kg)   SpO2 97%   BMI 29.24 kg/m , BMI Body mass index is 29.24 kg/m.  Wt Readings from Last 3 Encounters:  11/04/20 203 lb 12.8 oz (92.4 kg)  10/23/20 201 lb (91.2 kg)  08/11/20 199 lb (90.3 kg)    General: Patient appears comfortable at rest. Neck: Supple, no elevated JVP or carotid bruits, no thyromegaly. Lungs: Clear to auscultation, nonlabored breathing at rest. Cardiac: Regular rate and rhythm, no S3 or significant systolic  murmur, no pericardial rub. Extremities: No pitting edema, distal pulses 2+. Skin: Warm and dry. Musculoskeletal: No kyphosis. Neuropsychiatric: Alert and oriented x3, affect grossly appropriate.  ECG: No recent EKGs recorded in epic.  Last EKG 04/26/2019 sinus rhythm rate of 67  Recent Labwork: 08/11/2020: ALT 15; AST 11; BUN 45; Creatinine, Ser 2.87; Hemoglobin 11.5; Platelets 336; Potassium 5.7; Sodium 138     Component Value Date/Time   CHOL  02/02/2009 0635    196        ATP III CLASSIFICATION:  <200     mg/dL   Desirable  200-239  mg/dL   Borderline High  >=240    mg/dL   High          TRIG 76 02/02/2009 0635   HDL 55 02/02/2009 0635   CHOLHDL 3.6 02/02/2009 0635   VLDL 15 02/02/2009 0635   LDLCALC (H) 02/02/2009 2355  126        Total Cholesterol/HDL:CHD Risk Coronary Heart Disease Risk Table                     Men   Women  1/2 Average Risk   3.4   3.3  Average Risk       5.0   4.4  2 X Average Risk   9.6   7.1  3 X Average Risk  23.4   11.0        Use the calculated Patient Ratio above and the CHD Risk Table to determine the patient's CHD Risk.        ATP III CLASSIFICATION (LDL):  <100     mg/dL   Optimal  100-129  mg/dL   Near or Above                    Optimal  130-159  mg/dL   Borderline  160-189  mg/dL   High  >190     mg/dL   Very High    Other Studies Reviewed Today:  Echocardiogram 02/03/2019: Study Conclusions  Left ventricle: The cavity size was normal. Wall thickness was  increased in a pattern of moderate LVH. Systolic function was  normal. The estimated ejection fraction was in the range of 60% to  65%.   Assessment and Plan:  1. CAD in native artery   2. Mixed hyperlipidemia   3. Essential hypertension    1. CAD in native artery Denies any recent anginal or exertional symptoms.  Continue aspirin 81 mg daily.  Continue metoprolol 25 mg p.o. twice daily.  Continue nitroglycerin sublingual as needed.  2. Mixed  hyperlipidemia Continue Pravachol 40 mg daily.  Last LDL 08/09/2020 was 64  3. Essential hypertension Blood pressure today elevated at 148/78.  Patient states his blood pressure is usually better at home after he takes his blood pressure medications.  States systolic blood pressures usually run from 308-657 systolic.  Continue amlodipine 2.5 mg p.o. daily.  Continue metoprolol 25 mg p.o. twice daily.  Medication Adjustments/Labs and Tests Ordered: Current medicines are reviewed at length with the patient today.  Concerns regarding medicines are outlined above.   Disposition: Follow-up with Dr. Harl Bowie or APP 1 year  Signed, Levell July, NP 11/04/2020 2:10 PM    [facility] [place], [place],  [address] Phone: [phone]; Fax: [phone]

## 2020-11-10 DIAGNOSIS — E875 Hyperkalemia: Secondary | ICD-10-CM | POA: Diagnosis not present

## 2020-11-10 DIAGNOSIS — R918 Other nonspecific abnormal finding of lung field: Secondary | ICD-10-CM | POA: Diagnosis not present

## 2020-11-10 DIAGNOSIS — N200 Calculus of kidney: Secondary | ICD-10-CM | POA: Diagnosis not present

## 2020-11-10 DIAGNOSIS — E1129 Type 2 diabetes mellitus with other diabetic kidney complication: Secondary | ICD-10-CM | POA: Diagnosis not present

## 2020-11-10 DIAGNOSIS — N189 Chronic kidney disease, unspecified: Secondary | ICD-10-CM | POA: Diagnosis not present

## 2020-11-10 DIAGNOSIS — E1122 Type 2 diabetes mellitus with diabetic chronic kidney disease: Secondary | ICD-10-CM | POA: Diagnosis not present

## 2020-11-10 DIAGNOSIS — R809 Proteinuria, unspecified: Secondary | ICD-10-CM | POA: Diagnosis not present

## 2020-11-10 DIAGNOSIS — I129 Hypertensive chronic kidney disease with stage 1 through stage 4 chronic kidney disease, or unspecified chronic kidney disease: Secondary | ICD-10-CM | POA: Diagnosis not present

## 2020-11-14 DIAGNOSIS — Z951 Presence of aortocoronary bypass graft: Secondary | ICD-10-CM | POA: Diagnosis not present

## 2020-11-14 DIAGNOSIS — Z9889 Other specified postprocedural states: Secondary | ICD-10-CM | POA: Diagnosis not present

## 2020-11-14 DIAGNOSIS — R918 Other nonspecific abnormal finding of lung field: Secondary | ICD-10-CM | POA: Diagnosis not present

## 2020-11-17 ENCOUNTER — Ambulatory Visit: Payer: Medicare HMO | Admitting: Urology

## 2020-11-17 DIAGNOSIS — N200 Calculus of kidney: Secondary | ICD-10-CM

## 2020-11-17 DIAGNOSIS — E875 Hyperkalemia: Secondary | ICD-10-CM | POA: Diagnosis not present

## 2020-11-17 DIAGNOSIS — M159 Polyosteoarthritis, unspecified: Secondary | ICD-10-CM | POA: Diagnosis not present

## 2020-11-17 DIAGNOSIS — N183 Chronic kidney disease, stage 3 unspecified: Secondary | ICD-10-CM | POA: Diagnosis not present

## 2020-11-17 DIAGNOSIS — E1122 Type 2 diabetes mellitus with diabetic chronic kidney disease: Secondary | ICD-10-CM | POA: Diagnosis not present

## 2020-11-17 DIAGNOSIS — I129 Hypertensive chronic kidney disease with stage 1 through stage 4 chronic kidney disease, or unspecified chronic kidney disease: Secondary | ICD-10-CM | POA: Diagnosis not present

## 2020-12-02 DIAGNOSIS — E875 Hyperkalemia: Secondary | ICD-10-CM | POA: Diagnosis not present

## 2020-12-02 DIAGNOSIS — Z6830 Body mass index (BMI) 30.0-30.9, adult: Secondary | ICD-10-CM | POA: Diagnosis not present

## 2020-12-02 DIAGNOSIS — D472 Monoclonal gammopathy: Secondary | ICD-10-CM | POA: Diagnosis not present

## 2020-12-02 DIAGNOSIS — N183 Chronic kidney disease, stage 3 unspecified: Secondary | ICD-10-CM | POA: Diagnosis not present

## 2020-12-02 DIAGNOSIS — I10 Essential (primary) hypertension: Secondary | ICD-10-CM | POA: Diagnosis not present

## 2020-12-02 DIAGNOSIS — Z6829 Body mass index (BMI) 29.0-29.9, adult: Secondary | ICD-10-CM | POA: Diagnosis not present

## 2020-12-02 DIAGNOSIS — H8111 Benign paroxysmal vertigo, right ear: Secondary | ICD-10-CM | POA: Diagnosis not present

## 2020-12-02 DIAGNOSIS — N200 Calculus of kidney: Secondary | ICD-10-CM | POA: Diagnosis not present

## 2020-12-02 DIAGNOSIS — E1165 Type 2 diabetes mellitus with hyperglycemia: Secondary | ICD-10-CM | POA: Diagnosis not present

## 2020-12-15 ENCOUNTER — Other Ambulatory Visit: Payer: Medicare HMO

## 2020-12-15 ENCOUNTER — Other Ambulatory Visit: Payer: Self-pay

## 2020-12-15 ENCOUNTER — Telehealth: Payer: Self-pay

## 2020-12-15 ENCOUNTER — Telehealth: Payer: Self-pay | Admitting: Urology

## 2020-12-15 ENCOUNTER — Other Ambulatory Visit: Payer: Self-pay | Admitting: Urology

## 2020-12-15 DIAGNOSIS — R109 Unspecified abdominal pain: Secondary | ICD-10-CM

## 2020-12-15 LAB — URINALYSIS, ROUTINE W REFLEX MICROSCOPIC
Bilirubin, UA: NEGATIVE
Glucose, UA: NEGATIVE
Ketones, UA: NEGATIVE
Leukocytes,UA: NEGATIVE
Nitrite, UA: NEGATIVE
Protein,UA: NEGATIVE
RBC, UA: NEGATIVE
Specific Gravity, UA: 1.02 (ref 1.005–1.030)
Urobilinogen, Ur: 0.2 mg/dL (ref 0.2–1.0)
pH, UA: 6 (ref 5.0–7.5)

## 2020-12-15 MED ORDER — OXYCODONE-ACETAMINOPHEN 5-325 MG PO TABS: 1.0000 | ORAL_TABLET | ORAL | 0 refills | Status: DC | PRN

## 2020-12-15 MED ORDER — ONDANSETRON HCL 4 MG PO TABS: 4.0000 mg | ORAL_TABLET | Freq: Three times a day (TID) | ORAL | 0 refills | Status: AC | PRN

## 2020-12-15 NOTE — Telephone Encounter (Signed)
Pt called again saying he still feels like he has an infection and not a stone. I explained that his urine came back clear so he did not have a urinary infection. He asked about his medicine Dr. Alyson Ingles sent in. He wanted to know what they were for. I explained this. Then he wanted to know if he needed an appointment sooner than scheduled. I told pt I would send note to Dr. Alyson Ingles.

## 2020-12-15 NOTE — Telephone Encounter (Signed)
Pt called this morning and feels like he has a UTI. Pt stated he is running a low grade fever. Please call pt when you can.

## 2020-12-15 NOTE — Telephone Encounter (Signed)
I spoke with Dr. Alyson Ingles and he said pt to come and leave urine specimen for UA and culture. Pt called and notified and scheduled.

## 2020-12-15 NOTE — Telephone Encounter (Signed)
Patient needs to discuss the medication for nausea. He also has a question regarding the kidney stone as well. He thinks it's an infection, not a kidney stone.  Please call pt back at [phone].  Please advise.  Thanks, Helene Kelp

## 2020-12-18 NOTE — Telephone Encounter (Signed)
Yes. See me in 2 weeks with a KUB

## 2020-12-19 ENCOUNTER — Other Ambulatory Visit: Payer: Self-pay

## 2020-12-19 DIAGNOSIS — N200 Calculus of kidney: Secondary | ICD-10-CM

## 2020-12-19 NOTE — Telephone Encounter (Signed)
Order placed. Pt scheduled and notified.

## 2020-12-26 DIAGNOSIS — R809 Proteinuria, unspecified: Secondary | ICD-10-CM | POA: Diagnosis not present

## 2020-12-26 DIAGNOSIS — E1129 Type 2 diabetes mellitus with other diabetic kidney complication: Secondary | ICD-10-CM | POA: Diagnosis not present

## 2020-12-26 DIAGNOSIS — I129 Hypertensive chronic kidney disease with stage 1 through stage 4 chronic kidney disease, or unspecified chronic kidney disease: Secondary | ICD-10-CM | POA: Diagnosis not present

## 2020-12-26 DIAGNOSIS — R918 Other nonspecific abnormal finding of lung field: Secondary | ICD-10-CM | POA: Diagnosis not present

## 2020-12-26 DIAGNOSIS — N200 Calculus of kidney: Secondary | ICD-10-CM | POA: Diagnosis not present

## 2020-12-26 DIAGNOSIS — E1122 Type 2 diabetes mellitus with diabetic chronic kidney disease: Secondary | ICD-10-CM | POA: Diagnosis not present

## 2020-12-26 DIAGNOSIS — N189 Chronic kidney disease, unspecified: Secondary | ICD-10-CM | POA: Diagnosis not present

## 2020-12-26 DIAGNOSIS — E875 Hyperkalemia: Secondary | ICD-10-CM | POA: Diagnosis not present

## 2020-12-29 ENCOUNTER — Other Ambulatory Visit: Payer: Self-pay | Admitting: *Deleted

## 2020-12-29 NOTE — Patient Outreach (Signed)
[facility]) Care Management  12/29/2020  KHYREN HING 1946/06/21 [id number]  Unsuccessful outreach attempt made to patient. RN Health Coach left HIPAA compliant voicemail message along with her contact information.  Plan: RN Health Coach will call patient within the month of May.  Emelia Loron RN, BSN [place] [phone] [email or web address]

## 2021-01-06 ENCOUNTER — Other Ambulatory Visit: Payer: Self-pay

## 2021-01-06 ENCOUNTER — Ambulatory Visit (INDEPENDENT_AMBULATORY_CARE_PROVIDER_SITE_OTHER): Payer: Medicare HMO | Admitting: Urology

## 2021-01-06 ENCOUNTER — Ambulatory Visit (HOSPITAL_COMMUNITY)
Admission: RE | Admit: 2021-01-06 | Discharge: 2021-01-06 | Disposition: A | Discharge status: Home / Self Care | Payer: Medicare HMO | Source: Ambulatory Visit | Attending: Urology | Admitting: Urology

## 2021-01-06 ENCOUNTER — Encounter: Payer: Self-pay | Admitting: Urology

## 2021-01-06 VITALS — BP 136/77 | HR 69 | Temp 98.6°F

## 2021-01-06 DIAGNOSIS — N5201 Erectile dysfunction due to arterial insufficiency: Secondary | ICD-10-CM

## 2021-01-06 DIAGNOSIS — R109 Unspecified abdominal pain: Secondary | ICD-10-CM | POA: Diagnosis not present

## 2021-01-06 DIAGNOSIS — E875 Hyperkalemia: Secondary | ICD-10-CM | POA: Diagnosis not present

## 2021-01-06 DIAGNOSIS — N200 Calculus of kidney: Secondary | ICD-10-CM

## 2021-01-06 MED ORDER — SILDENAFIL CITRATE 100 MG PO TABS: 100.0000 mg | ORAL_TABLET | ORAL | 3 refills | Status: DC | PRN

## 2021-01-06 NOTE — Progress Notes (Signed)
Urological Symptom Review  Patient is experiencing the following symptoms: Erection problems (male only)   Review of Systems  Gastrointestinal (upper)  : Negative for upper GI symptoms  Gastrointestinal (lower) : Negative for lower GI symptoms  Constitutional : Negative for symptoms  Skin: Negative for skin symptoms  Eyes: Blurred vision  Ear/Nose/Throat : Negative for Ear/Nose/Throat symptoms  Hematologic/Lymphatic: Negative for Hematologic/Lymphatic symptoms  Cardiovascular : Negative for cardiovascular symptoms  Respiratory : Negative for respiratory symptoms  Endocrine: Negative for endocrine symptoms  Musculoskeletal: Joint pain  Neurological: Negative for neurological symptoms  Psychologic: Negative for psychiatric symptoms

## 2021-01-06 NOTE — Progress Notes (Signed)
01/06/2021 3:53 PM   Derek [name] 02/19/1946 [id number]  Referring provider: Ridgeway Nation, MD [place],  [place] [address]  nephrolithiasis  HPI: [name] is a 75yo here for followup for nephrolithiasis and new complaint today of erectile dysfunction. He passed his calculus 2 weeks ago. Flank pain resolved. No worsening LUTS. KUB shows stable left renal calculus. His complaint today is worsening erectile dysfunction. He has not tried PDE5s in the past. He gets a semifirm erection that is no firm enough for penetration. Good exercise tolerance.   PMH: Past Medical History:  Diagnosis Date  . Arthritis    fingers  . CAD (coronary artery disease)    CABG 2010 - LIMA to LAD, SVG to diagonal, SVG to PDA and PLV  . Essential hypertension   . GERD (gastroesophageal reflux disease)   . History of iron deficiency anemia   . History of kidney stones   . History of pneumonia   . Hyperlipidemia   . NSTEMI (non-ST elevated myocardial infarction) ([place]) 2010  . Type 2 diabetes mellitus [facility])     Surgical History: Past Surgical History:  Procedure Laterality Date  . CORONARY ARTERY BYPASS GRAFT  2010   QUADRUPLE BYPASS AFTER HEART ATTACK   . CYSTOSCOPY W/ URETERAL STENT PLACEMENT Right 05/16/2019   Procedure: CYSTOSCOPY WITH RETROGRADE PYELOGRAM/URETERAL STENT PLACEMENT;  Surgeon: Bjorn Loser, MD;  Location: [facility];  Service: Urology;  Laterality: Right;  . CYSTOSCOPY W/ URETERAL STENT REMOVAL Right 06/04/2019   Procedure: CYSTOSCOPY WITH STENT REMOVAL;  Surgeon: Franchot Gallo, MD;  Location: [facility];  Service: Urology;  Laterality: Right;  . CYSTOSCOPY WITH HOLMIUM LASER LITHOTRIPSY Right 06/04/2019   Procedure: CYSTOSCOPY, URETEROSCOPY, WITH HOLMIUM LASER, STENT PLACEMENT;  Surgeon: Franchot Gallo, MD;  Location: [facility];  Service: Urology;  Laterality: Right;  . CYSTOSCOPY/URETEROSCOPY/HOLMIUM LASER/STENT PLACEMENT Left 06/04/2019   Procedure:  CYSTOSCOPY, URETEROSCOPY, RETROGRADE PYELOGRAM, STENT PLACEMENT;  Surgeon: Franchot Gallo, MD;  Location: [facility];  Service: Urology;  Laterality: Left;  . PILONIDAL CYST EXCISION  1960    Home Medications:  Allergies as of 01/06/2021   No Known Allergies     Medication List       Accurate as of January 06, 2021  3:53 PM. If you have any questions, ask your nurse or doctor.        amLODipine 2.5 MG tablet Commonly known as: NORVASC Take 2.5 mg by mouth daily.   aspirin EC 81 MG tablet Take 81 mg by mouth daily.   [place] Dream Arthritis 0.025 % Crea Generic drug: Histamine Dihydrochloride Apply 1 application topically daily as needed (pain).   chlorthalidone 25 MG tablet Commonly known as: HYGROTON Take by mouth.   Cholecalciferol 25 MCG (1000 UT) capsule Take 1,000 Units by mouth daily.   Lidocaine HCl 4 % Crea Apply 1 application topically daily as needed (pain).   metoprolol tartrate 50 MG tablet Commonly known as: LOPRESSOR Take 25 mg by mouth 2 (two) times daily.   nitroGLYCERIN 0.4 MG SL tablet Commonly known as: NITROSTAT Place 1 tablet (0.4 mg total) under the tongue every 5 (five) minutes as needed for chest pain.   ondansetron 4 MG tablet Commonly known as: Zofran Take 1 tablet (4 mg total) by mouth every 8 (eight) hours as needed for nausea or vomiting.   oxyCODONE-acetaminophen 5-325 MG tablet Commonly known as: Percocet Take 1 tablet by mouth every 4 (four) hours as needed.   pravastatin 40 MG tablet  Commonly known as: PRAVACHOL Take 40 mg by mouth daily.   tamsulosin 0.4 MG Caps capsule Commonly known as: FLOMAX Take 0.4 mg by mouth daily.   traMADol 50 MG tablet Commonly known as: ULTRAM Take 50 mg by mouth 2 (two) times daily.       Allergies: No Known Allergies  Family History: Family History  Problem Relation Age of Onset  . Thyroid disease Mother   . Heart attack Father     Social History:  reports that he quit smoking  about 12 years ago. His smoking use included cigarettes. He has a 36.00 pack-year smoking history. He has never used smokeless tobacco. He reports current alcohol use. He reports that he does not use drugs.  ROS: All other review of systems were reviewed and are negative except what is noted above in HPI  Physical Exam: BP 136/77   Pulse 69   Temp 98.6 F (37 C)   Constitutional:  Alert and oriented, No acute distress. HEENT: [place] AT, moist mucus membranes.  Trachea midline, no masses. Cardiovascular: No clubbing, cyanosis, or edema. Respiratory: Normal respiratory effort, no increased work of breathing. GI: Abdomen is soft, nontender, nondistended, no abdominal masses GU: No CVA tenderness.  Lymph: No cervical or inguinal lymphadenopathy. Skin: No rashes, bruises or suspicious lesions. Neurologic: Grossly intact, no focal deficits, moving all 4 extremities. Psychiatric: Normal mood and affect.  Laboratory Data: Lab Results  Component Value Date   WBC 8.9 08/11/2020   HGB 11.5 (L) 08/11/2020   HCT 36.4 (L) 08/11/2020   MCV 97.8 08/11/2020   PLT 336 08/11/2020    Lab Results  Component Value Date   CREATININE 2.87 (H) 08/11/2020    No results found for: PSA  No results found for: TESTOSTERONE  Lab Results  Component Value Date   HGBA1C 6.6 (H) 05/16/2019    Urinalysis    Component Value Date/Time   COLORURINE YELLOW 05/17/2019 1041   APPEARANCEUR Clear 12/15/2020 1312   LABSPEC 1.014 05/17/2019 1041   PHURINE 6.0 05/17/2019 1041   GLUCOSEU Negative 12/15/2020 1312   HGBUR LARGE (A) 05/17/2019 1041   BILIRUBINUR Negative 12/15/2020 1312   KETONESUR NEGATIVE 05/17/2019 1041   PROTEINUR Negative 12/15/2020 1312   PROTEINUR 30 (A) 05/17/2019 1041   UROBILINOGEN 0.2 11/07/2019 0954   NITRITE Negative 12/15/2020 1312   NITRITE NEGATIVE 05/17/2019 1041   LEUKOCYTESUR Negative 12/15/2020 1312   LEUKOCYTESUR MODERATE (A) 05/17/2019 1041    Lab Results  Component  Value Date   LABMICR Comment 12/15/2020   WBCUA 0-5 09/08/2020   LABEPIT None seen 09/08/2020   MUCUS Present 09/08/2020   BACTERIA None seen 09/08/2020    Pertinent Imaging: KUB today: Images reviewed and discussed with the patient No results found for this or any previous visit.  No results found for this or any previous visit.  No results found for this or any previous visit.  No results found for this or any previous visit.  Results for orders placed during the hospital encounter of 09/03/20  US RENAL  Narrative CLINICAL DATA:  Acute kidney failure  EXAM: RENAL / URINARY TRACT ULTRASOUND COMPLETE  COMPARISON:  08/01/2020, 02/04/2020, CT 11/02/2019  FINDINGS: Right Kidney:  Renal measurements: 14.2 x 6.2 x 6.8 cm = volume: 335.2 mL. Echogenicity within normal limits. There is moderate right hydronephrosis which is similar to 08/01/2020, but increased compared with 02/04/2020. 1.6 cm shadowing echogenic focus lower pole right kidney likely representing a kidney stone.  Left Kidney:  Renal measurements: 13.8 x 6.7 x 5.8 cm = volume: 276.3 mL. Cortical echogenicity is normal. No hydronephrosis. 1.8 cm shadowing echogenic focus lower pole left kidney, likely representing a stone.  Bladder:  Appears normal for degree of bladder distention.  Other:  None.  IMPRESSION: 1. Bilateral intrarenal calculi. Moderate right hydronephrosis, suggest correlation with CT KUB to assess for distal obstruction. 2. Negative for left hydronephrosis   Electronically Signed By: Donavan Foil M.D. On: 09/03/2020 15:20  No results found for this or any previous visit.  No results found for this or any previous visit.  Results for orders placed in visit on 10/23/20  CT RENAL STONE STUDY  Narrative CLINICAL DATA:  Flank pain.  Nephrolithiasis.  EXAM: CT ABDOMEN AND PELVIS WITHOUT CONTRAST  TECHNIQUE: Multidetector CT imaging of the abdomen and pelvis was  performed following the standard protocol without IV contrast.  COMPARISON:  11/02/2019  FINDINGS: Lower chest: Ground-glass opacity in the posterior left lower lobe is incompletely visualized on this study. It measures at least 2.8 x 2.0 cm and has increased in size since previous study.  Hepatobiliary: No mass visualized on this unenhanced exam. Gallbladder is unremarkable. No evidence of biliary ductal dilatation.  Pancreas: No mass or inflammatory process visualized on this unenhanced exam.  Spleen:  Within normal limits in size.  Adrenals/Urinary tract: Small renal calculi are again seen bilaterally, largest in the lower pole of the left kidney measuring 11 mm. No evidence of ureteral calculi or hydronephrosis. Unremarkable unopacified urinary bladder.  Stomach/Bowel: No evidence of obstruction, inflammatory process, or abnormal fluid collections. Normal appendix visualized. Diffuse colonic diverticulosis is again seen, without evidence of diverticulitis.  Vascular/Lymphatic: No pathologically enlarged lymph nodes identified. No evidence of abdominal aortic aneurysm. Aortic atherosclerotic calcification noted.  Reproductive:  No mass or other significant abnormality.  Other:  None.  Musculoskeletal:  No suspicious bone lesions identified.  IMPRESSION: Bilateral nephrolithiasis. No evidence of ureteral calculi or hydronephrosis.  Colonic diverticulosis. No radiographic evidence of diverticulitis.  Incompletely visualized ground-glass opacity in posterior left lower lobe, with increased size since previous study. Differential diagnosis includes inflammatory or infectious etiologies, and low-grade adenocarcinoma. Recommend short-term follow-up by chest CT without contrast in 3 months.  Aortic Atherosclerosis (ICD10-I70.0).   Electronically Signed By: Marlaine Hind M.D. On: 10/23/2020 15:59   Assessment & Plan:    1. Nephrolithiasis -RTC 1 year with  KUB   No follow-ups on file.  Nicolette Bang, MD  [facility] [place]

## 2021-01-06 NOTE — Patient Instructions (Signed)
Sildenafil tablets (Erectile Dysfunction) What is this medicine? SILDENAFIL (sil DEN a fil) is used to treat erection problems in men. This medicine may be used for other purposes; ask your health care provider or pharmacist if you have questions. COMMON BRAND NAME(S): Viagra What should I tell my health care provider before I take this medicine? They need to know if you have any of these conditions:  bleeding disorders  eye or vision problems, including a rare inherited eye disease called retinitis pigmentosa  anatomical deformation of the penis, Peyronie's disease, or history of priapism (painful and prolonged erection)  heart disease, angina, a history of heart attack, irregular heart beats, or other heart problems  high or low blood pressure  history of blood diseases, like sickle cell anemia or leukemia  history of stomach bleeding  kidney disease  liver disease  stroke  an unusual or allergic reaction to sildenafil, other medicines, foods, dyes, or preservatives  pregnant or trying to get pregnant  breast-feeding How should I use this medicine? Take this medicine by mouth with a glass of water. Follow the directions on the prescription label. The dose is usually taken 1 hour before sexual activity. You should not take the dose more than once per day. Do not take your medicine more often than directed. Talk to your pediatrician regarding the use of this medicine in children. This medicine is not used in children for this condition. Overdosage: If you think you have taken too much of this medicine contact a poison control center or emergency room at once. NOTE: This medicine is only for you. Do not share this medicine with others. What if I miss a dose? This does not apply. Do not take double or extra doses. What may interact with this medicine? Do not take this medicine with any of the following medications:  cisapride  nitrates like amyl nitrite, isosorbide  dinitrate, isosorbide mononitrate, nitroglycerin  riociguat This medicine may also interact with the following medications:  antiviral medicines for HIV or AIDS  bosentan  certain medicines for benign prostatic hyperplasia (BPH)  certain medicines for blood pressure  certain medicines for fungal infections like ketoconazole and itraconazole  cimetidine  erythromycin  rifampin This list may not describe all possible interactions. Give your health care provider a list of all the medicines, herbs, non-prescription drugs, or dietary supplements you use. Also tell them if you smoke, drink alcohol, or use illegal drugs. Some items may interact with your medicine. What should I watch for while using this medicine? If you notice any changes in your vision while taking this drug, call your doctor or health care professional as soon as possible. Stop using this medicine and call your health care provider right away if you have a loss of sight in one or both eyes. Contact your doctor or health care professional right away if you have an erection that lasts longer than 4 hours or if it becomes painful. This may be a sign of a serious problem and must be treated right away to prevent permanent damage. If you experience symptoms of nausea, dizziness, chest pain or arm pain upon initiation of sexual activity after taking this medicine, you should refrain from further activity and call your doctor or health care professional as soon as possible. Do not drink alcohol to excess (examples, 5 glasses of wine or 5 shots of whiskey) when taking this medicine. When taken in excess, alcohol can increase your chances of getting a headache or getting dizzy, increasing  your heart rate or lowering your blood pressure. Using this medicine does not protect you or your partner against HIV infection (the virus that causes AIDS) or other sexually transmitted diseases. What side effects may I notice from receiving this  medicine? Side effects that you should report to your doctor or health care professional as soon as possible:  allergic reactions like skin rash, itching or hives, swelling of the face, lips, or tongue  breathing problems  changes in hearing  changes in vision  chest pain  fast, irregular heartbeat  prolonged or painful erection  seizures Side effects that usually do not require medical attention (report to your doctor or health care professional if they continue or are bothersome):  back pain  dizziness  flushing  headache  indigestion  muscle aches  nausea  stuffy or runny nose This list may not describe all possible side effects. Call your doctor for medical advice about side effects. You may report side effects to FDA at 1-800-FDA-1088. Where should I keep my medicine? Keep out of reach of children. Store at room temperature between 15 and 30 degrees C (59 and 86 degrees F). Throw away any unused medicine after the expiration date. NOTE: This sheet is a summary. It may not cover all possible information. If you have questions about this medicine, talk to your doctor, pharmacist, or health care provider.  2021 Elsevier/Gold Standard (2015-08-20 12:00:25)

## 2021-01-07 LAB — URINALYSIS, ROUTINE W REFLEX MICROSCOPIC
Bilirubin, UA: NEGATIVE
Glucose, UA: NEGATIVE
Ketones, UA: NEGATIVE
Leukocytes,UA: NEGATIVE
Nitrite, UA: NEGATIVE
Protein,UA: NEGATIVE
RBC, UA: NEGATIVE
Specific Gravity, UA: 1.015 (ref 1.005–1.030)
Urobilinogen, Ur: 0.2 mg/dL (ref 0.2–1.0)
pH, UA: 5.5 (ref 5.0–7.5)

## 2021-01-14 DIAGNOSIS — D638 Anemia in other chronic diseases classified elsewhere: Secondary | ICD-10-CM | POA: Diagnosis not present

## 2021-01-14 DIAGNOSIS — N170 Acute kidney failure with tubular necrosis: Secondary | ICD-10-CM | POA: Diagnosis not present

## 2021-01-14 DIAGNOSIS — R809 Proteinuria, unspecified: Secondary | ICD-10-CM | POA: Diagnosis not present

## 2021-01-14 DIAGNOSIS — I129 Hypertensive chronic kidney disease with stage 1 through stage 4 chronic kidney disease, or unspecified chronic kidney disease: Secondary | ICD-10-CM | POA: Diagnosis not present

## 2021-01-14 DIAGNOSIS — E1129 Type 2 diabetes mellitus with other diabetic kidney complication: Secondary | ICD-10-CM | POA: Diagnosis not present

## 2021-01-14 DIAGNOSIS — N189 Chronic kidney disease, unspecified: Secondary | ICD-10-CM | POA: Diagnosis not present

## 2021-01-14 DIAGNOSIS — E1122 Type 2 diabetes mellitus with diabetic chronic kidney disease: Secondary | ICD-10-CM | POA: Diagnosis not present

## 2021-01-14 DIAGNOSIS — E875 Hyperkalemia: Secondary | ICD-10-CM | POA: Diagnosis not present

## 2021-01-17 DIAGNOSIS — M159 Polyosteoarthritis, unspecified: Secondary | ICD-10-CM | POA: Diagnosis not present

## 2021-01-17 DIAGNOSIS — I129 Hypertensive chronic kidney disease with stage 1 through stage 4 chronic kidney disease, or unspecified chronic kidney disease: Secondary | ICD-10-CM | POA: Diagnosis not present

## 2021-01-17 DIAGNOSIS — E1122 Type 2 diabetes mellitus with diabetic chronic kidney disease: Secondary | ICD-10-CM | POA: Diagnosis not present

## 2021-01-17 DIAGNOSIS — N183 Chronic kidney disease, stage 3 unspecified: Secondary | ICD-10-CM | POA: Diagnosis not present

## 2021-01-19 DIAGNOSIS — H2513 Age-related nuclear cataract, bilateral: Secondary | ICD-10-CM | POA: Diagnosis not present

## 2021-01-19 DIAGNOSIS — E119 Type 2 diabetes mellitus without complications: Secondary | ICD-10-CM | POA: Diagnosis not present

## 2021-01-19 DIAGNOSIS — H5203 Hypermetropia, bilateral: Secondary | ICD-10-CM | POA: Diagnosis not present

## 2021-01-29 DIAGNOSIS — R9389 Abnormal findings on diagnostic imaging of other specified body structures: Secondary | ICD-10-CM | POA: Diagnosis not present

## 2021-02-04 ENCOUNTER — Other Ambulatory Visit: Payer: Self-pay | Admitting: *Deleted

## 2021-02-04 NOTE — Patient Outreach (Addendum)
[facility]) Care Management  02/04/2021  Derek [name] [birth year] [id number]   Unsuccessful outreach attempt made to patient. Patient's wife answered the phone and stated that the patient is not available to speak at this time. RN Health Coach left a message with the patient's wife that she will reach back out to the patient at a later date.  Plan: RN Health Coach will call patient within the month of June.  Emelia Loron RN, BSN [place] [phone] [email or web address]

## 2021-02-16 DIAGNOSIS — M159 Polyosteoarthritis, unspecified: Secondary | ICD-10-CM | POA: Diagnosis not present

## 2021-02-16 DIAGNOSIS — E1122 Type 2 diabetes mellitus with diabetic chronic kidney disease: Secondary | ICD-10-CM | POA: Diagnosis not present

## 2021-02-16 DIAGNOSIS — I129 Hypertensive chronic kidney disease with stage 1 through stage 4 chronic kidney disease, or unspecified chronic kidney disease: Secondary | ICD-10-CM | POA: Diagnosis not present

## 2021-02-16 DIAGNOSIS — N183 Chronic kidney disease, stage 3 unspecified: Secondary | ICD-10-CM | POA: Diagnosis not present

## 2021-02-24 DIAGNOSIS — H18513 Endothelial corneal dystrophy, bilateral: Secondary | ICD-10-CM | POA: Diagnosis not present

## 2021-02-24 DIAGNOSIS — H25013 Cortical age-related cataract, bilateral: Secondary | ICD-10-CM | POA: Diagnosis not present

## 2021-02-24 DIAGNOSIS — H18413 Arcus senilis, bilateral: Secondary | ICD-10-CM | POA: Diagnosis not present

## 2021-02-24 DIAGNOSIS — H2513 Age-related nuclear cataract, bilateral: Secondary | ICD-10-CM | POA: Diagnosis not present

## 2021-02-24 DIAGNOSIS — H3581 Retinal edema: Secondary | ICD-10-CM | POA: Diagnosis not present

## 2021-02-24 DIAGNOSIS — H2511 Age-related nuclear cataract, right eye: Secondary | ICD-10-CM | POA: Diagnosis not present

## 2021-03-05 ENCOUNTER — Other Ambulatory Visit: Payer: Self-pay | Admitting: *Deleted

## 2021-03-05 NOTE — Patient Outreach (Signed)
[place] [facility]) Care Management  03/05/2021  DAREN YEAGLE September 02, 1946 [id number]  Unsuccessful outreach attempt made to patient. RN Health Coach left HIPAA compliant voicemail message along with her contact information.  Plan: RN Health Coach will call patient within the month of July.  Emelia Loron RN, BSN [place] [phone] [email or web address]

## 2021-03-13 DIAGNOSIS — H2513 Age-related nuclear cataract, bilateral: Secondary | ICD-10-CM | POA: Diagnosis not present

## 2021-03-13 DIAGNOSIS — H34831 Tributary (branch) retinal vein occlusion, right eye, with macular edema: Secondary | ICD-10-CM | POA: Diagnosis not present

## 2021-03-13 DIAGNOSIS — H35033 Hypertensive retinopathy, bilateral: Secondary | ICD-10-CM | POA: Diagnosis not present

## 2021-03-13 DIAGNOSIS — H35371 Puckering of macula, right eye: Secondary | ICD-10-CM | POA: Diagnosis not present

## 2021-03-19 DIAGNOSIS — E1122 Type 2 diabetes mellitus with diabetic chronic kidney disease: Secondary | ICD-10-CM | POA: Diagnosis not present

## 2021-03-19 DIAGNOSIS — N183 Chronic kidney disease, stage 3 unspecified: Secondary | ICD-10-CM | POA: Diagnosis not present

## 2021-03-19 DIAGNOSIS — I129 Hypertensive chronic kidney disease with stage 1 through stage 4 chronic kidney disease, or unspecified chronic kidney disease: Secondary | ICD-10-CM | POA: Diagnosis not present

## 2021-03-19 DIAGNOSIS — M159 Polyosteoarthritis, unspecified: Secondary | ICD-10-CM | POA: Diagnosis not present

## 2021-04-02 DIAGNOSIS — M1990 Unspecified osteoarthritis, unspecified site: Secondary | ICD-10-CM | POA: Diagnosis not present

## 2021-04-02 DIAGNOSIS — N170 Acute kidney failure with tubular necrosis: Secondary | ICD-10-CM | POA: Diagnosis not present

## 2021-04-02 DIAGNOSIS — N400 Enlarged prostate without lower urinary tract symptoms: Secondary | ICD-10-CM | POA: Diagnosis not present

## 2021-04-02 DIAGNOSIS — E875 Hyperkalemia: Secondary | ICD-10-CM | POA: Diagnosis not present

## 2021-04-02 DIAGNOSIS — N189 Chronic kidney disease, unspecified: Secondary | ICD-10-CM | POA: Diagnosis not present

## 2021-04-02 DIAGNOSIS — N529 Male erectile dysfunction, unspecified: Secondary | ICD-10-CM | POA: Diagnosis not present

## 2021-04-02 DIAGNOSIS — E1122 Type 2 diabetes mellitus with diabetic chronic kidney disease: Secondary | ICD-10-CM | POA: Diagnosis not present

## 2021-04-02 DIAGNOSIS — Z23 Encounter for immunization: Secondary | ICD-10-CM | POA: Diagnosis not present

## 2021-04-02 DIAGNOSIS — I252 Old myocardial infarction: Secondary | ICD-10-CM | POA: Diagnosis not present

## 2021-04-02 DIAGNOSIS — Z8249 Family history of ischemic heart disease and other diseases of the circulatory system: Secondary | ICD-10-CM | POA: Diagnosis not present

## 2021-04-02 DIAGNOSIS — G8929 Other chronic pain: Secondary | ICD-10-CM | POA: Diagnosis not present

## 2021-04-02 DIAGNOSIS — E785 Hyperlipidemia, unspecified: Secondary | ICD-10-CM | POA: Diagnosis not present

## 2021-04-02 DIAGNOSIS — D638 Anemia in other chronic diseases classified elsewhere: Secondary | ICD-10-CM | POA: Diagnosis not present

## 2021-04-02 DIAGNOSIS — I129 Hypertensive chronic kidney disease with stage 1 through stage 4 chronic kidney disease, or unspecified chronic kidney disease: Secondary | ICD-10-CM | POA: Diagnosis not present

## 2021-04-02 DIAGNOSIS — I2510 Atherosclerotic heart disease of native coronary artery without angina pectoris: Secondary | ICD-10-CM | POA: Diagnosis not present

## 2021-04-13 ENCOUNTER — Other Ambulatory Visit: Payer: Self-pay | Admitting: *Deleted

## 2021-04-13 NOTE — Patient Outreach (Signed)
[place] [facility]) Care Management  04/13/2021  SHIV LAUER 1946-07-12 [id number]  Unsuccessful outreach attempt made to patient. Patient's spouse answered the phone and stated that patient would not be able to speak today. Wife did request that this nurse call back at a later date.   Plan: RN Health Coach will call patient within the month of August.  Emelia Loron RN, BSN [place] [phone] [email or web address]

## 2021-04-16 DIAGNOSIS — E875 Hyperkalemia: Secondary | ICD-10-CM | POA: Diagnosis not present

## 2021-04-16 DIAGNOSIS — R809 Proteinuria, unspecified: Secondary | ICD-10-CM | POA: Diagnosis not present

## 2021-04-16 DIAGNOSIS — N189 Chronic kidney disease, unspecified: Secondary | ICD-10-CM | POA: Diagnosis not present

## 2021-04-16 DIAGNOSIS — I129 Hypertensive chronic kidney disease with stage 1 through stage 4 chronic kidney disease, or unspecified chronic kidney disease: Secondary | ICD-10-CM | POA: Diagnosis not present

## 2021-04-16 DIAGNOSIS — E1122 Type 2 diabetes mellitus with diabetic chronic kidney disease: Secondary | ICD-10-CM | POA: Diagnosis not present

## 2021-04-16 DIAGNOSIS — E1129 Type 2 diabetes mellitus with other diabetic kidney complication: Secondary | ICD-10-CM | POA: Diagnosis not present

## 2021-04-19 DIAGNOSIS — M159 Polyosteoarthritis, unspecified: Secondary | ICD-10-CM | POA: Diagnosis not present

## 2021-04-19 DIAGNOSIS — N183 Chronic kidney disease, stage 3 unspecified: Secondary | ICD-10-CM | POA: Diagnosis not present

## 2021-04-19 DIAGNOSIS — I129 Hypertensive chronic kidney disease with stage 1 through stage 4 chronic kidney disease, or unspecified chronic kidney disease: Secondary | ICD-10-CM | POA: Diagnosis not present

## 2021-04-19 DIAGNOSIS — E1122 Type 2 diabetes mellitus with diabetic chronic kidney disease: Secondary | ICD-10-CM | POA: Diagnosis not present

## 2021-04-30 DIAGNOSIS — N200 Calculus of kidney: Secondary | ICD-10-CM | POA: Diagnosis not present

## 2021-04-30 DIAGNOSIS — H8111 Benign paroxysmal vertigo, right ear: Secondary | ICD-10-CM | POA: Diagnosis not present

## 2021-04-30 DIAGNOSIS — E1165 Type 2 diabetes mellitus with hyperglycemia: Secondary | ICD-10-CM | POA: Diagnosis not present

## 2021-04-30 DIAGNOSIS — D472 Monoclonal gammopathy: Secondary | ICD-10-CM | POA: Diagnosis not present

## 2021-04-30 DIAGNOSIS — E875 Hyperkalemia: Secondary | ICD-10-CM | POA: Diagnosis not present

## 2021-04-30 DIAGNOSIS — N183 Chronic kidney disease, stage 3 unspecified: Secondary | ICD-10-CM | POA: Diagnosis not present

## 2021-04-30 DIAGNOSIS — I10 Essential (primary) hypertension: Secondary | ICD-10-CM | POA: Diagnosis not present

## 2021-04-30 DIAGNOSIS — Z6830 Body mass index (BMI) 30.0-30.9, adult: Secondary | ICD-10-CM | POA: Diagnosis not present

## 2021-05-19 ENCOUNTER — Other Ambulatory Visit: Payer: Self-pay | Admitting: *Deleted

## 2021-05-19 NOTE — Patient Outreach (Addendum)
[facility]) Care Management  [place]  05/19/2021   Derek [name] Nov 28, 1945 [id number]  Subjective: Successful telephone outreach call to patient. HIPAA identifiers obtained. Patient states he is dong well at this time. Nurse discussed with patient his health goals and his health and wellness needs which were documented in the Epic system. Patient did not have any further questions or concerns today and did confirm that he has this nurse's contact number to call her if needed.   Encounter Medications:  Outpatient Encounter Medications as of 05/19/2021  Medication Sig Note   amLODipine (NORVASC) 2.5 MG tablet Take 2.5 mg by mouth daily.    aspirin EC 81 MG tablet Take 81 mg by mouth daily.    chlorthalidone (HYGROTON) 25 MG tablet Take by mouth.    Cholecalciferol 25 MCG (1000 UT) capsule Take 1,000 Units by mouth daily.     Histamine Dihydrochloride (AUSTRALIAN DREAM ARTHRITIS) 0.025 % CREA Apply 1 application topically daily as needed (pain).    Lidocaine HCl 4 % CREA Apply 1 application topically daily as needed (pain).    metoprolol tartrate (LOPRESSOR) 50 MG tablet Take 25 mg by mouth 2 (two) times daily.    ondansetron (ZOFRAN) 4 MG tablet Take 1 tablet (4 mg total) by mouth every 8 (eight) hours as needed for nausea or vomiting.    pravastatin (PRAVACHOL) 40 MG tablet Take 40 mg by mouth daily.    sildenafil (VIAGRA) 100 MG tablet Take 1 tablet (100 mg total) by mouth as needed for erectile dysfunction.    tamsulosin (FLOMAX) 0.4 MG CAPS capsule Take 0.4 mg by mouth daily.     traMADol (ULTRAM) 50 MG tablet Take 50 mg by mouth 2 (two) times daily.  05/19/2021: Patient reports taking very infrequently   nitroGLYCERIN (NITROSTAT) 0.4 MG SL tablet Place 1 tablet (0.4 mg total) under the tongue every 5 (five) minutes as needed for chest pain.    oxyCODONE-acetaminophen (PERCOCET) 5-325 MG tablet Take 1 tablet by mouth every 4 (four) hours as needed.  (Patient not taking: Reported on 05/19/2021) 05/19/2021: completed   No facility-administered encounter medications on file as of 05/19/2021.    Functional Status:  No flowsheet data found.  Fall/Depression Screening: Fall Risk  05/19/2021 10/01/2020 06/16/2020  Falls in the past year? _0 Comment - - -  Number falls in past yr: 0 0 0  Injury with Fall? 0 0 0  Risk for fall due to : History of fall(s) History of fall(s) -  Follow up Falls prevention discussed;Education provided;Falls evaluation completed Falls prevention discussed;Falls evaluation completed;Education provided Falls prevention discussed;Education provided;Falls evaluation completed   PHQ 2/9 Scores 08/11/2020 01/18/2020 06/01/2019  PHQ - 2 Score 0 0 0    Assessment:   Care Plan Care Plan : Hypertension (Adult)  Updates made by Michiel Cowboy, RN since 05/19/2021 12:00 AM     Problem: Hypertension (Hypertension)   Priority: High     Long-Range Goal: Hypertension Monitored   Start Date: 10/01/2020  Expected End Date: 10/19/2021  Note:   Evidence-based guidance:  Promote initial use of ambulatory blood pressure measurements (for 3 days) to rule out "white-coat" effect; identify masked hypertension and presence or absence of nocturnal "dipping" of blood pressure.   Encourage continued use of home blood pressure monitoring and recording in blood pressure log; include symptoms of hypotension or potential medication side effects in log.  Review blood pressure measurements taken inside and outside of the  provider office; establish baseline and monitor trends; compare to target ranges or patient goal.  Share overall cardiovascular risk with patient; encourage changes to lifestyle risk factors, including alcohol consumption, smoking, inadequate exercise, poor dietary habits and stress.   Notes:     Task: Identify and Monitor Blood Pressure Elevation   Due Date: 10/19/2021  Note:   Care Management Activities:    - blood  pressure equipment and technique reviewed - home or ambulatory blood pressure monitoring encouraged    Notes:     Problem: Disease Progression (Hypertension)   Priority: High     Long-Range Goal: Disease Progression Prevented or Minimized   Start Date: 10/01/2020  Expected End Date: 10/19/2021  Note:   Evidence-based guidance:  Tailor lifestyle advice to individual; review progress regularly; give frequent encouragement and respond positively to incremental successes.  Assess for and promote awareness of worsening disease or development of comorbidity.  Prepare patient for laboratory and diagnostic exams based on risk and presentation.  Prepare patient for use of pharmacologic therapy that may include diuretic, beta-blocker, beta-blocker/thiazide combination, angiotensin-converting enzyme inhibitor, renin-angiotensin blocker or calcium-channel blocker.  Expect periodic adjustments to pharmacologic therapy; manage side effects.  Promote a healthy diet that includes primarily plant-based foods, such as fruits, vegetables, whole grains, beans and legumes, low-fat dairy and lean meats.   Consider moderate reduction in sodium intake by avoiding the addition of salt to prepared foods and limiting processed meats, canned soup, frozen meals and salty snacks.   Promote a regular, daily exercise goal of 150 minutes per week of moderate exercise based on tolerance, ability and patient choice; consider referral to physical therapist, community wellness and/or activity program.  Encourage the avoidance of no more than 2 hours per day of sedentary activity, such as recreational screen time.  Review sources of stress; explore current coping strategies and encourage use of mindfulness, yoga, meditation or exercise to manage stress.   Notes:     Task: Alleviate Barriers to Hypertension Treatment   Due Date: 10/19/2021  Note:   Care Management Activities:    - difficulty of making life-long changes  acknowledged - healthy diet promoted - healthy family lifestyle promoted - medical nutrition therapy provided - patient response to treatment assessed - reduction of dietary sodium encouraged - reduction in sedentary activities encouraged    Notes:     Care Plan : Diabetes Type 2 (Adult)  Updates made by Michiel Cowboy, RN since 05/19/2021 12:00 AM     Problem: Glycemic Management (Diabetes, Type 2)   Priority: High     Long-Range Goal: Glycemic Management Optimized   Start Date: 05/19/2021  Expected End Date: 05/19/2022  Note:   Evidence-based guidance:  Anticipate A1C testing (point-of-care) every 3 to 6 months based on goal attainment.  Review mutually-set A1C goal or target range.  Anticipate use of antihyperglycemic with or without insulin and periodic adjustments; consider active involvement of pharmacist.  Provide medical nutrition therapy and development of individualized eating.  Compare self-reported symptoms of hypo or hyperglycemia to blood glucose levels, diet and fluid intake, current medications, psychosocial and physiologic stressors, change in activity and barriers to care adherence.  Promote self-monitoring of blood glucose levels.  Assess and address barriers to management plan, such as food insecurity, age, developmental ability, depression, anxiety, fear of hypoglycemia or weight gain, as well as medication cost, side effects and complicated regimen.  Consider referral to community-based diabetes education program, visiting nurse, community health worker or health coach.  Encourage  regular dental care for treatment of periodontal disease; refer to dental provider when needed.   Notes:     Task: Alleviate Barriers to Glycemic Management   Due Date: 05/19/2022  Note:   Care Management Activities:    - blood glucose monitoring encouraged - dental care encouraged - individualized medical nutrition therapy provided - use of blood glucose monitoring log promoted     Notes:    Problem: Disease Progression (Diabetes, Type 2)      Long-Range Goal: Disease Progression Prevented or Minimized   Start Date: 05/19/2021  Expected End Date: 05/19/2022  Priority: High  Note:   Evidence-based guidance:  Prepare patient for laboratory and diagnostic exams based on risk and presentation.  Encourage lifestyle changes, such as increased intake of plant-based foods, stress reduction, consistent physical activity and smoking cessation to prevent long-term complications and chronic disease.   Individualize activity and exercise recommendations while considering potential limitations, such as neuropathy, retinopathy or the ability to prevent hyperglycemia or hypoglycemia.   Prepare patient for use of pharmacologic therapy that may include antihypertensive, analgesic, prostaglandin E1 with periodic adjustments, based on presenting chronic condition and laboratory results.  Assess signs/symptoms and risk factors for hypertension, sleep-disordered breathing, neuropathy (including changes in gait and balance), retinopathy, nephropathy and sexual dysfunction.  Address pregnancy planning and contraceptive choice, especially when prescribing antihypertensive or statin.  Ensure completion of annual comprehensive foot exam and dilated eye exam.   Implement additional individualized goals and interventions based on identified risk factors.  Prepare patient for consultation or referral for specialist care, such as ophthalmology, neurology, cardiology, podiatry, nephrology or perinatology.   Notes:     Task: Monitor and Manage Follow-up for Comorbidities   Due Date: 05/19/2022  Note:   Care Management Activities:    - activity based on tolerance and functional limitations encouraged - completion of annual dilated eye exam confirmed - healthy lifestyle promoted - reduction of sedentary activity encouraged    Notes:       Goals Addressed             This Visit's  Progress    ([facility]) Monitor and Manage My Blood Sugar-Diabetes Type 2       Timeframe:  Long-Range Goal Priority:  High Start Date: 05/19/21                            Expected End Date: 05/19/22                     Follow Up Date 08/19/21  -Encouraged patient to monitor his blood sugar at home several times a week - check blood sugar at prescribed times - enter blood sugar readings and medication or insulin into daily log - take the blood sugar log to all doctor visits  -Discussed monitoring the amount of sugar and carbohydrates to lower blood sugar -Encouraged patient to continue to be physically active by maintaining his yard and by working on old cars routinely   Why is this important?   Checking your blood sugar at home helps to keep it from getting very high or very low.  Writing the results in a diary or log helps the doctor know how to care for you.  Your blood sugar log should have the time, date and the results.  Also, write down the amount of insulin or other medicine that you take.  Other information, like what you ate, exercise done and  how you were feeling, will also be helpful.     Notes: 05/19/21: Patient states that his wife checks his blood sugar periodically at home and the values have been good. Patient feels that his A1c increased to 8.1 due to him eating a lot of sweets. He reports that he has discussed this with his PCP and has modified his diet at home to lower his A1c. His plan is to re-evaluate his A1c in several months to see if his A1c has improved and he will discuss the results with his PCP at that time. Nurse discussed monitoring sugar and carbohydrates. Patient states he has all of the diabetic education he needs currently at home.      [facility]) Track and Manage My Blood Pressure-Hypertension       Timeframe:  Long-Range Goal Priority:  High Start Date:  10/01/20                           Expected End Date: 10/19/20                      Follow Up Date 08/19/21     - check blood pressure 3 times per week - write blood pressure results in a log or diary  -Encouraged patient to continue to stay active -Encouraged patient to continue to eat a low sodium diet  Why is this important?   You won't feel high blood pressure, but it can still hurt your blood vessels.  High blood pressure can cause heart or kidney problems. It can also cause a stroke.  Making lifestyle changes like losing a little weight or eating less salt will help.  Checking your blood pressure at home and at different times of the day can help to control blood pressure.  If the doctor prescribes medicine remember to take it the way the doctor ordered.  Call the office if you cannot afford the medicine or if there are questions about it.     Notes: 05/19/21: Patient reports taking his blood pressure 2-3 times weekly and recording the values. He states the values are good but did not have them with him to share with nurse today. Patient continues to eat a low sodium diet and stays active by maintaining his yard and by working on old cars routinely.      COMPLETED: Patient will maintain a B/P below 160/90 winthin the next 90 days.       CARE PLAN ENTRY (see longitudinal plan of care for additional care plan information)  Objective:  Last practice recorded BP readings:  BP Readings from Last 3 Encounters:  02/06/20 (!) 162/88  11/07/19 135/62  07/16/19 (!) 160/72   Most recent eGFR/CrCl: No results found for: EGFR  No components found for: CRCL  Current Barriers:  Knowledge deficit related to self care management of hypertension  Case Manager Clinical Goal(s):  Over the next 90 days, patient will verbalize understanding of plan for hypertension management Over the next 90 days, patient will attend all scheduled medical appointments:   Over the next 90 days, patient will demonstrate improved adherence to prescribed treatment plan for hypertension as evidenced by taking all medications as  prescribed, monitoring and recording blood pressure as directed, adhering to low sodium/DASH diet  Interventions:  Evaluation of current treatment plan related to hypertension self management and patient's adherence to plan as established by provider. Reviewed medications with patient and discussed importance of compliance Provided  education regarding s/s of DASH diet/Low salt diet Provided education regarding complications of uncontrolled blood pressure Provided education regarding increasing physical activity  Patient Self Care Activities:  Self administers medications as prescribed Attends all scheduled provider appointments Calls provider office for new concerns, questions, or BP outside discussed parameters Monitors BP and records as discussed Adheres to a low sodium diet/DASH diet Increase physical activity as tolerated  Please see past updates related to this goal by clicking on the "Past Updates" button in the selected goal    Timeframe:  Long-Range Goal Priority:  High Start Date: 06/16/20                            Expected End Date: 03/19/21 Follow Up Date: 01/17/21 Updated: 10/01/20                      Resolved 05/22/10 due to duplicate goals.       Plan: RN Health Coach will send PCP today's assessment note and will call patient within the month of November. Follow-up: Patient agrees to Care Plan and Follow-up.  Emelia Loron RN, BSN [place] [phone] [email or web address]

## 2021-05-19 NOTE — Patient Instructions (Addendum)
Goals Addressed             This Visit's Progress    ([facility]) Monitor and Manage My Blood Sugar-Diabetes Type 2       Timeframe:  Long-Range Goal Priority:  High Start Date: 05/19/21                            Expected End Date: 05/19/22                     Follow Up Date 08/19/21  -Encouraged patient to monitor his blood sugar at home several times a week - check blood sugar at prescribed times - enter blood sugar readings and medication or insulin into daily log - take the blood sugar log to all doctor visits  -Discussed monitoring the amount of sugar and carbohydrates to lower blood sugar -Encouraged patient to continue to be physically active by maintaining his yard and by working on old cars routinely   Why is this important?   Checking your blood sugar at home helps to keep it from getting very high or very low.  Writing the results in a diary or log helps the doctor know how to care for you.  Your blood sugar log should have the time, date and the results.  Also, write down the amount of insulin or other medicine that you take.  Other information, like what you ate, exercise done and how you were feeling, will also be helpful.     Notes: 05/19/21: Patient states that his wife checks his blood sugar periodically at home and the values have been good. Patient feels that his A1c increased to 8.1 due to him eating a lot of sweets. He reports that he has discussed this with his PCP and has modified his diet at home to lower his A1c. His plan is to re-evaluate his A1c in several months to see if his A1c has improved and he will discuss the results with his PCP at that time. Nurse discussed monitoring sugar and carbohydrates. Patient states he has all of the diabetic education he needs currently at home.      [facility]) Track and Manage My Blood Pressure-Hypertension       Timeframe:  Long-Range Goal Priority:  High Start Date:  10/01/20                           Expected End Date: 10/19/20                       Follow Up Date 08/19/21    - check blood pressure 3 times per week - write blood pressure results in a log or diary  -Encouraged patient to continue to stay active -Encouraged patient to continue to eat a low sodium diet  Why is this important?   You won't feel high blood pressure, but it can still hurt your blood vessels.  High blood pressure can cause heart or kidney problems. It can also cause a stroke.  Making lifestyle changes like losing a little weight or eating less salt will help.  Checking your blood pressure at home and at different times of the day can help to control blood pressure.  If the doctor prescribes medicine remember to take it the way the doctor ordered.  Call the office if you cannot afford the medicine or if there are questions about  it.     Notes: 05/19/21: Patient reports taking his blood pressure 2-3 times weekly and recording the values. He states the values are good but did not have them with him to share with nurse today. Patient continues to eat a low sodium diet and stays active by maintaining his yard and by working on old cars routinely.      COMPLETED: Patient will maintain a B/P below 160/90 winthin the next 90 days.       CARE PLAN ENTRY (see longitudinal plan of care for additional care plan information)  Objective:  Last practice recorded BP readings:  BP Readings from Last 3 Encounters:  02/06/20 (!) 162/88  11/07/19 135/62  07/16/19 (!) 160/72   Most recent eGFR/CrCl: No results found for: EGFR  No components found for: CRCL  Current Barriers:  Knowledge deficit related to self care management of hypertension  Case Manager Clinical Goal(s):  Over the next 90 days, patient will verbalize understanding of plan for hypertension management Over the next 90 days, patient will attend all scheduled medical appointments:   Over the next 90 days, patient will demonstrate improved adherence to prescribed treatment plan for  hypertension as evidenced by taking all medications as prescribed, monitoring and recording blood pressure as directed, adhering to low sodium/DASH diet  Interventions:  Evaluation of current treatment plan related to hypertension self management and patient's adherence to plan as established by provider. Reviewed medications with patient and discussed importance of compliance Provided education regarding s/s of DASH diet/Low salt diet Provided education regarding complications of uncontrolled blood pressure Provided education regarding increasing physical activity  Patient Self Care Activities:  Self administers medications as prescribed Attends all scheduled provider appointments Calls provider office for new concerns, questions, or BP outside discussed parameters Monitors BP and records as discussed Adheres to a low sodium diet/DASH diet Increase physical activity as tolerated  Please see past updates related to this goal by clicking on the "Past Updates" button in the selected goal    Timeframe:  Long-Range Goal Priority:  High Start Date: 06/16/20                            Expected End Date: 03/19/21 Follow Up Date: 01/17/21 Updated: 10/01/20                      Resolved 2/33/61 due to duplicate goals.

## 2021-05-20 DIAGNOSIS — N183 Chronic kidney disease, stage 3 unspecified: Secondary | ICD-10-CM | POA: Diagnosis not present

## 2021-05-20 DIAGNOSIS — I129 Hypertensive chronic kidney disease with stage 1 through stage 4 chronic kidney disease, or unspecified chronic kidney disease: Secondary | ICD-10-CM | POA: Diagnosis not present

## 2021-05-20 DIAGNOSIS — M159 Polyosteoarthritis, unspecified: Secondary | ICD-10-CM | POA: Diagnosis not present

## 2021-05-20 DIAGNOSIS — E1122 Type 2 diabetes mellitus with diabetic chronic kidney disease: Secondary | ICD-10-CM | POA: Diagnosis not present

## 2021-06-11 DIAGNOSIS — R7989 Other specified abnormal findings of blood chemistry: Secondary | ICD-10-CM | POA: Diagnosis not present

## 2021-06-11 DIAGNOSIS — N183 Chronic kidney disease, stage 3 unspecified: Secondary | ICD-10-CM | POA: Diagnosis not present

## 2021-06-11 DIAGNOSIS — Z23 Encounter for immunization: Secondary | ICD-10-CM | POA: Diagnosis not present

## 2021-06-11 DIAGNOSIS — D638 Anemia in other chronic diseases classified elsewhere: Secondary | ICD-10-CM | POA: Diagnosis not present

## 2021-06-23 DIAGNOSIS — R7989 Other specified abnormal findings of blood chemistry: Secondary | ICD-10-CM | POA: Diagnosis not present

## 2021-06-23 DIAGNOSIS — E875 Hyperkalemia: Secondary | ICD-10-CM | POA: Diagnosis not present

## 2021-06-24 DIAGNOSIS — E875 Hyperkalemia: Secondary | ICD-10-CM | POA: Diagnosis not present

## 2021-06-24 DIAGNOSIS — N200 Calculus of kidney: Secondary | ICD-10-CM | POA: Diagnosis not present

## 2021-06-24 DIAGNOSIS — R809 Proteinuria, unspecified: Secondary | ICD-10-CM | POA: Diagnosis not present

## 2021-06-24 DIAGNOSIS — E1122 Type 2 diabetes mellitus with diabetic chronic kidney disease: Secondary | ICD-10-CM | POA: Diagnosis not present

## 2021-06-24 DIAGNOSIS — E1129 Type 2 diabetes mellitus with other diabetic kidney complication: Secondary | ICD-10-CM | POA: Diagnosis not present

## 2021-06-24 DIAGNOSIS — N189 Chronic kidney disease, unspecified: Secondary | ICD-10-CM | POA: Diagnosis not present

## 2021-06-24 DIAGNOSIS — I129 Hypertensive chronic kidney disease with stage 1 through stage 4 chronic kidney disease, or unspecified chronic kidney disease: Secondary | ICD-10-CM | POA: Diagnosis not present

## 2021-07-27 ENCOUNTER — Inpatient Hospital Stay (HOSPITAL_COMMUNITY): Payer: Medicare HMO

## 2021-08-04 DIAGNOSIS — I10 Essential (primary) hypertension: Secondary | ICD-10-CM | POA: Diagnosis not present

## 2021-08-04 DIAGNOSIS — N200 Calculus of kidney: Secondary | ICD-10-CM | POA: Diagnosis not present

## 2021-08-04 DIAGNOSIS — Z6829 Body mass index (BMI) 29.0-29.9, adult: Secondary | ICD-10-CM | POA: Diagnosis not present

## 2021-08-04 DIAGNOSIS — D472 Monoclonal gammopathy: Secondary | ICD-10-CM | POA: Diagnosis not present

## 2021-08-04 DIAGNOSIS — E1165 Type 2 diabetes mellitus with hyperglycemia: Secondary | ICD-10-CM | POA: Diagnosis not present

## 2021-08-04 DIAGNOSIS — N183 Chronic kidney disease, stage 3 unspecified: Secondary | ICD-10-CM | POA: Diagnosis not present

## 2021-08-04 DIAGNOSIS — E875 Hyperkalemia: Secondary | ICD-10-CM | POA: Diagnosis not present

## 2021-08-05 ENCOUNTER — Ambulatory Visit: Payer: Medicare HMO | Admitting: Urology

## 2021-08-18 DIAGNOSIS — E875 Hyperkalemia: Secondary | ICD-10-CM | POA: Diagnosis not present

## 2021-08-18 DIAGNOSIS — N183 Chronic kidney disease, stage 3 unspecified: Secondary | ICD-10-CM | POA: Diagnosis not present

## 2021-08-18 DIAGNOSIS — E1165 Type 2 diabetes mellitus with hyperglycemia: Secondary | ICD-10-CM | POA: Diagnosis not present

## 2021-08-18 DIAGNOSIS — N200 Calculus of kidney: Secondary | ICD-10-CM | POA: Diagnosis not present

## 2021-08-18 DIAGNOSIS — I10 Essential (primary) hypertension: Secondary | ICD-10-CM | POA: Diagnosis not present

## 2021-08-18 DIAGNOSIS — D472 Monoclonal gammopathy: Secondary | ICD-10-CM | POA: Diagnosis not present

## 2021-08-19 ENCOUNTER — Other Ambulatory Visit: Payer: Self-pay | Admitting: *Deleted

## 2021-08-19 NOTE — Patient Instructions (Signed)
Visit Information  Thank you for taking time to visit with me today. Please don't hesitate to contact me if I can be of assistance to you before our next scheduled telephone appointment.  Following are the goals we discussed today:   Patient Goals/Self-Care Activities: Take all medications as prescribed Attend all scheduled provider appointments Call provider office for new concerns or questions  keep appointment with eye doctor check blood sugar at prescribed times: at least 3-4 times weekly check feet daily for cuts, sores or redness enter blood sugar readings and medication or insulin into daily log take the blood sugar log to all doctor visits Continue to eat a low sugar and low carbohydrate diet and stay physically active by working on cars and maintaining your yard check blood pressure 3 times per week write blood pressure results in a log or diary take blood pressure log to all doctor appointments report new symptoms to your doctor Continue to eat a low sodium diet and stay physically active   The patient verbalized understanding of instructions, educational materials, and care plan provided today and agreed to receive a mailed copy of patient instructions, educational materials, and care plan.   Telephone follow up appointment with care management team member scheduled DEY:[email or web address]   Emelia Loron RN, [place] [phone] [email or web address]

## 2021-08-19 NOTE — Patient Outreach (Signed)
[facility]) Care Management  08/19/2021  Derek [name] 30-Sep-1945 [id number]  [facility]) Care Management RN Health Coach Note   08/19/2021 Name:  Derek [name] MRN:  [id number] DOB:  [birth year]  Summary: Patient reports he is doing well. He had a PCP visit 08/18/21 and reports that his B/P has been under control. Patient states his A1c was 8.0 and that he was prescribed glipizide which has really worked to lower his blood sugar his last value was 103. Patient explains that he will continue to work towards controlling his hypertension and diabetes. Patient states that his home environment is safe and he is well supported by his wife [name].   Recommendations/Changes made from today's visit: Continue to monitor B/P and blood sugar and record the values Continue to eat a diabetic and low sodium diet Continue to stay physically active by working on cars and doing yard work  Subjective: Derek [name] is an 75 y.o. year old male who is a primary patient of Chatsworth Nation, MD. The care management team was consulted for assistance with care management and/or care coordination needs.    RN Health Coach completed Telephone Visit today.   Objective:  Medications Reviewed Today     Reviewed by Michiel Cowboy, RN (Registered Nurse) on 08/19/21 at 51  Med List Status: <None>   Medication Order Taking? Sig Documenting Provider Last Dose Status Informant  amLODipine (NORVASC) 2.5 MG tablet [id number] Yes Take 2.5 mg by mouth daily. [provider] Taking Active   aspirin EC 81 MG tablet [id number] Yes Take 81 mg by mouth daily. [provider] Taking Active Spouse/Significant Other           Med Note Ivor Reining May 31, 2019 10:43 AM)    chlorthalidone (HYGROTON) 25 MG tablet [id number] Yes Take by mouth. [provider] Taking Active   Histamine Dihydrochloride (AUSTRALIAN DREAM ARTHRITIS) 0.025 % CREA  [id number] Yes Apply 1 application topically daily as needed (pain). [provider] Taking Active Spouse/Significant Other  Lidocaine HCl 4 % CREA [id number] Yes Apply 1 application topically daily as needed (pain). [provider] Taking Active Spouse/Significant Other  metoprolol tartrate (LOPRESSOR) 50 MG tablet [id number] Yes Take 25 mg by mouth 2 (two) times daily. [provider] Taking Active   nitroGLYCERIN (NITROSTAT) 0.4 MG SL tablet [id number]  Place 1 tablet (0.4 mg total) under the tongue every 5 (five) minutes as needed for chest pain. [name], MD  Expired 10/14/19 2359   ondansetron (ZOFRAN) 4 MG tablet [id number] Yes Take 1 tablet (4 mg total) by mouth every 8 (eight) hours as needed for nausea or vomiting. [name], [name], MD Taking Active   oxyCODONE-acetaminophen (PERCOCET) 5-325 MG tablet [id number] No Take 1 tablet by mouth every 4 (four) hours as needed.  Patient not taking: Reported on 05/19/2021   Cleon Gustin, MD Not Taking Active            Med Note [name], Makenli Derstine A   Wed Aug 19, 2021  2:18 PM) completed  pravastatin (PRAVACHOL) 40 MG tablet [id number] Yes Take 40 mg by mouth daily. [provider] Taking Active Spouse/Significant Other           Med Note Ivor Reining May 31, 2019 10:43 AM)    sildenafil (VIAGRA) 100 MG tablet [id number] Yes Take 1 tablet (100 mg total) by mouth as needed for erectile dysfunction.  [name], [name], MD Taking Active   tamsulosin [facility]) 0.4 MG CAPS capsule [id number] Yes Take 0.4 mg by mouth daily.  [provider] Taking Active   traMADol (ULTRAM) 50 MG tablet [id number] Yes Take 50 mg by mouth 2 (two) times daily.  [provider] Taking Active Spouse/Significant Other           Med Note [name], Ahaan Zobrist A   Tue May 19, 2021 10:53 AM) Patient reports taking very infrequently             SDOH:  (Social Determinants of Health) assessments and interventions  performed: SDOH assessments completed today and documented in the Epic system.    Care Plan  Review of patient past medical history, allergies, medications, health status, including review of consultants reports, laboratory and other test data, was performed as part of comprehensive evaluation for care management services.   Care Plan : Hypertension (Adult)  Updates made by Michiel Cowboy, RN since 08/19/2021 12:00 AM     Problem: Hypertension (Hypertension) Resolved 08/19/2021  Priority: High     Long-Range Goal: Hypertension Monitored Completed 08/19/2021  Start Date: 10/01/2020  Expected End Date: 10/19/2021  Note:   Resolving due to duplicate goal  Evidence-based guidance:  Promote initial use of ambulatory blood pressure measurements (for 3 days) to rule out "white-coat" effect; identify masked hypertension and presence or absence of nocturnal "dipping" of blood pressure.   Encourage continued use of home blood pressure monitoring and recording in blood pressure log; include symptoms of hypotension or potential medication side effects in log.  Review blood pressure measurements taken inside and outside of the provider office; establish baseline and monitor trends; compare to target ranges or patient goal.  Share overall cardiovascular risk with patient; encourage changes to lifestyle risk factors, including alcohol consumption, smoking, inadequate exercise, poor dietary habits and stress.   Notes:     Task: Identify and Monitor Blood Pressure Elevation Completed 08/19/2021  Due Date: 10/19/2021  Note:   Care Management Activities:    - blood pressure equipment and technique reviewed - home or ambulatory blood pressure monitoring encouraged    Notes:     Problem: Disease Progression (Hypertension) Resolved 08/19/2021  Priority: High     Long-Range Goal: Disease Progression Prevented or Minimized Completed 08/19/2021  Start Date: 10/01/2020  Expected End Date: 10/19/2021   Note:   Resolving due to duplicate goal  Evidence-based guidance:  Tailor lifestyle advice to individual; review progress regularly; give frequent encouragement and respond positively to incremental successes.  Assess for and promote awareness of worsening disease or development of comorbidity.  Prepare patient for laboratory and diagnostic exams based on risk and presentation.  Prepare patient for use of pharmacologic therapy that may include diuretic, beta-blocker, beta-blocker/thiazide combination, angiotensin-converting enzyme inhibitor, renin-angiotensin blocker or calcium-channel blocker.  Expect periodic adjustments to pharmacologic therapy; manage side effects.  Promote a healthy diet that includes primarily plant-based foods, such as fruits, vegetables, whole grains, beans and legumes, low-fat dairy and lean meats.   Consider moderate reduction in sodium intake by avoiding the addition of salt to prepared foods and limiting processed meats, canned soup, frozen meals and salty snacks.   Promote a regular, daily exercise goal of 150 minutes per week of moderate exercise based on tolerance, ability and patient choice; consider referral to physical therapist, community wellness and/or activity program.  Encourage the avoidance of no more than 2 hours per day of sedentary activity, such as recreational screen time.  Review sources of stress; explore current coping strategies and encourage use of mindfulness, yoga, meditation or exercise to manage stress.   Notes:     Task: Alleviate Barriers to Hypertension Treatment Completed 08/19/2021  Due Date: 10/19/2021  Note:   Care Management Activities:    - difficulty of making life-long changes acknowledged - healthy diet promoted - healthy family lifestyle promoted - medical nutrition therapy provided - patient response to treatment assessed - reduction of dietary sodium encouraged - reduction in sedentary activities encouraged     Notes:     Care Plan : Diabetes Type 2 (Adult)  Updates made by Michiel Cowboy, RN since 08/19/2021 12:00 AM     Problem: Glycemic Management (Diabetes, Type 2) Resolved 08/19/2021  Priority: High     Long-Range Goal: Glycemic Management Optimized Completed 08/19/2021  Start Date: 05/19/2021  Expected End Date: 05/19/2022  Note:   Resolving due to duplicate goal  Evidence-based guidance:  Anticipate A1C testing (point-of-care) every 3 to 6 months based on goal attainment.  Review mutually-set A1C goal or target range.  Anticipate use of antihyperglycemic with or without insulin and periodic adjustments; consider active involvement of pharmacist.  Provide medical nutrition therapy and development of individualized eating.  Compare self-reported symptoms of hypo or hyperglycemia to blood glucose levels, diet and fluid intake, current medications, psychosocial and physiologic stressors, change in activity and barriers to care adherence.  Promote self-monitoring of blood glucose levels.  Assess and address barriers to management plan, such as food insecurity, age, developmental ability, depression, anxiety, fear of hypoglycemia or weight gain, as well as medication cost, side effects and complicated regimen.  Consider referral to community-based diabetes education program, visiting nurse, community health worker or health coach.  Encourage regular dental care for treatment of periodontal disease; refer to dental provider when needed.   Notes:     Task: Alleviate Barriers to Glycemic Management Completed 08/19/2021  Due Date: 05/19/2022  Note:   Care Management Activities:    - blood glucose monitoring encouraged - dental care encouraged - individualized medical nutrition therapy provided - use of blood glucose monitoring log promoted    Notes:    Problem: Disease Progression (Diabetes, Type 2) Resolved 08/19/2021     Long-Range Goal: Disease Progression Prevented or Minimized  Completed 08/19/2021  Start Date: 05/19/2021  Expected End Date: 05/19/2022  Priority: High  Note:   Resolving due to duplicate goal  Evidence-based guidance:  Prepare patient for laboratory and diagnostic exams based on risk and presentation.  Encourage lifestyle changes, such as increased intake of plant-based foods, stress reduction, consistent physical activity and smoking cessation to prevent long-term complications and chronic disease.   Individualize activity and exercise recommendations while considering potential limitations, such as neuropathy, retinopathy or the ability to prevent hyperglycemia or hypoglycemia.   Prepare patient for use of pharmacologic therapy that may include antihypertensive, analgesic, prostaglandin E1 with periodic adjustments, based on presenting chronic condition and laboratory results.  Assess signs/symptoms and risk factors for hypertension, sleep-disordered breathing, neuropathy (including changes in gait and balance), retinopathy, nephropathy and sexual dysfunction.  Address pregnancy planning and contraceptive choice, especially when prescribing antihypertensive or statin.  Ensure completion of annual comprehensive foot exam and dilated eye exam.   Implement additional individualized goals and interventions based on identified risk factors.  Prepare patient for consultation or referral for specialist care, such as ophthalmology, neurology, cardiology, podiatry, nephrology or perinatology.   Notes:     Task: Monitor and Manage Follow-up for  Comorbidities Completed 08/19/2021  Due Date: 05/19/2022  Note:   Care Management Activities:    - activity based on tolerance and functional limitations encouraged - completion of annual dilated eye exam confirmed - healthy lifestyle promoted - reduction of sedentary activity encouraged    Notes:     Care Plan : [place] of Care  Updates made by Michiel Cowboy, RN since 08/19/2021 12:00 AM      Problem: Knowledge Deficit Related to Hypertension and Diabetes   Priority: High     Long-Range Goal: Development of Plan of Care for Management of Hypertension and Diabetes   Start Date: 08/19/2021  Expected End Date: 08/19/2022  Priority: High  Note:   Current Barriers:  Knowledge Deficits related to plan of care for management of HTN and DMII   RNCM Clinical Goal(s):  Patient will verbalize understanding of plan for management of HTN and DMII as evidenced by continuation of monitoring B/P and blood sugar, adhering to a low sodium and diabetic diet, staying physically active by working on cars and maintain his yard, maintaining A1c 8 or below and B/P below 150/90 take all medications exactly as prescribed and will call provider for medication related questions as evidenced by patient's verbalization that he takes his prescribed  medications as written and contact his providers for any questions or concerns. continue to work with RN Care Manager to address care management and care coordination needs related to  HTN and DMII as evidenced by adherence to CM Team Scheduled appointments through collaboration with RN Care manager, provider, and care team.   Interventions: Inter-disciplinary care team collaboration (see longitudinal plan of care) Evaluation of current treatment plan related to  self management and patient's adherence to plan as established by provider   Diabetes Interventions:  (Status:  Goal on track:  Yes.) Long Term Goal Assessed patient's understanding of A1c goal:  patient reports he will discuss with PCP Provided education to patient about basic DM disease process Reviewed medications with patient and discussed importance of medication adherence Discussed plans with patient for ongoing care management follow up and provided patient with direct contact information for care management team Advised patient, providing education and rationale, to check cbg patient takes  blood sugar 2-3 times weekly and record, calling provider for findings outside established parameters Discussed continuation of adhering to low sugar and low carbohydrate diet Discussed maintaining physical activity by continuation of working on cars daily and doing his yard work Marine scientist will send patient diabetes education Lab Results  Component Value Date   HGBA1C 6.6 (H) 05/16/2019   Hypertension Interventions:  (Status:  Goal on track:  Yes.) Long Term Goal Last practice recorded BP readings:  BP Readings from Last 3 Encounters:  01/06/21 136/77  11/04/20 (!) 148/78  10/23/20 (!) 156/70  Most recent eGFR/CrCl: No results found for: EGFR  No components found for: CRCL  Evaluation of current treatment plan related to hypertension self management and patient's adherence to plan as established by provider Provided education to patient re: stroke prevention, s/s of heart attack and stroke Reviewed medications with patient and discussed importance of compliance Discussed plans with patient for ongoing care management follow up and provided patient with direct contact information for care management team Provided education on prescribed diet low sodium Discussed complications of poorly controlled blood pressure such as heart disease, stroke, circulatory complications, vision complications, kidney impairment, sexual dysfunction Discussed continuation of monitoring B/P 2-3 times weekly, recording the values, and contacting  provider for issues and concerns Nurse will send patient hypertension education  Patient Goals/Self-Care Activities: Take all medications as prescribed Attend all scheduled provider appointments Call provider office for new concerns or questions  keep appointment with eye doctor check blood sugar at prescribed times: at least 3-4 times weekly check feet daily for cuts, sores or redness enter blood sugar readings and medication or insulin into daily log take the blood  sugar log to all doctor visits Continue to eat a low sugar and low carbohydrate diet and stay physically active by working on cars and maintaining your yard check blood pressure 3 times per week write blood pressure results in a log or diary take blood pressure log to all doctor appointments report new symptoms to your doctor Continue to eat a low sodium diet and stay physically active  Follow Up Plan:  Telephone follow up appointment with care management team member scheduled for:  February       Plan: Telephone follow up appointment with care management team member scheduled for:  February. Nurse will send PCP a quarterly report  Emelia Loron RN, [place] [phone] [email or web address]

## 2021-08-27 DIAGNOSIS — E1165 Type 2 diabetes mellitus with hyperglycemia: Secondary | ICD-10-CM | POA: Diagnosis not present

## 2021-08-27 DIAGNOSIS — R5383 Other fatigue: Secondary | ICD-10-CM | POA: Diagnosis not present

## 2021-08-27 DIAGNOSIS — R7989 Other specified abnormal findings of blood chemistry: Secondary | ICD-10-CM | POA: Diagnosis not present

## 2021-08-27 DIAGNOSIS — E1129 Type 2 diabetes mellitus with other diabetic kidney complication: Secondary | ICD-10-CM | POA: Diagnosis not present

## 2021-08-27 DIAGNOSIS — E1122 Type 2 diabetes mellitus with diabetic chronic kidney disease: Secondary | ICD-10-CM | POA: Diagnosis not present

## 2021-08-27 DIAGNOSIS — I10 Essential (primary) hypertension: Secondary | ICD-10-CM | POA: Diagnosis not present

## 2021-08-27 DIAGNOSIS — D638 Anemia in other chronic diseases classified elsewhere: Secondary | ICD-10-CM | POA: Diagnosis not present

## 2021-08-27 DIAGNOSIS — E559 Vitamin D deficiency, unspecified: Secondary | ICD-10-CM | POA: Diagnosis not present

## 2021-08-27 DIAGNOSIS — K219 Gastro-esophageal reflux disease without esophagitis: Secondary | ICD-10-CM | POA: Diagnosis not present

## 2021-08-27 DIAGNOSIS — I129 Hypertensive chronic kidney disease with stage 1 through stage 4 chronic kidney disease, or unspecified chronic kidney disease: Secondary | ICD-10-CM | POA: Diagnosis not present

## 2021-08-27 DIAGNOSIS — N189 Chronic kidney disease, unspecified: Secondary | ICD-10-CM | POA: Diagnosis not present

## 2021-09-03 DIAGNOSIS — E875 Hyperkalemia: Secondary | ICD-10-CM | POA: Diagnosis not present

## 2021-09-03 DIAGNOSIS — E1122 Type 2 diabetes mellitus with diabetic chronic kidney disease: Secondary | ICD-10-CM | POA: Diagnosis not present

## 2021-09-03 DIAGNOSIS — N170 Acute kidney failure with tubular necrosis: Secondary | ICD-10-CM | POA: Diagnosis not present

## 2021-09-03 DIAGNOSIS — I129 Hypertensive chronic kidney disease with stage 1 through stage 4 chronic kidney disease, or unspecified chronic kidney disease: Secondary | ICD-10-CM | POA: Diagnosis not present

## 2021-09-03 DIAGNOSIS — D638 Anemia in other chronic diseases classified elsewhere: Secondary | ICD-10-CM | POA: Diagnosis not present

## 2021-09-03 DIAGNOSIS — N189 Chronic kidney disease, unspecified: Secondary | ICD-10-CM | POA: Diagnosis not present

## 2021-09-04 DIAGNOSIS — N189 Chronic kidney disease, unspecified: Secondary | ICD-10-CM | POA: Diagnosis not present

## 2021-09-04 DIAGNOSIS — E1122 Type 2 diabetes mellitus with diabetic chronic kidney disease: Secondary | ICD-10-CM | POA: Diagnosis not present

## 2021-09-04 DIAGNOSIS — I10 Essential (primary) hypertension: Secondary | ICD-10-CM | POA: Diagnosis not present

## 2021-09-08 DIAGNOSIS — N170 Acute kidney failure with tubular necrosis: Secondary | ICD-10-CM | POA: Diagnosis not present

## 2021-09-08 DIAGNOSIS — I129 Hypertensive chronic kidney disease with stage 1 through stage 4 chronic kidney disease, or unspecified chronic kidney disease: Secondary | ICD-10-CM | POA: Diagnosis not present

## 2021-09-08 DIAGNOSIS — N179 Acute kidney failure, unspecified: Secondary | ICD-10-CM | POA: Diagnosis not present

## 2021-09-08 DIAGNOSIS — E875 Hyperkalemia: Secondary | ICD-10-CM | POA: Diagnosis not present

## 2021-09-08 DIAGNOSIS — E1122 Type 2 diabetes mellitus with diabetic chronic kidney disease: Secondary | ICD-10-CM | POA: Diagnosis not present

## 2021-09-08 DIAGNOSIS — N200 Calculus of kidney: Secondary | ICD-10-CM | POA: Diagnosis not present

## 2021-09-08 DIAGNOSIS — D638 Anemia in other chronic diseases classified elsewhere: Secondary | ICD-10-CM | POA: Diagnosis not present

## 2021-09-08 DIAGNOSIS — N189 Chronic kidney disease, unspecified: Secondary | ICD-10-CM | POA: Diagnosis not present

## 2021-09-09 DIAGNOSIS — I10 Essential (primary) hypertension: Secondary | ICD-10-CM | POA: Diagnosis not present

## 2021-09-09 DIAGNOSIS — E1122 Type 2 diabetes mellitus with diabetic chronic kidney disease: Secondary | ICD-10-CM | POA: Diagnosis not present

## 2021-09-09 DIAGNOSIS — R5383 Other fatigue: Secondary | ICD-10-CM | POA: Diagnosis not present

## 2021-09-09 DIAGNOSIS — N189 Chronic kidney disease, unspecified: Secondary | ICD-10-CM | POA: Diagnosis not present

## 2021-09-18 DIAGNOSIS — N170 Acute kidney failure with tubular necrosis: Secondary | ICD-10-CM | POA: Diagnosis not present

## 2021-09-18 DIAGNOSIS — E1122 Type 2 diabetes mellitus with diabetic chronic kidney disease: Secondary | ICD-10-CM | POA: Diagnosis not present

## 2021-09-18 DIAGNOSIS — I129 Hypertensive chronic kidney disease with stage 1 through stage 4 chronic kidney disease, or unspecified chronic kidney disease: Secondary | ICD-10-CM | POA: Diagnosis not present

## 2021-09-18 DIAGNOSIS — E875 Hyperkalemia: Secondary | ICD-10-CM | POA: Diagnosis not present

## 2021-09-18 DIAGNOSIS — N189 Chronic kidney disease, unspecified: Secondary | ICD-10-CM | POA: Diagnosis not present

## 2021-09-18 DIAGNOSIS — D472 Monoclonal gammopathy: Secondary | ICD-10-CM | POA: Diagnosis not present

## 2021-09-18 DIAGNOSIS — D638 Anemia in other chronic diseases classified elsewhere: Secondary | ICD-10-CM | POA: Diagnosis not present

## 2021-09-23 DIAGNOSIS — Z6829 Body mass index (BMI) 29.0-29.9, adult: Secondary | ICD-10-CM | POA: Diagnosis not present

## 2021-09-23 DIAGNOSIS — J329 Chronic sinusitis, unspecified: Secondary | ICD-10-CM | POA: Diagnosis not present

## 2021-10-02 DIAGNOSIS — N170 Acute kidney failure with tubular necrosis: Secondary | ICD-10-CM | POA: Diagnosis not present

## 2021-10-20 DIAGNOSIS — I10 Essential (primary) hypertension: Secondary | ICD-10-CM | POA: Diagnosis not present

## 2021-10-20 DIAGNOSIS — Z1331 Encounter for screening for depression: Secondary | ICD-10-CM | POA: Diagnosis not present

## 2021-10-20 DIAGNOSIS — D472 Monoclonal gammopathy: Secondary | ICD-10-CM | POA: Diagnosis not present

## 2021-10-20 DIAGNOSIS — E875 Hyperkalemia: Secondary | ICD-10-CM | POA: Diagnosis not present

## 2021-10-20 DIAGNOSIS — N183 Chronic kidney disease, stage 3 unspecified: Secondary | ICD-10-CM | POA: Diagnosis not present

## 2021-10-20 DIAGNOSIS — E1165 Type 2 diabetes mellitus with hyperglycemia: Secondary | ICD-10-CM | POA: Diagnosis not present

## 2021-10-20 DIAGNOSIS — N200 Calculus of kidney: Secondary | ICD-10-CM | POA: Diagnosis not present

## 2021-10-20 DIAGNOSIS — Z1389 Encounter for screening for other disorder: Secondary | ICD-10-CM | POA: Diagnosis not present

## 2021-10-28 DIAGNOSIS — N183 Chronic kidney disease, stage 3 unspecified: Secondary | ICD-10-CM | POA: Diagnosis not present

## 2021-10-28 DIAGNOSIS — R7989 Other specified abnormal findings of blood chemistry: Secondary | ICD-10-CM | POA: Diagnosis not present

## 2021-10-28 DIAGNOSIS — E1129 Type 2 diabetes mellitus with other diabetic kidney complication: Secondary | ICD-10-CM | POA: Diagnosis not present

## 2021-10-28 DIAGNOSIS — E1165 Type 2 diabetes mellitus with hyperglycemia: Secondary | ICD-10-CM | POA: Diagnosis not present

## 2021-10-28 DIAGNOSIS — E1122 Type 2 diabetes mellitus with diabetic chronic kidney disease: Secondary | ICD-10-CM | POA: Diagnosis not present

## 2021-10-28 DIAGNOSIS — Z1329 Encounter for screening for other suspected endocrine disorder: Secondary | ICD-10-CM | POA: Diagnosis not present

## 2021-10-28 DIAGNOSIS — E785 Hyperlipidemia, unspecified: Secondary | ICD-10-CM | POA: Diagnosis not present

## 2021-11-04 DIAGNOSIS — N183 Chronic kidney disease, stage 3 unspecified: Secondary | ICD-10-CM | POA: Diagnosis not present

## 2021-11-04 DIAGNOSIS — E1165 Type 2 diabetes mellitus with hyperglycemia: Secondary | ICD-10-CM | POA: Diagnosis not present

## 2021-11-04 DIAGNOSIS — I10 Essential (primary) hypertension: Secondary | ICD-10-CM | POA: Diagnosis not present

## 2021-11-04 DIAGNOSIS — N200 Calculus of kidney: Secondary | ICD-10-CM | POA: Diagnosis not present

## 2021-11-04 DIAGNOSIS — D472 Monoclonal gammopathy: Secondary | ICD-10-CM | POA: Diagnosis not present

## 2021-11-04 DIAGNOSIS — Z6829 Body mass index (BMI) 29.0-29.9, adult: Secondary | ICD-10-CM | POA: Diagnosis not present

## 2021-11-04 DIAGNOSIS — E875 Hyperkalemia: Secondary | ICD-10-CM | POA: Diagnosis not present

## 2021-11-12 DIAGNOSIS — B379 Candidiasis, unspecified: Secondary | ICD-10-CM | POA: Diagnosis not present

## 2021-11-12 DIAGNOSIS — N170 Acute kidney failure with tubular necrosis: Secondary | ICD-10-CM | POA: Diagnosis not present

## 2021-11-12 DIAGNOSIS — E1122 Type 2 diabetes mellitus with diabetic chronic kidney disease: Secondary | ICD-10-CM | POA: Diagnosis not present

## 2021-11-12 DIAGNOSIS — E875 Hyperkalemia: Secondary | ICD-10-CM | POA: Diagnosis not present

## 2021-11-12 DIAGNOSIS — N189 Chronic kidney disease, unspecified: Secondary | ICD-10-CM | POA: Diagnosis not present

## 2021-11-13 ENCOUNTER — Other Ambulatory Visit: Payer: Self-pay | Admitting: *Deleted

## 2021-11-13 NOTE — Patient Outreach (Signed)
[facility]) Care Management  11/13/2021  [name] 01-30-1946 [id number]  Unsuccessful outreach attempt made to patient. Patient answered the phone and stated that he would not be able to speak today. Patient did request that this nurse call back at a later date.   Plan: RN Health Coach will call patient within the month of march.  Derek Loron RN, BSN [place] [phone] [name]

## 2021-11-18 DIAGNOSIS — E1165 Type 2 diabetes mellitus with hyperglycemia: Secondary | ICD-10-CM | POA: Diagnosis not present

## 2021-11-18 DIAGNOSIS — I10 Essential (primary) hypertension: Secondary | ICD-10-CM | POA: Diagnosis not present

## 2021-11-18 DIAGNOSIS — E875 Hyperkalemia: Secondary | ICD-10-CM | POA: Diagnosis not present

## 2021-11-18 DIAGNOSIS — N183 Chronic kidney disease, stage 3 unspecified: Secondary | ICD-10-CM | POA: Diagnosis not present

## 2021-11-18 DIAGNOSIS — D472 Monoclonal gammopathy: Secondary | ICD-10-CM | POA: Diagnosis not present

## 2021-11-18 DIAGNOSIS — N200 Calculus of kidney: Secondary | ICD-10-CM | POA: Diagnosis not present

## 2021-11-18 DIAGNOSIS — Z6829 Body mass index (BMI) 29.0-29.9, adult: Secondary | ICD-10-CM | POA: Diagnosis not present

## 2021-11-19 DIAGNOSIS — E1122 Type 2 diabetes mellitus with diabetic chronic kidney disease: Secondary | ICD-10-CM | POA: Diagnosis not present

## 2021-11-19 DIAGNOSIS — N189 Chronic kidney disease, unspecified: Secondary | ICD-10-CM | POA: Diagnosis not present

## 2021-11-19 DIAGNOSIS — I129 Hypertensive chronic kidney disease with stage 1 through stage 4 chronic kidney disease, or unspecified chronic kidney disease: Secondary | ICD-10-CM | POA: Diagnosis not present

## 2021-11-19 DIAGNOSIS — D472 Monoclonal gammopathy: Secondary | ICD-10-CM | POA: Diagnosis not present

## 2021-11-19 DIAGNOSIS — N200 Calculus of kidney: Secondary | ICD-10-CM | POA: Diagnosis not present

## 2021-11-19 DIAGNOSIS — E875 Hyperkalemia: Secondary | ICD-10-CM | POA: Diagnosis not present

## 2021-11-26 DIAGNOSIS — G8929 Other chronic pain: Secondary | ICD-10-CM | POA: Diagnosis not present

## 2021-11-26 DIAGNOSIS — E1122 Type 2 diabetes mellitus with diabetic chronic kidney disease: Secondary | ICD-10-CM | POA: Diagnosis not present

## 2021-11-26 DIAGNOSIS — E1151 Type 2 diabetes mellitus with diabetic peripheral angiopathy without gangrene: Secondary | ICD-10-CM | POA: Diagnosis not present

## 2021-11-26 DIAGNOSIS — M1990 Unspecified osteoarthritis, unspecified site: Secondary | ICD-10-CM | POA: Diagnosis not present

## 2021-11-26 DIAGNOSIS — Z008 Encounter for other general examination: Secondary | ICD-10-CM | POA: Diagnosis not present

## 2021-11-26 DIAGNOSIS — I252 Old myocardial infarction: Secondary | ICD-10-CM | POA: Diagnosis not present

## 2021-11-26 DIAGNOSIS — Z7984 Long term (current) use of oral hypoglycemic drugs: Secondary | ICD-10-CM | POA: Diagnosis not present

## 2021-11-26 DIAGNOSIS — E1165 Type 2 diabetes mellitus with hyperglycemia: Secondary | ICD-10-CM | POA: Diagnosis not present

## 2021-11-26 DIAGNOSIS — N189 Chronic kidney disease, unspecified: Secondary | ICD-10-CM | POA: Diagnosis not present

## 2021-11-26 DIAGNOSIS — E785 Hyperlipidemia, unspecified: Secondary | ICD-10-CM | POA: Diagnosis not present

## 2021-11-26 DIAGNOSIS — I2510 Atherosclerotic heart disease of native coronary artery without angina pectoris: Secondary | ICD-10-CM | POA: Diagnosis not present

## 2021-11-26 DIAGNOSIS — I129 Hypertensive chronic kidney disease with stage 1 through stage 4 chronic kidney disease, or unspecified chronic kidney disease: Secondary | ICD-10-CM | POA: Diagnosis not present

## 2021-11-26 DIAGNOSIS — N529 Male erectile dysfunction, unspecified: Secondary | ICD-10-CM | POA: Diagnosis not present

## 2021-12-01 ENCOUNTER — Other Ambulatory Visit: Payer: Self-pay | Admitting: *Deleted

## 2021-12-01 NOTE — Patient Instructions (Signed)
Visit Information ? ?Thank you for taking time to visit with me today. Please don't hesitate to contact me if I can be of assistance to you before our next scheduled telephone appointment. ? ?Following are the goals we discussed today:  ?Take all medications as prescribed ?Attend all scheduled provider appointments ?Call provider office for new concerns or questions  ?keep appointment with eye doctor ?check blood sugar at prescribed times:  3-4 times weekly ?check feet daily for cuts, sores or redness ?enter blood sugar readings and medication or insulin into daily log ?take the blood sugar log to all doctor visits ?Continue to eat a low sugar and low carbohydrate diet and stay physically active by working on cars and maintaining your yard ?check blood pressure 3 times per week ?write blood pressure results in a log or diary ?take blood pressure log to all doctor appointments ?Continue to eat a low sodium diet and stay physically active ? ? ?The patient verbalized understanding of instructions, educational materials, and care plan provided today and agreed to receive a mailed copy of patient instructions, educational materials, and care plan.  ? ?Telephone follow up appointment with care management team member scheduled for: ? ?Emelia Loron RN, BSN ?Nurse Health Coach ?[place] ?[phone] ?[email or web address] ? ? ?  ?

## 2021-12-01 NOTE — Patient Outreach (Signed)
[facility]) Care Management ? ?12/01/2021 ? ?[place] ?1945/11/28 ?[id number] ? ?[facility]) Care Management ?RN Health Coach Note ? ? ?12/01/2021 ?Name:  Derek [name] MRN:  [id number] DOB:  Feb 25, 1946 ? ?Summary: ?Patient states he is feeling well. He reports having recent visits with his providers and explains that his renal function, hypertension, and diabetes are in better control. 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 Patient states his home environment is safe and he is well supported by his wife. Patient did not have any further questions or concerns today and did confirm that he has this nurse's contact number to call her if needed.  ? ?Recommendations/Changes made from today's visit: ?Continue to monitor B/P and blood sugar and record the values ?Continue to eat a diabetic and low sodium diet ?Continue to stay physically active by working on cars and doing yard work ? ?Subjective: ?Derek [name] is an 76 y.o. year old male who is a primary patient of [place] Nation, MD. The care management team was consulted for assistance with care management and/or care coordination needs.   ? ?RN Health Coach completed Telephone Visit today.  ? ?Objective: ? ?Medications Reviewed Today   ? ? Reviewed by Michiel Cowboy, RN (Registered Nurse) on 12/01/21 at 1027  Med List Status: <None>  ? ?Medication Order Taking? Sig Documenting Provider Last Dose Status Informant  ?amLODipine (NORVASC) 2.5 MG tablet [id number] Yes Take 2.5 mg by mouth daily. [provider] Taking Active   ?aspirin EC 81 MG tablet [id number] Yes Take 81 mg by mouth daily. [provider] Taking Active Spouse/Significant Other  ?         ?Med Note Ivor Reining May 31, 2019 10:43 AM)    ?chlorthalidone (HYGROTON) 25 MG tablet [id number]  Take by mouth. [provider]  Expired  11/10/21 2359   ?glipiZIDE (GLUCOTROL) 5 MG tablet [id number] Yes Take 2.5 mg by mouth every morning. [provider] Taking Active   ?Histamine Dihydrochloride (AUSTRALIAN DREAM ARTHRITIS) 0.025 % CREA [id number] Yes Apply 1 application topically daily as needed (pain). [provider] Taking Active Spouse/Significant Other  ?Lidocaine HCl 4 % CREA [id number] Yes Apply 1 application topically daily as needed (pain). [provider] Taking Active Spouse/Significant Other  ?metoprolol tartrate (LOPRESSOR) 50 MG tablet [id number] Yes Take 25 mg by mouth 2 (two) times daily. [provider] Taking Active   ?nitroGLYCERIN (NITROSTAT) 0.4 MG SL tablet [id number]  Place 1 tablet (0.4 mg total) under the tongue every 5 (five) minutes as needed for chest pain. Derek Sark, MD  Expired 10/14/19 2359   ?ondansetron (ZOFRAN) 4 MG tablet [id number] Yes Take 1 tablet (4 mg total) by mouth every 8 (eight) hours as needed for nausea or vomiting. [name], Derek Furbish, MD Taking Active   ?oxyCODONE-acetaminophen (PERCOCET) 5-325 MG tablet [id number] No Take 1 tablet by mouth every 4 (four) hours as needed.  ?Patient not taking: Reported on 05/19/2021  ? [name], Derek Furbish, MD Not Taking Active   ?         ?Med Note Derek [name], Derek [name] A   Wed Aug 19, 2021  2:18 PM) completed  ?pravastatin (PRAVACHOL) 40 MG tablet [id number] Yes Take 40 mg by mouth daily. [provider] Taking Active Spouse/Significant Other  ?         ?  Med Note Ivor Reining May 31, 2019 10:43 AM)    ?sildenafil (VIAGRA) 100 MG tablet [id number] Yes Take 1 tablet (100 mg total) by mouth as needed for erectile dysfunction. [name], Derek Furbish, MD Taking Active   ?tamsulosin [facility]) 0.4 MG CAPS capsule [id number] Yes Take 0.4 mg by mouth daily.  [provider] Taking Active   ?traMADol (ULTRAM) 50 MG tablet [id number] Yes Take 50 mg by mouth 2 (two) times daily.  [provider] Taking Active  Spouse/Significant Other  ?         ?Med Note Derek [name], Derek [name] A   Tue May 19, 2021 10:53 AM) Patient reports taking very infrequently  ? ?  ?  ? ?  ? ? ? ?SDOH:  (Social Determinants of Health) assessments and interventions performed:  ? ? ?Care Plan ? ?Review of patient past medical history, allergies, medications, health status, including review of consultants reports, laboratory and other test data, was performed as part of comprehensive evaluation for care management services.  ? ?Care Plan : RN Care Manager Plan of Care  ?Updates made by Michiel Cowboy, RN since 12/01/2021 12:00 AM  ?  ? ?Problem: Knowledge Deficit Related to Hypertension and Diabetes   ?Priority: High  ?  ? ?Long-Range Goal: Development of Plan of Care for Management of Hypertension and Diabetes   ?Start Date: 08/19/2021  ?Expected End Date: 08/19/2022  ?Priority: High  ?Note:   ?Current Barriers:  ?Knowledge Deficits related to plan of care for management of HTN and DMII  ? ?RNCM Clinical Goal(s):  ?Patient will verbalize understanding of plan for management of HTN and DMII as evidenced by continuation of monitoring B/P and blood sugar, adhering to a low sodium and diabetic diet, staying physically active by working on cars and maintain his yard, maintaining A1c 7 or below and B/P below 150/90 ?take all medications exactly as prescribed and will call provider for medication related questions as evidenced by patient's verbalization that he takes his prescribed  medications as written and contact his providers for any questions or concerns. ?continue to work with RN Care Manager to address care management and care coordination needs related to  HTN and DMII as evidenced by adherence to CM Team Scheduled appointments through collaboration with RN Care manager, provider, and care team.  ? ?Interventions: ?Inter-disciplinary care team collaboration (see longitudinal plan of care) ?Evaluation of current treatment plan related to  self management and  patient's adherence to plan as established by provider ? ? ?Diabetes Interventions:  (Status:  Goal on track:  Yes.) Long Term Goal ?Assessed patient's understanding of A1c goal:  decreased A1c from 8 to 7  ?Provided education to patient about basic DM disease process ?Reviewed medications with patient and discussed importance of medication adherence ?Discussed plans with patient for ongoing care management follow up and provided patient with direct contact information for care management team ?Advised patient, providing education and rationale, to check cbg patient takes blood sugar 2-3 times weekly and record, calling provider for findings outside established parameters ?Discussed continuation of adhering to low sugar and low carbohydrate diet ?Discussed maintaining physical activity by continuation of working on cars daily and doing his yard work ?Nurse previously sent patient diabetes education ?Lab Results  ?Component Value Date  ? HGBA1C 6.6 (H) 05/16/2019  ? ?Hypertension Interventions:  (Status:  Goal on track:  Yes.) Long Term Goal ?Last practice recorded BP readings:  ?BP Readings from Last 3 Encounters:  ?01/06/21  136/77  ?11/04/20 (!) 148/78  ?10/23/20 (!) 156/70  ?Most recent eGFR/CrCl: No results found for: EGFR  No components found for: CRCL ? ?Evaluation of current treatment plan related to hypertension self management and patient's adherence to plan as established by provider ?Provided education to patient re: stroke prevention, s/s of heart attack and stroke ?Reviewed medications with patient and discussed importance of compliance ?Discussed plans with patient for ongoing care management follow up and provided patient with direct contact information for care management team ?Provided education on prescribed diet low sodium ?Discussed complications of poorly controlled blood pressure such as heart disease, stroke, circulatory complications, vision complications, kidney impairment, sexual  dysfunction ?Encouraged monitoring B/P 2-3 times weekly, recording the values, and contacting provider for issues and concerns ?Nurse previously sent patient hypertension education ? ?Patient Goals/Self-Care Activities: ?Take all medicat

## 2021-12-03 DIAGNOSIS — D472 Monoclonal gammopathy: Secondary | ICD-10-CM | POA: Diagnosis not present

## 2021-12-03 DIAGNOSIS — N189 Chronic kidney disease, unspecified: Secondary | ICD-10-CM | POA: Diagnosis not present

## 2021-12-03 DIAGNOSIS — N200 Calculus of kidney: Secondary | ICD-10-CM | POA: Diagnosis not present

## 2021-12-03 DIAGNOSIS — E1165 Type 2 diabetes mellitus with hyperglycemia: Secondary | ICD-10-CM | POA: Diagnosis not present

## 2021-12-03 DIAGNOSIS — I10 Essential (primary) hypertension: Secondary | ICD-10-CM | POA: Diagnosis not present

## 2021-12-03 DIAGNOSIS — E875 Hyperkalemia: Secondary | ICD-10-CM | POA: Diagnosis not present

## 2021-12-03 DIAGNOSIS — Z6829 Body mass index (BMI) 29.0-29.9, adult: Secondary | ICD-10-CM | POA: Diagnosis not present

## 2021-12-16 ENCOUNTER — Other Ambulatory Visit (HOSPITAL_COMMUNITY): Payer: Self-pay | Admitting: Vascular Surgery

## 2021-12-16 ENCOUNTER — Ambulatory Visit (INDEPENDENT_AMBULATORY_CARE_PROVIDER_SITE_OTHER): Payer: Medicare HMO | Admitting: Vascular Surgery

## 2021-12-16 ENCOUNTER — Encounter: Payer: Self-pay | Admitting: Vascular Surgery

## 2021-12-16 ENCOUNTER — Other Ambulatory Visit: Payer: Self-pay

## 2021-12-16 ENCOUNTER — Ambulatory Visit (INDEPENDENT_AMBULATORY_CARE_PROVIDER_SITE_OTHER): Payer: Medicare HMO

## 2021-12-16 VITALS — BP 126/67 | HR 51 | Ht 69.0 in | Wt 198.6 lb

## 2021-12-16 DIAGNOSIS — I739 Peripheral vascular disease, unspecified: Secondary | ICD-10-CM | POA: Diagnosis not present

## 2021-12-16 DIAGNOSIS — R6889 Other general symptoms and signs: Secondary | ICD-10-CM

## 2021-12-16 NOTE — Progress Notes (Signed)
? ? ?[facility] [place] ? ?Patient name: Derek [name] MRN: [id number] DOB: 08-21-1946 Sex: male ? ?REASON FOR CONSULT: Evaluation abnormal screening ABI ? ?HPI: ?Derek [name] is a 76 y.o. male, who is here today for evaluation and discussion of abnormal ABI.  He remains quite active.  He denies any claudication symptoms or rest pain.  He has no history of lower extremity tissue loss.  Does have a history of prior coronary artery disease with bypass in 2010.  Fortunately he quit smoking at the time of his bypass. ? ?Past Medical History:  ?Diagnosis Date  ? Arthritis   ? fingers  ? CAD (coronary artery disease)   ? CABG 2010 - [place] to LAD, SVG to diagonal, SVG to PDA and PLV  ? Essential hypertension   ? GERD (gastroesophageal reflux disease)   ? History of iron deficiency anemia   ? History of kidney stones   ? History of pneumonia   ? Hyperlipidemia   ? NSTEMI (non-ST elevated myocardial infarction) ([place]) 2010  ? Type 2 diabetes mellitus ([place])   ? ? ?Family History  ?Problem Relation Age of Onset  ? Thyroid disease Mother   ? Heart attack Father   ? ? ?SOCIAL HISTORY: ?Social History  ? ?Socioeconomic History  ? Marital status: Married  ?  Spouse name: [name]  ? Number of children: 3  ? Years of education: Not on file  ? Highest education level: Not on file  ?Occupational History  ? 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 Smoking status: Former  ?  Packs/day: 3.00  ?  Years: 12.00  ?  Pack years: 36.00  ?  Types: Cigarettes  ?  Quit date: 2010  ?  Years since quitting: 13.2  ? Smokeless tobacco: Never  ?Vaping Use  ? Vaping Use: Never used  ?Substance and Sexual Activity  ? Alcohol use: Yes  ?  Comment: 6 oz beer each day during the week, not on weekends  ? Drug use: Never  ? Sexual activity: Not Currently  ?Other Topics Concern  ? Not on file  ?Social History Narrative  ? Not on file  ? ?Social Determinants of Health  ? ?Financial  Resource Strain: Medium Risk  ? Difficulty of Paying Living Expenses: Somewhat hard  ?Food Insecurity: No Food Insecurity  ? Worried About Charity fundraiser in the Last Year: Never true  ? Ran Out of Food in the Last Year: Never true  ?Transportation Needs: No Transportation Needs  ? Lack of Transportation (Medical): No  ? Lack of Transportation (Non-Medical): No  ?Physical Activity: Not on file  ?Stress: Not on file  ?Social Connections: Not on file  ?Intimate Partner Violence: Not on file  ? ? ?No Known Allergies ? ?Current Outpatient Medications  ?Medication Sig Dispense Refill  ? amLODipine (NORVASC) 2.5 MG tablet Take 2.5 mg by mouth daily.    ? aspirin EC 81 MG tablet Take 81 mg by mouth daily.    ? glipiZIDE (GLUCOTROL) 5 MG tablet Take 2.5 mg by mouth every morning.    ? Histamine Dihydrochloride (AUSTRALIAN DREAM ARTHRITIS) 0.025 % CREA Apply 1 application topically daily as needed (pain).    ? Lidocaine HCl 4 % CREA Apply 1 application topically daily as needed (pain).    ? metoprolol tartrate (LOPRESSOR) 50 MG tablet Take 25 mg by mouth 2 (two) times daily.    ? nitroGLYCERIN (NITROSTAT) 0.4 MG SL  tablet Place 1 tablet (0.4 mg total) under the tongue every 5 (five) minutes as needed for chest pain. 25 tablet 3  ? ondansetron (ZOFRAN) 4 MG tablet Take 1 tablet (4 mg total) by mouth every 8 (eight) hours as needed for nausea or vomiting. 20 tablet 0  ? pravastatin (PRAVACHOL) 40 MG tablet Take 40 mg by mouth daily.    ? sildenafil (VIAGRA) 100 MG tablet Take 1 tablet (100 mg total) by mouth as needed for erectile dysfunction. 10 tablet 3  ? tamsulosin (FLOMAX) 0.4 MG CAPS capsule Take 0.4 mg by mouth daily.     ? traMADol (ULTRAM) 50 MG tablet Take 50 mg by mouth 2 (two) times daily.     ? ?No current facility-administered medications for this visit.  ? ? ?REVIEW OF SYSTEMS:  ?'[X]'$  denotes positive finding, '[ ]'$  denotes negative finding ?Cardiac  Comments:  ?Chest pain or chest pressure:    ?Shortness of  breath upon exertion:    ?Short of breath when lying flat:    ?Irregular heart rhythm:    ?    ?Vascular    ?Pain in calf, thigh, or hip brought on by ambulation:    ?Pain in feet at night that wakes you up from your sleep:     ?Blood clot in your veins:    ?Leg swelling:     ?    ?Pulmonary    ?Oxygen at home:    ?Productive cough:     ?Wheezing:     ?    ?Neurologic    ?Sudden weakness in arms or legs:     ?Sudden numbness in arms or legs:     ?Sudden onset of difficulty speaking or slurred speech:    ?Temporary loss of vision in one eye:     ?Problems with dizziness:     ?    ?Gastrointestinal    ?Blood in stool:     ?Vomited blood:     ?    ?Genitourinary    ?Burning when urinating:     ?Blood in urine:    ?    ?Psychiatric    ?Major depression:     ?    ?Hematologic    ?Bleeding problems:    ?Problems with blood clotting too easily:    ?    ?Skin    ?Rashes or ulcers:    ?    ?Constitutional    ?Fever or chills:    ? ? ?PHYSICAL EXAM: ?Vitals:  ? 12/16/21 0858  ?BP: 126/67  ?Pulse: (!) 51  ?Weight: 198 lb 9.6 oz (90.1 kg)  ?Height: '5\' 9"'$  (1.753 m)  ? ? ?GENERAL: The patient is a well-nourished male, in no acute distress. The vital signs are documented above. ?CARDIOVASCULAR: 2+ radial pulses bilaterally.  2+ femoral pulses bilaterally.  2+ posterior tibial pulse bilaterally. ?PULMONARY: There is good air exchange  ?MUSCULOSKELETAL: There are no major deformities or cyanosis. ?NEUROLOGIC: No focal weakness or paresthesias are detected. ?SKIN: There are no ulcers or rashes noted. ?PSYCHIATRIC: The patient has a normal affect. ? ?DATA:  ?Noninvasive studies reveal mildly diminished ABI 0.78 on the right and greater than 1.0 on the left.  Triphasic waveforms bilaterally and normal total pressures bilaterally ? ?MEDICAL ISSUES: ?Discussed this with patient.  I do not see evidence of arterial insufficiency.  He has had a positive screening that there frequently follows positives with his sensitive screen.  I  reassured him that he does not have any  evidence of significant lower extremity arterial insufficiency.  He will see [place] again on an as-needed basis ? ? ?Rosetta Posner, MD FACS ?[facility] [place] ?Office Tel [phone] ?Pager [phone] ? ?Note: Portions of this report may have been transcribed using voice recognition software.  Every effort has been made to ensure accuracy; however, inadvertent computerized transcription errors may still be present. ? ?

## 2021-12-31 IMAGING — CT CT RENAL STONE PROTOCOL
2 of 4 series · 15 of 46 positions shown, 17 images · non-contrast
Comparison: Are multiple renal ultrasound 08/02/2019 and CT abdomen
10/11/2019

CLINICAL DATA: Right-sided flank pain. Low back pain for 3 weeks.

EXAM:
CT ABDOMEN AND PELVIS WITHOUT CONTRAST
TECHNIQUE: Multidetector CT imaging of the abdomen and pelvis was performed
following the standard protocol without IV contrast.

[Series 2: axial st · axial · 0.90mm/px · z∈[+765,+1190]mm · 12 of 99 slices shown, 14 images]
[im 7/99  soft-tissue]
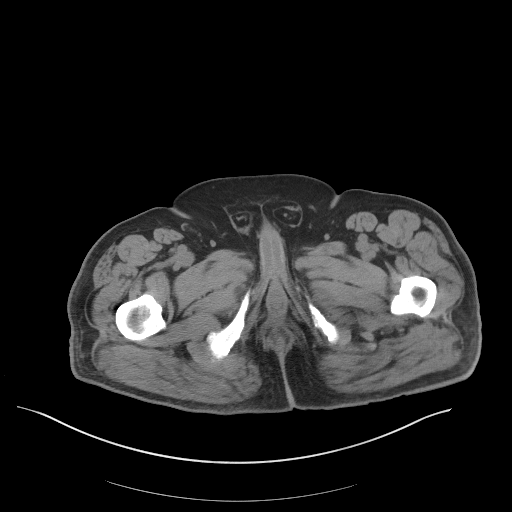
[im 7/99  bone]
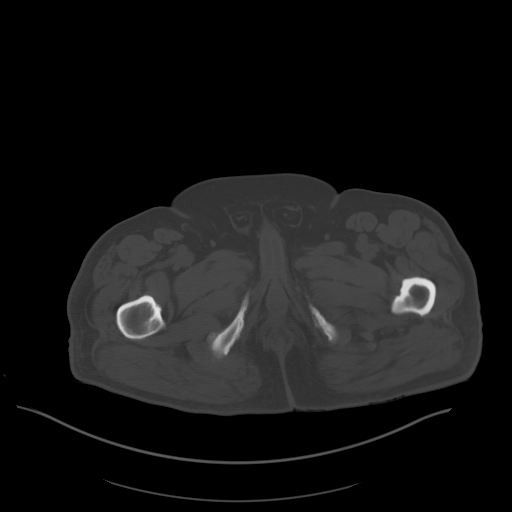
[im 14/99  soft-tissue]
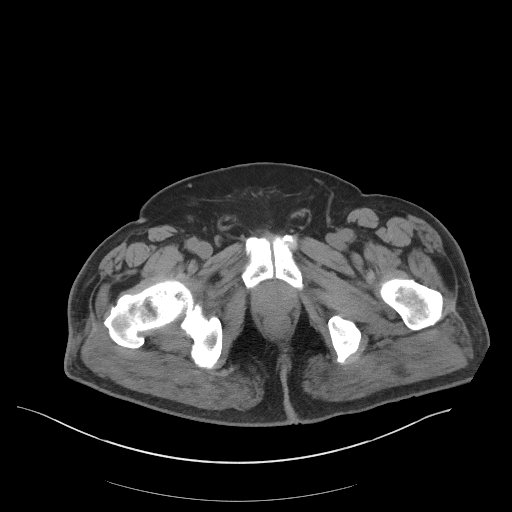
[im 20/99  soft-tissue]
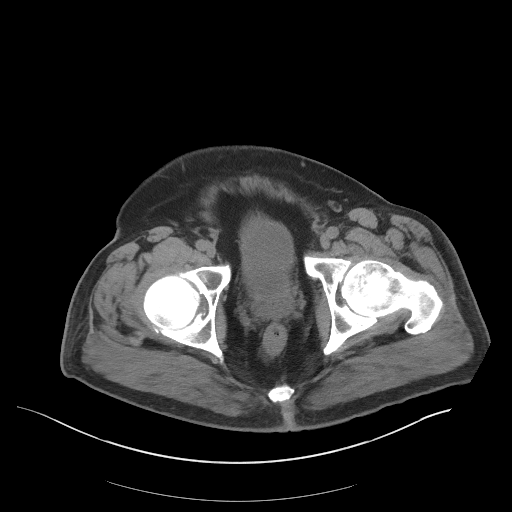
[im 33/99  soft-tissue]
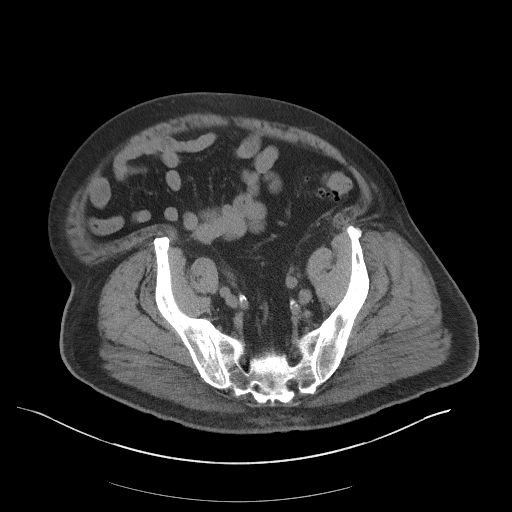
[im 40/99  soft-tissue]
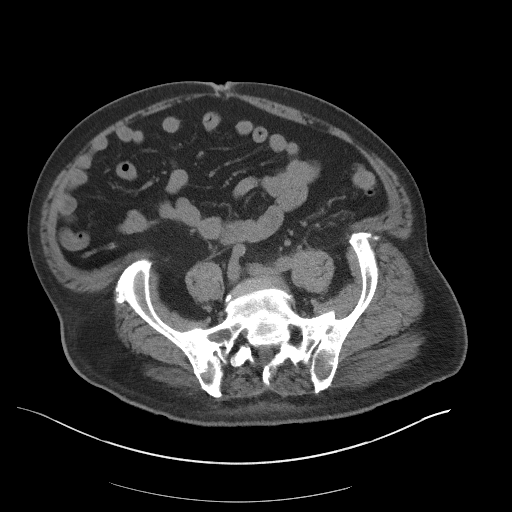
[im 46/99  soft-tissue]
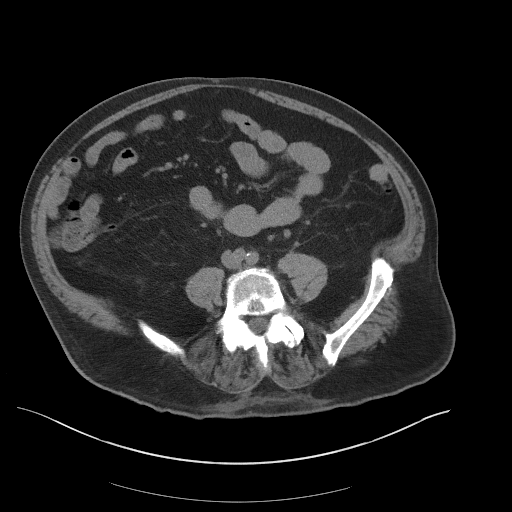
[im 53/99  soft-tissue]
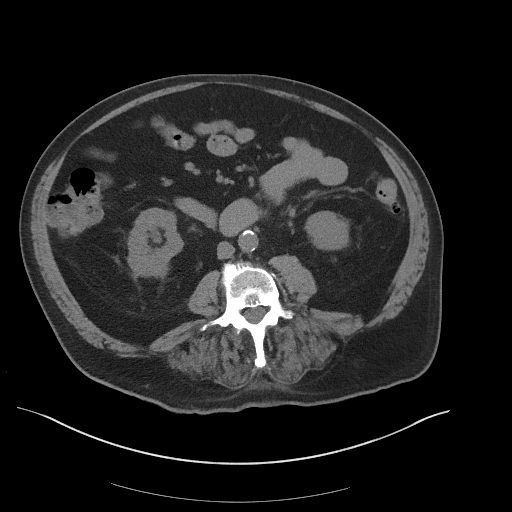
[im 59/99  soft-tissue]
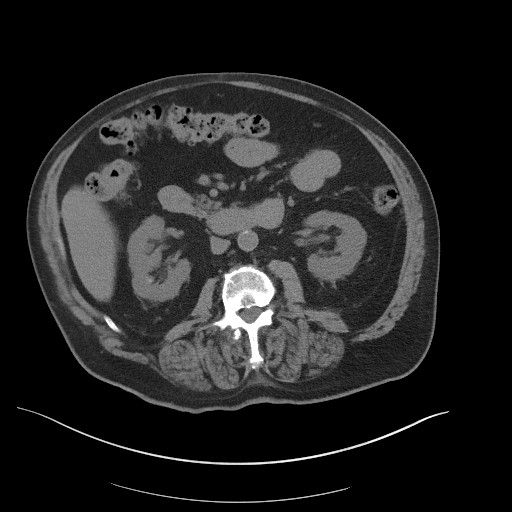
[im 66/99  soft-tissue]
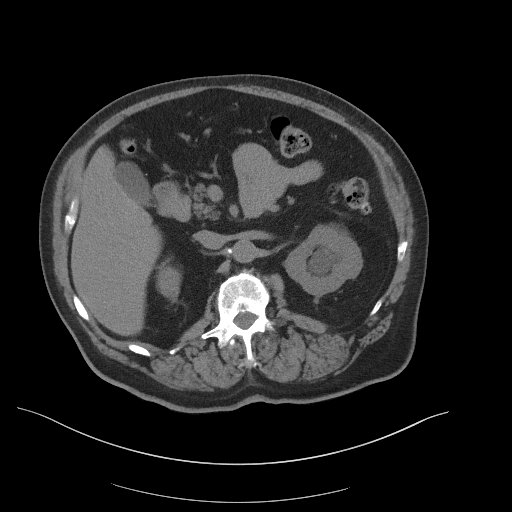
[im 66/99  bone]
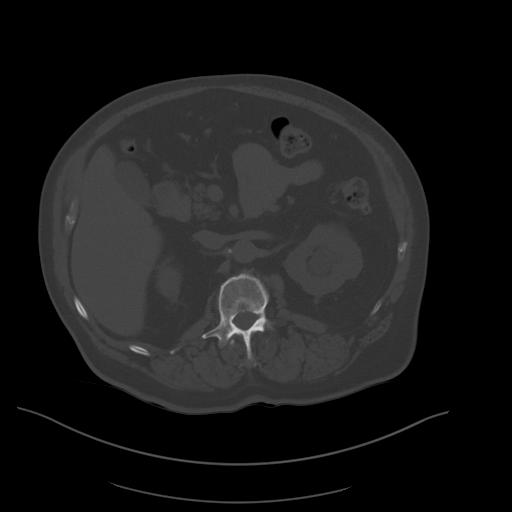
[im 79/99  soft-tissue]
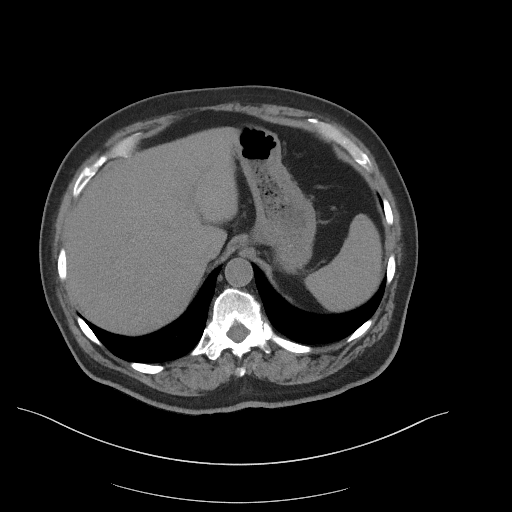
[im 85/99  soft-tissue]
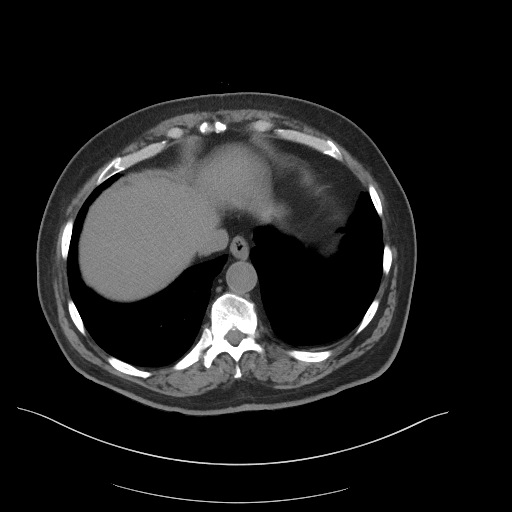
[im 92/99  soft-tissue]
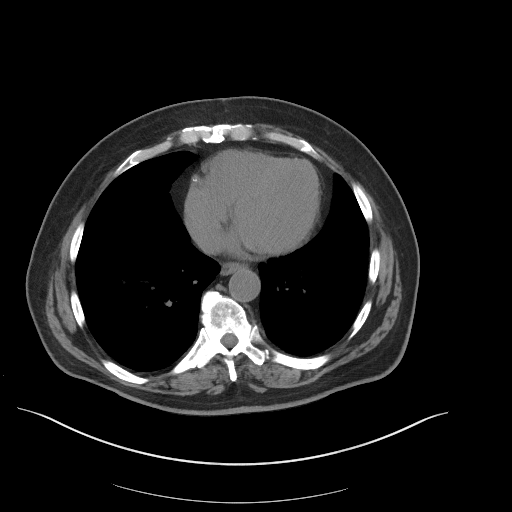

[Series 5: coronal st · coronal · 0.79mm/px · 3 of 116 slices shown]
[im 39/116  soft-tissue]
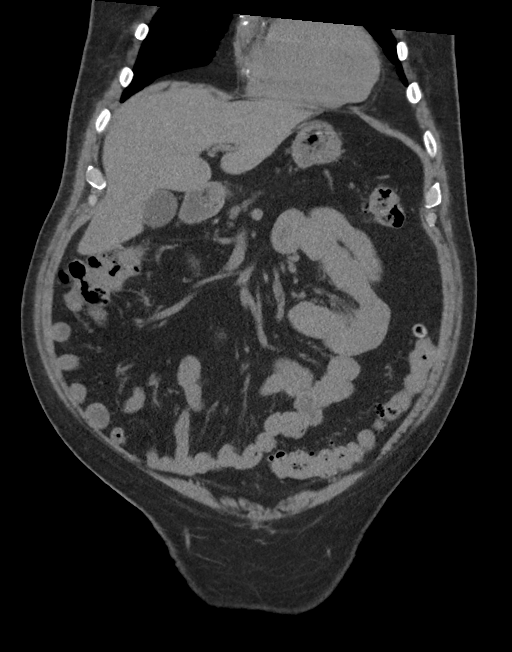
[im 52/116  soft-tissue]
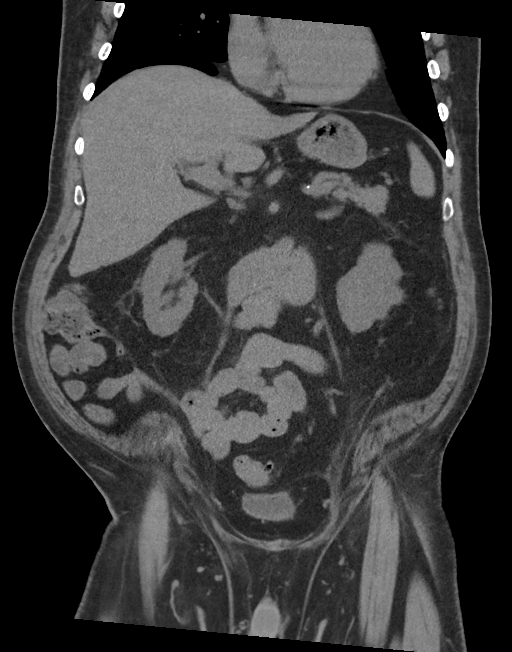
[im 64/116  soft-tissue]
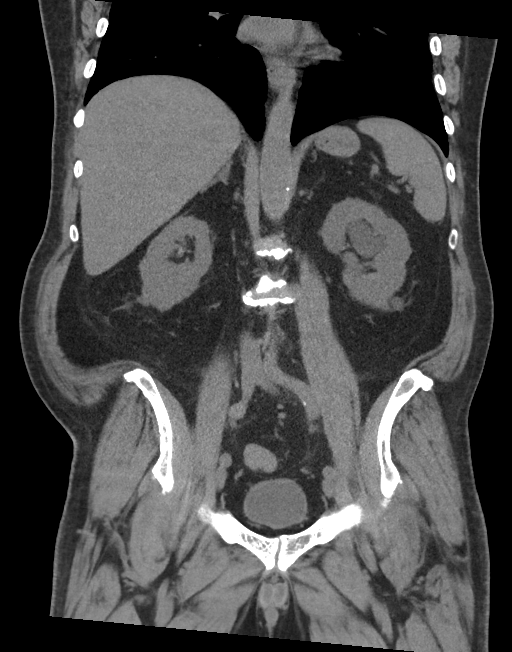

[15 of 46 positions shown; findings below may reference images not displayed]

FINDINGS: Lower chest: Stable scarring in the right middle lobe. Chronic sub
solid density posteromedially in both lower lobes, left greater than
right, likely due to minimal atelectasis/scar. Coronary
atherosclerosis.

Hepatobiliary: Unremarkable

Pancreas: Unremarkable

Spleen: Unremarkable

Adrenals/Urinary Tract: Both adrenal glands appear normal.

Six nonobstructive right renal calculi are present measuring up to
1.0 cm in long axis (image 51/5)

Four left renal calculi are present measuring up to 1.4 cm in long
axis (image 57/5). The dominant 1.4 cm calculus is in the vicinity
of a left kidney upper pole infundibulum, and there appears to be
downstream caliceal blunting/dilatation suggesting blockage of this
infundibulum, a similar appearance to [DATE] [DATE] 21

The previous right UPJ calculus shown on the 10/11/2019 exam is no
longer seen.

Stomach/Bowel: Wall thickening is present diffusely in the rectum
although may be from nondistention. Descending and sigmoid colon
diverticulosis noted with scattered diverticula elsewhere in the
colon, but no findings of active diverticulitis. Normal appendix.

Vascular/Lymphatic: Aortoiliac atherosclerotic vascular disease. No
pathologic adenopathy.

Reproductive: Unremarkable

Other: No supplemental non-categorized findings.

Musculoskeletal: Prior median sternotomy. Bridging spurring of both
sacroiliac joints. Lumbar spondylosis and degenerative disc disease
causing multilevel impingement. Transitional S1 vertebra.
IMPRESSION: 1. Bilateral nonobstructive nephrolithiasis. The dominant 1.4 cm
calculus is in a left kidney upper pole infundibulum, and there is
downstream caliceal blunting/dilatation in the left kidney upper
pole suggesting blockage of this infundibulum.
2. Other imaging findings of potential clinical significance:
Descending and sigmoid colon diverticulosis. Aortoiliac
atherosclerotic vascular disease. Lumbar spondylosis and
degenerative disc disease causing multilevel impingement. Bridging
spurring of both sacroiliac joints. Transitional S1 vertebra.

## 2022-01-06 ENCOUNTER — Ambulatory Visit (INDEPENDENT_AMBULATORY_CARE_PROVIDER_SITE_OTHER): Payer: Medicare HMO | Admitting: Urology

## 2022-01-06 ENCOUNTER — Encounter: Payer: Self-pay | Admitting: Urology

## 2022-01-06 ENCOUNTER — Ambulatory Visit (HOSPITAL_COMMUNITY)
Admission: RE | Admit: 2022-01-06 | Discharge: 2022-01-06 | Disposition: A | Discharge status: Home / Self Care | Payer: Medicare HMO | Source: Ambulatory Visit | Attending: Urology | Admitting: Urology

## 2022-01-06 VITALS — BP 94/50 | HR 67

## 2022-01-06 DIAGNOSIS — N401 Enlarged prostate with lower urinary tract symptoms: Secondary | ICD-10-CM

## 2022-01-06 DIAGNOSIS — N200 Calculus of kidney: Secondary | ICD-10-CM | POA: Insufficient documentation

## 2022-01-06 DIAGNOSIS — N400 Enlarged prostate without lower urinary tract symptoms: Secondary | ICD-10-CM

## 2022-01-06 DIAGNOSIS — N5201 Erectile dysfunction due to arterial insufficiency: Secondary | ICD-10-CM

## 2022-01-06 DIAGNOSIS — R351 Nocturia: Secondary | ICD-10-CM

## 2022-01-06 LAB — URINALYSIS, ROUTINE W REFLEX MICROSCOPIC
Bilirubin, UA: NEGATIVE
Glucose, UA: NEGATIVE
Ketones, UA: NEGATIVE
Leukocytes,UA: NEGATIVE
Nitrite, UA: NEGATIVE
Protein,UA: NEGATIVE
RBC, UA: NEGATIVE
Specific Gravity, UA: 1.025 (ref 1.005–1.030)
Urobilinogen, Ur: 0.2 mg/dL (ref 0.2–1.0)
pH, UA: 5.5 (ref 5.0–7.5)

## 2022-01-06 MED ORDER — TAMSULOSIN HCL 0.4 MG PO CAPS: 0.4000 mg | ORAL_CAPSULE | Freq: Every day | ORAL | 11 refills | Status: AC

## 2022-01-06 MED ORDER — SILDENAFIL CITRATE 100 MG PO TABS: 100.0000 mg | ORAL_TABLET | ORAL | 5 refills | Status: AC | PRN

## 2022-01-06 NOTE — Patient Instructions (Signed)
Dietary Guidelines to Help Prevent Kidney Stones Kidney stones are deposits of minerals and salts that form inside your kidneys. Your risk of developing kidney stones may be greater depending on your diet, your lifestyle, the medicines you take, and whether you have certain medical conditions. Most people can lower their chances of developing kidney stones by following the instructions below. Your dietitian may give you more specific instructions depending on your overall health and the type of kidney stones you tend to develop. What are tips for following this plan? Reading food labels  Choose foods with "no salt added" or "low-salt" labels. Limit your salt (sodium) intake to less than 1,500 mg a day. Choose foods with calcium for each meal and snack. Try to eat about 300 mg of calcium at each meal. Foods that contain 200-500 mg of calcium a serving include: 8 oz (237 mL) of milk, calcium-fortifiednon-dairy milk, and calcium-fortifiedfruit juice. Calcium-fortified means that calcium has been added to these drinks. 8 oz (237 mL) of kefir, yogurt, and soy yogurt. 4 oz (114 g) of tofu. 1 oz (28 g) of cheese. 1 cup (150 g) of dried figs. 1 cup (91 g) of cooked broccoli. One 3 oz (85 g) can of sardines or mackerel. Most people need 1,000-1,500 mg of calcium a day. Talk to your dietitian about how much calcium is recommended for you. Shopping Buy plenty of fresh fruits and vegetables. Most people do not need to avoid fruits and vegetables, even if these foods contain nutrients that may contribute to kidney stones. When shopping for convenience foods, choose: Whole pieces of fruit. Pre-made salads with dressing on the side. Low-fat fruit and yogurt smoothies. Avoid buying frozen meals or prepared deli foods. These can be high in sodium. Look for foods with live cultures, such as yogurt and kefir. Choose high-fiber grains, such as whole-wheat breads, oat bran, and wheat cereals. Cooking Do not add  salt to food when cooking. Place a salt shaker on the table and allow each person to add his or her own salt to taste. Use vegetable protein, such as beans, textured vegetable protein (TVP), or tofu, instead of meat in pasta, casseroles, and soups. Meal planning Eat less salt, if told by your dietitian. To do this: Avoid eating processed or pre-made food. Avoid eating fast food. Eat less animal protein, including cheese, meat, poultry, or fish, if told by your dietitian. To do this: Limit the number of times you have meat, poultry, fish, or cheese each week. Eat a diet free of meat at least 2 days a week. Eat only one serving each day of meat, poultry, fish, or seafood. When you prepare animal protein, cut pieces into small portion sizes. For most meat and fish, one serving is about the size of the palm of your hand. Eat at least five servings of fresh fruits and vegetables each day. To do this: Keep fruits and vegetables on hand for snacks. Eat one piece of fruit or a handful of berries with breakfast. Have a salad and fruit at lunch. Have two kinds of vegetables at dinner. Limit foods that are high in a substance called oxalate. These include: Spinach (cooked), rhubarb, beets, sweet potatoes, and Swiss chard. Peanuts. Potato chips, french fries, and baked potatoes with skin on. Nuts and nut products. Chocolate. If you regularly take a diuretic medicine, make sure to eat at least 1 or 2 servings of fruits or vegetables that are high in potassium each day. These include: Avocado. Banana. Orange, prune,   carrot, or tomato juice. Baked potato. Cabbage. Beans and split peas. Lifestyle  Drink enough fluid to keep your urine pale yellow. This is the most important thing you can do. Spread your fluid intake throughout the day. If you drink alcohol: Limit how much you use to: 0-1 drink a day for women who are not pregnant. 0-2 drinks a day for men. Be aware of how much alcohol is in your  drink. In the [place] one drink equals one 12 oz bottle of beer (355 mL), one 5 oz glass of wine (148 mL), or one 1 oz glass of hard liquor (44 mL). Lose weight if told by your health care provider. Work with your dietitian to find an eating plan and weight loss strategies that work best for you. General information Talk to your health care provider and dietitian about taking daily supplements. You may be told the following depending on your health and the cause of your kidney stones: Not to take supplements with vitamin C. To take a calcium supplement. To take a daily probiotic supplement. To take other supplements such as magnesium, fish oil, or vitamin B6. Take over-the-counter and prescription medicines only as told by your health care provider. These include supplements. What foods should I limit? Limit your intake of the following foods, or eat them as told by your dietitian. Vegetables Spinach. Rhubarb. Beets. Canned vegetables. Pickles. Olives. Baked potatoes with skin. Grains Wheat bran. Baked goods. Salted crackers. Cereals high in sugar. Meats and other proteins Nuts. Nut butters. Large portions of meat, poultry, or fish. Salted, precooked, or cured meats, such as sausages, meat loaves, and hot dogs. Dairy Cheese. Beverages Regular soft drinks. Regular vegetable juice. Seasonings and condiments Seasoning blends with salt. Salad dressings. Soy sauce. Ketchup. Barbecue sauce. Other foods Canned soups. Canned pasta sauce. Casseroles. Pizza. Lasagna. Frozen meals. Potato chips. French fries. The items listed above may not be a complete list of foods and beverages you should limit. Contact a dietitian for more information. What foods should I avoid? Talk to your dietitian about specific foods you should avoid based on the type of kidney stones you have and your overall health. Fruits Grapefruit. The item listed above may not be a complete list of foods and beverages you should  avoid. Contact a dietitian for more information. Summary Kidney stones are deposits of minerals and salts that form inside your kidneys. You can lower your risk of kidney stones by making changes to your diet. The most important thing you can do is drink enough fluid. Drink enough fluid to keep your urine pale yellow. Talk to your dietitian about how much calcium you should have each day, and eat less salt and animal protein as told by your dietitian. This information is not intended to replace advice given to you by your health care provider. Make sure you discuss any questions you have with your health care provider. Document Revised: 05/18/2021 Document Reviewed: 05/18/2021 Elsevier Patient Education  2023 Elsevier Inc.  

## 2022-01-06 NOTE — Progress Notes (Signed)
? ?01/06/2022 ?9:54 AM  ? ?[place] ?Nov 23, 1945 ?[id number] ? ?Referring provider: Allwardt, Randa Evens, PA-C ?[address] ?[place],  [place] [address] ? ?Followup nephrolithiasis, BPH and erectile dysfunction ? ? ?HPI: ?Derek [name] is a 76yo here for followup for BPH, erectile dysfunction and nephrolithiasis. No stone events since last visit. KUB from today shows stable bilateral renal calculi. He denies flank pain. IPSS 12 QOl 2 on flomax 0.'4mg'$  daily. Nocturia 2x. Urine stream strong. No straining to urinate. He uses sildeanfil '100mg'$  PRN for his erectile dysfunction with good results. No other complaitns today ? ? ?PMH: ?Past Medical History:  ?Diagnosis Date  ? Arthritis   ? fingers  ? CAD (coronary artery disease)   ? CABG 2010 - [place] to LAD, SVG to diagonal, SVG to PDA and PLV  ? Essential hypertension   ? GERD (gastroesophageal reflux disease)   ? History of iron deficiency anemia   ? History of kidney stones   ? History of pneumonia   ? Hyperlipidemia   ? NSTEMI (non-ST elevated myocardial infarction) ([place]) 2010  ? Type 2 diabetes mellitus ([place])   ? ? ?Surgical History: ?Past Surgical History:  ?Procedure Laterality Date  ? CORONARY ARTERY BYPASS GRAFT  2010  ? QUADRUPLE BYPASS AFTER HEART ATTACK   ? CYSTOSCOPY W/ URETERAL STENT PLACEMENT Right 05/16/2019  ? Procedure: CYSTOSCOPY WITH RETROGRADE PYELOGRAM/URETERAL STENT PLACEMENT;  Surgeon: Bjorn Loser, MD;  Location: [facility];  Service: Urology;  Laterality: Right;  ? CYSTOSCOPY W/ URETERAL STENT REMOVAL Right 06/04/2019  ? Procedure: CYSTOSCOPY WITH STENT REMOVAL;  Surgeon: Franchot Gallo, MD;  Location: [facility];  Service: Urology;  Laterality: Right;  ? CYSTOSCOPY WITH HOLMIUM LASER LITHOTRIPSY Right 06/04/2019  ? Procedure: CYSTOSCOPY, URETEROSCOPY, WITH HOLMIUM LASER, STENT PLACEMENT;  Surgeon: Franchot Gallo, MD;  Location: [facility];  Service: Urology;  Laterality: Right;  ? CYSTOSCOPY/URETEROSCOPY/HOLMIUM LASER/STENT PLACEMENT Left 06/04/2019  ?  Procedure: CYSTOSCOPY, URETEROSCOPY, RETROGRADE PYELOGRAM, STENT PLACEMENT;  Surgeon: Franchot Gallo, MD;  Location: [facility];  Service: Urology;  Laterality: Left;  ? PILONIDAL CYST EXCISION  1960  ? ? ?Home Medications:  ?Allergies as of 01/06/2022   ?No Known Allergies ?  ? ?  ?Medication List  ?  ? ?  ? Accurate as of January 06, 2022  9:54 AM. If you have any questions, ask your nurse or doctor.  ?  ?  ? ?  ? ?amLODipine 2.5 MG tablet ?Commonly known as: NORVASC ?Take 2.5 mg by mouth daily. ?  ?aspirin EC 81 MG tablet ?Take 81 mg by mouth daily. ?  ?[place] Dream Arthritis 0.025 % Crea ?Generic drug: Histamine Dihydrochloride ?Apply 1 application topically daily as needed (pain). ?  ?glipiZIDE 5 MG tablet ?Commonly known as: GLUCOTROL ?Take 2.5 mg by mouth every morning. ?  ?Lidocaine HCl 4 % Crea ?Apply 1 application topically daily as needed (pain). ?  ?metoprolol tartrate 50 MG tablet ?Commonly known as: LOPRESSOR ?Take 25 mg by mouth 2 (two) times daily. ?  ?nitroGLYCERIN 0.4 MG SL tablet ?Commonly known as: NITROSTAT ?Place 1 tablet (0.4 mg total) under the tongue every 5 (five) minutes as needed for chest pain. ?  ?ondansetron 4 MG tablet ?Commonly known as: Zofran ?Take 1 tablet (4 mg total) by mouth every 8 (eight) hours as needed for nausea or vomiting. ?  ?pravastatin 40 MG tablet ?Commonly known as: PRAVACHOL ?Take 40 mg by mouth daily. ?  ?sildenafil 100 MG tablet ?Commonly known as: VIAGRA ?Take 1  tablet (100 mg total) by mouth as needed for erectile dysfunction. ?  ?tamsulosin 0.4 MG Caps capsule ?Commonly known as: FLOMAX ?Take 1 capsule (0.4 mg total) by mouth daily after supper. ?What changed: when to take this ?  ?traMADol 50 MG tablet ?Commonly known as: ULTRAM ?Take 50 mg by mouth 2 (two) times daily. ?  ? ?  ? ? ?Allergies: No Known Allergies ? ?Family History: ?Family History  ?Problem Relation Age of Onset  ? Thyroid disease Mother   ? Heart attack Father   ? ? ?Social History:  reports  that he quit smoking about 13 years ago. His smoking use included cigarettes. He has a 36.00 pack-year smoking history. He has never used smokeless tobacco. He reports current alcohol use. He reports that he does not use drugs. ? ?ROS: ?All other review of systems were reviewed and are negative except what is noted above in HPI ? ?Physical Exam: ?BP (!) 94/50   Pulse 67   ?Constitutional:  Alert and oriented, No acute distress. ?HEENT: [place] AT, moist mucus membranes.  Trachea midline, no masses. ?Cardiovascular: No clubbing, cyanosis, or edema. ?Respiratory: Normal respiratory effort, no increased work of breathing. ?GI: Abdomen is soft, nontender, nondistended, no abdominal masses ?GU: No CVA tenderness.  ?Lymph: No cervical or inguinal lymphadenopathy. ?Skin: No rashes, bruises or suspicious lesions. ?Neurologic: Grossly intact, no focal deficits, moving all 4 extremities. ?Psychiatric: Normal mood and affect. ? ?Laboratory Data: ?Lab Results  ?Component Value Date  ? WBC 8.9 08/11/2020  ? HGB 11.5 (L) 08/11/2020  ? HCT 36.4 (L) 08/11/2020  ? MCV 97.8 08/11/2020  ? PLT 336 08/11/2020  ? ? ?Lab Results  ?Component Value Date  ? CREATININE 2.87 (H) 08/11/2020  ? ? ?No results found for: PSA ? ?No results found for: TESTOSTERONE ? ?Lab Results  ?Component Value Date  ? HGBA1C 6.6 (H) 05/16/2019  ? ? ?Urinalysis ?   ?Component Value Date/Time  ? [place] 05/17/2019 1041  ? APPEARANCEUR Clear 01/06/2021 1459  ? LABSPEC 1.014 05/17/2019 1041  ? PHURINE 6.0 05/17/2019 1041  ? GLUCOSEU Negative 01/06/2021 1459  ? HGBUR LARGE (A) 05/17/2019 1041  ? BILIRUBINUR Negative 01/06/2021 1459  ? [place] NEGATIVE 05/17/2019 1041  ? PROTEINUR Negative 01/06/2021 1459  ? PROTEINUR 30 (A) 05/17/2019 1041  ? UROBILINOGEN 0.2 11/07/2019 0954  ? NITRITE Negative 01/06/2021 1459  ? NITRITE NEGATIVE 05/17/2019 1041  ? LEUKOCYTESUR Negative 01/06/2021 1459  ? LEUKOCYTESUR MODERATE (A) 05/17/2019 1041  ? ? ?Lab Results  ?Component  Value Date  ? LABMICR Comment 12/15/2020  ? WBCUA 0-5 09/08/2020  ? LABEPIT None seen 09/08/2020  ? MUCUS Present 09/08/2020  ? BACTERIA None seen 09/08/2020  ? ? ?Pertinent Imaging: ?KUB today: Images reviewed and discussed with the patient  ?Results for orders placed during the hospital encounter of 01/06/21 ? ?Abdomen 1 view (KUB) ? ?Narrative ?CLINICAL DATA:  Kidney stone follow-up.  No current complaints. ? ?EXAM: ?ABDOMEN - 1 VIEW ? ?COMPARISON:  CT abdomen pelvis dated October 23, 2020. ? ?FINDINGS: ?Unchanged left lower pole renal calculi. Previously seen right renal ?calculi are not well visualized by x-ray. Normal bowel gas pattern. ?No acute osseous abnormality. ? ?IMPRESSION: ?1. Unchanged left nephrolithiasis. ? ? ?Electronically Signed ?By: Titus Dubin M.D. ?On: 01/07/2021 13:47 ? ?No results found for this or any previous visit. ? ?No results found for this or any previous visit. ? ?No results found for this or any previous visit. ? ?Results for  orders placed during the hospital encounter of 09/03/20 ? ?US RENAL ? ?Narrative ?CLINICAL DATA:  Acute kidney failure ? ?EXAM: ?RENAL / URINARY TRACT ULTRASOUND COMPLETE ? ?COMPARISON:  08/01/2020, 02/04/2020, CT 11/02/2019 ? ?FINDINGS: ?Right Kidney: ? ?Renal measurements: 14.2 x 6.2 x 6.8 cm = volume: 335.2 mL. ?Echogenicity within normal limits. There is moderate right ?hydronephrosis which is similar to 08/01/2020, but increased ?compared with 02/04/2020. 1.6 cm shadowing echogenic focus lower ?pole right kidney likely representing a kidney stone. ? ?Left Kidney: ? ?Renal measurements: 13.8 x 6.7 x 5.8 cm = volume: 276.3 mL. Cortical ?echogenicity is normal. No hydronephrosis. 1.8 cm shadowing ?echogenic focus lower pole left kidney, likely representing a stone. ? ?Bladder: ? ?Appears normal for degree of bladder distention. ? ?Other: ? ?None. ? ?IMPRESSION: ?1. Bilateral intrarenal calculi. Moderate right hydronephrosis, ?suggest correlation with CT  KUB to assess for distal obstruction. ?2. Negative for left hydronephrosis ? ? ?Electronically Signed ?By: Donavan Foil M.D. ?On: 09/03/2020 15:20 ? ?No results found for this or any previous visit. ?

## 2022-01-19 DIAGNOSIS — L989 Disorder of the skin and subcutaneous tissue, unspecified: Secondary | ICD-10-CM | POA: Diagnosis not present

## 2022-01-19 DIAGNOSIS — I10 Essential (primary) hypertension: Secondary | ICD-10-CM | POA: Diagnosis not present

## 2022-01-19 DIAGNOSIS — Z6829 Body mass index (BMI) 29.0-29.9, adult: Secondary | ICD-10-CM | POA: Diagnosis not present

## 2022-01-27 DIAGNOSIS — D492 Neoplasm of unspecified behavior of bone, soft tissue, and skin: Secondary | ICD-10-CM | POA: Diagnosis not present

## 2022-02-01 DIAGNOSIS — Z01818 Encounter for other preprocedural examination: Secondary | ICD-10-CM | POA: Diagnosis not present

## 2022-02-02 DIAGNOSIS — C4492 Squamous cell carcinoma of skin, unspecified: Secondary | ICD-10-CM | POA: Diagnosis not present

## 2022-02-02 DIAGNOSIS — K219 Gastro-esophageal reflux disease without esophagitis: Secondary | ICD-10-CM | POA: Diagnosis not present

## 2022-02-02 DIAGNOSIS — Z7984 Long term (current) use of oral hypoglycemic drugs: Secondary | ICD-10-CM | POA: Diagnosis not present

## 2022-02-02 DIAGNOSIS — E1122 Type 2 diabetes mellitus with diabetic chronic kidney disease: Secondary | ICD-10-CM | POA: Diagnosis not present

## 2022-02-02 DIAGNOSIS — E7800 Pure hypercholesterolemia, unspecified: Secondary | ICD-10-CM | POA: Diagnosis not present

## 2022-02-02 DIAGNOSIS — N183 Chronic kidney disease, stage 3 unspecified: Secondary | ICD-10-CM | POA: Diagnosis not present

## 2022-02-02 DIAGNOSIS — D492 Neoplasm of unspecified behavior of bone, soft tissue, and skin: Secondary | ICD-10-CM | POA: Diagnosis not present

## 2022-02-02 DIAGNOSIS — Z87891 Personal history of nicotine dependence: Secondary | ICD-10-CM | POA: Diagnosis not present

## 2022-02-02 DIAGNOSIS — I2510 Atherosclerotic heart disease of native coronary artery without angina pectoris: Secondary | ICD-10-CM | POA: Diagnosis not present

## 2022-02-02 DIAGNOSIS — I129 Hypertensive chronic kidney disease with stage 1 through stage 4 chronic kidney disease, or unspecified chronic kidney disease: Secondary | ICD-10-CM | POA: Diagnosis not present

## 2022-02-02 DIAGNOSIS — Z79899 Other long term (current) drug therapy: Secondary | ICD-10-CM | POA: Diagnosis not present

## 2022-02-02 DIAGNOSIS — L989 Disorder of the skin and subcutaneous tissue, unspecified: Secondary | ICD-10-CM | POA: Diagnosis not present

## 2022-02-02 DIAGNOSIS — J189 Pneumonia, unspecified organism: Secondary | ICD-10-CM | POA: Diagnosis not present

## 2022-02-02 DIAGNOSIS — N189 Chronic kidney disease, unspecified: Secondary | ICD-10-CM | POA: Diagnosis not present

## 2022-02-02 DIAGNOSIS — Z951 Presence of aortocoronary bypass graft: Secondary | ICD-10-CM | POA: Diagnosis not present

## 2022-02-02 DIAGNOSIS — C44629 Squamous cell carcinoma of skin of left upper limb, including shoulder: Secondary | ICD-10-CM | POA: Diagnosis not present

## 2022-02-02 DIAGNOSIS — Z7982 Long term (current) use of aspirin: Secondary | ICD-10-CM | POA: Diagnosis not present

## 2022-02-02 DIAGNOSIS — Z955 Presence of coronary angioplasty implant and graft: Secondary | ICD-10-CM | POA: Diagnosis not present

## 2022-02-02 DIAGNOSIS — I252 Old myocardial infarction: Secondary | ICD-10-CM | POA: Diagnosis not present

## 2022-02-02 DIAGNOSIS — L858 Other specified epidermal thickening: Secondary | ICD-10-CM | POA: Diagnosis not present

## 2022-02-11 DIAGNOSIS — E875 Hyperkalemia: Secondary | ICD-10-CM | POA: Diagnosis not present

## 2022-02-11 DIAGNOSIS — I129 Hypertensive chronic kidney disease with stage 1 through stage 4 chronic kidney disease, or unspecified chronic kidney disease: Secondary | ICD-10-CM | POA: Diagnosis not present

## 2022-02-11 DIAGNOSIS — D472 Monoclonal gammopathy: Secondary | ICD-10-CM | POA: Diagnosis not present

## 2022-02-11 DIAGNOSIS — N189 Chronic kidney disease, unspecified: Secondary | ICD-10-CM | POA: Diagnosis not present

## 2022-02-11 DIAGNOSIS — E1122 Type 2 diabetes mellitus with diabetic chronic kidney disease: Secondary | ICD-10-CM | POA: Diagnosis not present

## 2022-02-11 DIAGNOSIS — N200 Calculus of kidney: Secondary | ICD-10-CM | POA: Diagnosis not present

## 2022-02-11 DIAGNOSIS — R7989 Other specified abnormal findings of blood chemistry: Secondary | ICD-10-CM | POA: Diagnosis not present

## 2022-02-17 DIAGNOSIS — C44629 Squamous cell carcinoma of skin of left upper limb, including shoulder: Secondary | ICD-10-CM | POA: Diagnosis not present

## 2022-02-17 DIAGNOSIS — D492 Neoplasm of unspecified behavior of bone, soft tissue, and skin: Secondary | ICD-10-CM | POA: Diagnosis not present

## 2022-02-18 DIAGNOSIS — R809 Proteinuria, unspecified: Secondary | ICD-10-CM | POA: Diagnosis not present

## 2022-02-18 DIAGNOSIS — E1122 Type 2 diabetes mellitus with diabetic chronic kidney disease: Secondary | ICD-10-CM | POA: Diagnosis not present

## 2022-02-18 DIAGNOSIS — E875 Hyperkalemia: Secondary | ICD-10-CM | POA: Diagnosis not present

## 2022-02-18 DIAGNOSIS — E1129 Type 2 diabetes mellitus with other diabetic kidney complication: Secondary | ICD-10-CM | POA: Diagnosis not present

## 2022-02-18 DIAGNOSIS — D472 Monoclonal gammopathy: Secondary | ICD-10-CM | POA: Diagnosis not present

## 2022-02-18 DIAGNOSIS — N200 Calculus of kidney: Secondary | ICD-10-CM | POA: Diagnosis not present

## 2022-02-18 DIAGNOSIS — N189 Chronic kidney disease, unspecified: Secondary | ICD-10-CM | POA: Diagnosis not present

## 2022-02-18 DIAGNOSIS — I129 Hypertensive chronic kidney disease with stage 1 through stage 4 chronic kidney disease, or unspecified chronic kidney disease: Secondary | ICD-10-CM | POA: Diagnosis not present

## 2022-02-18 DIAGNOSIS — Z01818 Encounter for other preprocedural examination: Secondary | ICD-10-CM | POA: Diagnosis not present

## 2022-02-20 DIAGNOSIS — L03119 Cellulitis of unspecified part of limb: Secondary | ICD-10-CM | POA: Diagnosis not present

## 2022-02-20 DIAGNOSIS — Z6830 Body mass index (BMI) 30.0-30.9, adult: Secondary | ICD-10-CM | POA: Diagnosis not present

## 2022-02-26 DIAGNOSIS — E7800 Pure hypercholesterolemia, unspecified: Secondary | ICD-10-CM | POA: Diagnosis not present

## 2022-02-26 DIAGNOSIS — Z6830 Body mass index (BMI) 30.0-30.9, adult: Secondary | ICD-10-CM | POA: Diagnosis not present

## 2022-02-26 DIAGNOSIS — T148XXA Other injury of unspecified body region, initial encounter: Secondary | ICD-10-CM | POA: Diagnosis not present

## 2022-02-26 DIAGNOSIS — N1832 Chronic kidney disease, stage 3b: Secondary | ICD-10-CM | POA: Diagnosis not present

## 2022-02-26 DIAGNOSIS — Z87891 Personal history of nicotine dependence: Secondary | ICD-10-CM | POA: Diagnosis not present

## 2022-02-26 DIAGNOSIS — I2510 Atherosclerotic heart disease of native coronary artery without angina pectoris: Secondary | ICD-10-CM | POA: Diagnosis not present

## 2022-02-26 DIAGNOSIS — Z4802 Encounter for removal of sutures: Secondary | ICD-10-CM | POA: Diagnosis not present

## 2022-02-26 DIAGNOSIS — I129 Hypertensive chronic kidney disease with stage 1 through stage 4 chronic kidney disease, or unspecified chronic kidney disease: Secondary | ICD-10-CM | POA: Diagnosis not present

## 2022-02-26 DIAGNOSIS — N200 Calculus of kidney: Secondary | ICD-10-CM | POA: Diagnosis not present

## 2022-02-26 DIAGNOSIS — E875 Hyperkalemia: Secondary | ICD-10-CM | POA: Diagnosis not present

## 2022-02-26 DIAGNOSIS — M25539 Pain in unspecified wrist: Secondary | ICD-10-CM | POA: Diagnosis not present

## 2022-02-26 DIAGNOSIS — E1122 Type 2 diabetes mellitus with diabetic chronic kidney disease: Secondary | ICD-10-CM | POA: Diagnosis not present

## 2022-02-26 DIAGNOSIS — Z6829 Body mass index (BMI) 29.0-29.9, adult: Secondary | ICD-10-CM | POA: Diagnosis not present

## 2022-02-26 DIAGNOSIS — D472 Monoclonal gammopathy: Secondary | ICD-10-CM | POA: Diagnosis not present

## 2022-02-26 DIAGNOSIS — X58XXXA Exposure to other specified factors, initial encounter: Secondary | ICD-10-CM | POA: Diagnosis not present

## 2022-02-26 DIAGNOSIS — I10 Essential (primary) hypertension: Secondary | ICD-10-CM | POA: Diagnosis not present

## 2022-02-26 DIAGNOSIS — N183 Chronic kidney disease, stage 3 unspecified: Secondary | ICD-10-CM | POA: Diagnosis not present

## 2022-02-26 DIAGNOSIS — E1165 Type 2 diabetes mellitus with hyperglycemia: Secondary | ICD-10-CM | POA: Diagnosis not present

## 2022-02-26 DIAGNOSIS — Z7982 Long term (current) use of aspirin: Secondary | ICD-10-CM | POA: Diagnosis not present

## 2022-02-26 DIAGNOSIS — C44629 Squamous cell carcinoma of skin of left upper limb, including shoulder: Secondary | ICD-10-CM | POA: Diagnosis not present

## 2022-03-01 DIAGNOSIS — C44629 Squamous cell carcinoma of skin of left upper limb, including shoulder: Secondary | ICD-10-CM | POA: Diagnosis not present

## 2022-03-05 ENCOUNTER — Other Ambulatory Visit: Payer: Self-pay | Admitting: *Deleted

## 2022-03-05 NOTE — Patient Outreach (Signed)
[facility]) Care Management  03/05/2022  Derek [name] 1946-09-13 [id number]  Unsuccessful outreach attempt made to patient. Patient answered the phone and stated that he would not be able to speak today. Patient did request that this nurse call back at a later date.   Plan: RN Health Coach will call patient within the month of July.  Derek Loron RN, BSN [place] [phone] [email or web address]

## 2022-03-08 ENCOUNTER — Ambulatory Visit: Payer: Medicare HMO | Admitting: *Deleted

## 2022-03-19 DIAGNOSIS — X58XXXA Exposure to other specified factors, initial encounter: Secondary | ICD-10-CM | POA: Diagnosis not present

## 2022-03-19 DIAGNOSIS — Z87891 Personal history of nicotine dependence: Secondary | ICD-10-CM | POA: Diagnosis not present

## 2022-03-19 DIAGNOSIS — Z4802 Encounter for removal of sutures: Secondary | ICD-10-CM | POA: Diagnosis not present

## 2022-03-19 DIAGNOSIS — N1832 Chronic kidney disease, stage 3b: Secondary | ICD-10-CM | POA: Diagnosis not present

## 2022-03-19 DIAGNOSIS — I129 Hypertensive chronic kidney disease with stage 1 through stage 4 chronic kidney disease, or unspecified chronic kidney disease: Secondary | ICD-10-CM | POA: Diagnosis not present

## 2022-03-19 DIAGNOSIS — E7800 Pure hypercholesterolemia, unspecified: Secondary | ICD-10-CM | POA: Diagnosis not present

## 2022-03-19 DIAGNOSIS — I2510 Atherosclerotic heart disease of native coronary artery without angina pectoris: Secondary | ICD-10-CM | POA: Diagnosis not present

## 2022-03-19 DIAGNOSIS — C44629 Squamous cell carcinoma of skin of left upper limb, including shoulder: Secondary | ICD-10-CM | POA: Diagnosis not present

## 2022-03-19 DIAGNOSIS — E1122 Type 2 diabetes mellitus with diabetic chronic kidney disease: Secondary | ICD-10-CM | POA: Diagnosis not present

## 2022-03-19 DIAGNOSIS — Z7982 Long term (current) use of aspirin: Secondary | ICD-10-CM | POA: Diagnosis not present

## 2022-03-19 DIAGNOSIS — T148XXA Other injury of unspecified body region, initial encounter: Secondary | ICD-10-CM | POA: Diagnosis not present

## 2022-04-08 ENCOUNTER — Other Ambulatory Visit: Payer: Self-pay | Admitting: *Deleted

## 2022-04-08 NOTE — Patient Outreach (Signed)
[facility]) Care Management  04/08/2022  CHI GARLOW 22-Mar-1946 [id number]  Spoke with patient who states he is doing well. He reports his hypertension, diabetes, and CKD is currently under control.Patient has successfully completed his health and wellness goals.   Plan: RN Health Coach will close case.   Emelia Loron RN, BSN [place] [phone] [email or web address]

## 2022-04-12 ENCOUNTER — Ambulatory Visit: Payer: Self-pay | Admitting: *Deleted

## 2022-04-26 DIAGNOSIS — E875 Hyperkalemia: Secondary | ICD-10-CM | POA: Diagnosis not present

## 2022-04-26 DIAGNOSIS — N200 Calculus of kidney: Secondary | ICD-10-CM | POA: Diagnosis not present

## 2022-04-26 DIAGNOSIS — D472 Monoclonal gammopathy: Secondary | ICD-10-CM | POA: Diagnosis not present

## 2022-04-26 DIAGNOSIS — N189 Chronic kidney disease, unspecified: Secondary | ICD-10-CM | POA: Diagnosis not present

## 2022-04-26 DIAGNOSIS — I129 Hypertensive chronic kidney disease with stage 1 through stage 4 chronic kidney disease, or unspecified chronic kidney disease: Secondary | ICD-10-CM | POA: Diagnosis not present

## 2022-04-28 DIAGNOSIS — E1165 Type 2 diabetes mellitus with hyperglycemia: Secondary | ICD-10-CM | POA: Diagnosis not present

## 2022-04-28 DIAGNOSIS — N200 Calculus of kidney: Secondary | ICD-10-CM | POA: Diagnosis not present

## 2022-04-28 DIAGNOSIS — M25539 Pain in unspecified wrist: Secondary | ICD-10-CM | POA: Diagnosis not present

## 2022-04-28 DIAGNOSIS — Z6837 Body mass index (BMI) 37.0-37.9, adult: Secondary | ICD-10-CM | POA: Diagnosis not present

## 2022-04-28 DIAGNOSIS — E875 Hyperkalemia: Secondary | ICD-10-CM | POA: Diagnosis not present

## 2022-04-28 DIAGNOSIS — D472 Monoclonal gammopathy: Secondary | ICD-10-CM | POA: Diagnosis not present

## 2022-04-28 DIAGNOSIS — I10 Essential (primary) hypertension: Secondary | ICD-10-CM | POA: Diagnosis not present

## 2022-04-28 DIAGNOSIS — N183 Chronic kidney disease, stage 3 unspecified: Secondary | ICD-10-CM | POA: Diagnosis not present

## 2022-05-03 ENCOUNTER — Other Ambulatory Visit: Payer: Self-pay | Admitting: *Deleted

## 2022-05-03 ENCOUNTER — Encounter: Payer: Self-pay | Admitting: *Deleted

## 2022-05-03 NOTE — Patient Outreach (Signed)
  Care Coordination   Initial Visit Note   05/03/2022 Name: Derek [name] MRN: [id number] DOB: Oct 10, 1945  Derek [name] is a 76 y.o. year old male who sees Graball Nation, MD for primary care. I spoke with  Derek [name] by phone today  What matters to the patients health and wellness today?  Member feels that he is managing chronic conditions well.  Blood pressure range 130s/60, and blood sugar range 100-110's with A1C less than 7.  He and his wife has enrolled with [employer] in effort to maintain health.  Aware of need for AWV, last seen PCP last week for routine follow up.    SDOH assessments and interventions completed:  Yes  SDOH Interventions Today    Flowsheet Row Most Recent Value  SDOH Interventions   Food Insecurity Interventions Intervention Not Indicated  Housing Interventions Intervention Not Indicated  Transportation Interventions Intervention Not Indicated        Care Coordination Interventions Activated:  Yes  Care Coordination Interventions:  Yes, provided   Follow up plan: No further intervention required.   Encounter Outcome:  Pt. Visit Completed   Valente David, RN, MSN, [facility] Care Coordinator [phone]

## 2022-05-05 DIAGNOSIS — E1122 Type 2 diabetes mellitus with diabetic chronic kidney disease: Secondary | ICD-10-CM | POA: Diagnosis not present

## 2022-05-05 DIAGNOSIS — E860 Dehydration: Secondary | ICD-10-CM | POA: Diagnosis not present

## 2022-05-10 DIAGNOSIS — I10 Essential (primary) hypertension: Secondary | ICD-10-CM | POA: Diagnosis not present

## 2022-05-10 DIAGNOSIS — D472 Monoclonal gammopathy: Secondary | ICD-10-CM | POA: Diagnosis not present

## 2022-05-10 DIAGNOSIS — M25539 Pain in unspecified wrist: Secondary | ICD-10-CM | POA: Diagnosis not present

## 2022-05-10 DIAGNOSIS — N200 Calculus of kidney: Secondary | ICD-10-CM | POA: Diagnosis not present

## 2022-05-10 DIAGNOSIS — Z6838 Body mass index (BMI) 38.0-38.9, adult: Secondary | ICD-10-CM | POA: Diagnosis not present

## 2022-05-10 DIAGNOSIS — R911 Solitary pulmonary nodule: Secondary | ICD-10-CM | POA: Diagnosis not present

## 2022-05-10 DIAGNOSIS — N183 Chronic kidney disease, stage 3 unspecified: Secondary | ICD-10-CM | POA: Diagnosis not present

## 2022-05-10 DIAGNOSIS — E1165 Type 2 diabetes mellitus with hyperglycemia: Secondary | ICD-10-CM | POA: Diagnosis not present

## 2022-05-10 DIAGNOSIS — E875 Hyperkalemia: Secondary | ICD-10-CM | POA: Diagnosis not present

## 2022-05-14 DIAGNOSIS — R911 Solitary pulmonary nodule: Secondary | ICD-10-CM | POA: Diagnosis not present

## 2022-05-14 DIAGNOSIS — I700 Atherosclerosis of aorta: Secondary | ICD-10-CM | POA: Diagnosis not present

## 2022-05-25 DIAGNOSIS — R911 Solitary pulmonary nodule: Secondary | ICD-10-CM | POA: Diagnosis not present

## 2022-05-25 DIAGNOSIS — D472 Monoclonal gammopathy: Secondary | ICD-10-CM | POA: Diagnosis not present

## 2022-05-25 DIAGNOSIS — E875 Hyperkalemia: Secondary | ICD-10-CM | POA: Diagnosis not present

## 2022-05-25 DIAGNOSIS — N183 Chronic kidney disease, stage 3 unspecified: Secondary | ICD-10-CM | POA: Diagnosis not present

## 2022-05-25 DIAGNOSIS — I10 Essential (primary) hypertension: Secondary | ICD-10-CM | POA: Diagnosis not present

## 2022-05-25 DIAGNOSIS — Z6837 Body mass index (BMI) 37.0-37.9, adult: Secondary | ICD-10-CM | POA: Diagnosis not present

## 2022-05-25 DIAGNOSIS — N200 Calculus of kidney: Secondary | ICD-10-CM | POA: Diagnosis not present

## 2022-05-25 DIAGNOSIS — E1165 Type 2 diabetes mellitus with hyperglycemia: Secondary | ICD-10-CM | POA: Diagnosis not present

## 2022-05-25 DIAGNOSIS — M25539 Pain in unspecified wrist: Secondary | ICD-10-CM | POA: Diagnosis not present

## 2022-05-26 ENCOUNTER — Encounter: Payer: Self-pay | Admitting: *Deleted

## 2022-05-26 DIAGNOSIS — R809 Proteinuria, unspecified: Secondary | ICD-10-CM | POA: Diagnosis not present

## 2022-05-26 DIAGNOSIS — D472 Monoclonal gammopathy: Secondary | ICD-10-CM | POA: Diagnosis not present

## 2022-05-26 DIAGNOSIS — E875 Hyperkalemia: Secondary | ICD-10-CM | POA: Diagnosis not present

## 2022-05-26 DIAGNOSIS — E870 Hyperosmolality and hypernatremia: Secondary | ICD-10-CM | POA: Diagnosis not present

## 2022-05-26 DIAGNOSIS — N200 Calculus of kidney: Secondary | ICD-10-CM | POA: Diagnosis not present

## 2022-05-26 DIAGNOSIS — N189 Chronic kidney disease, unspecified: Secondary | ICD-10-CM | POA: Diagnosis not present

## 2022-05-26 DIAGNOSIS — E1129 Type 2 diabetes mellitus with other diabetic kidney complication: Secondary | ICD-10-CM | POA: Diagnosis not present

## 2022-05-26 DIAGNOSIS — E1122 Type 2 diabetes mellitus with diabetic chronic kidney disease: Secondary | ICD-10-CM | POA: Diagnosis not present

## 2022-05-26 DIAGNOSIS — I129 Hypertensive chronic kidney disease with stage 1 through stage 4 chronic kidney disease, or unspecified chronic kidney disease: Secondary | ICD-10-CM | POA: Diagnosis not present

## 2022-05-31 DIAGNOSIS — E782 Mixed hyperlipidemia: Secondary | ICD-10-CM | POA: Diagnosis not present

## 2022-05-31 DIAGNOSIS — I25810 Atherosclerosis of coronary artery bypass graft(s) without angina pectoris: Secondary | ICD-10-CM | POA: Diagnosis not present

## 2022-05-31 DIAGNOSIS — E875 Hyperkalemia: Secondary | ICD-10-CM | POA: Diagnosis not present

## 2022-05-31 DIAGNOSIS — I10 Essential (primary) hypertension: Secondary | ICD-10-CM | POA: Diagnosis not present

## 2022-05-31 DIAGNOSIS — N1832 Chronic kidney disease, stage 3b: Secondary | ICD-10-CM | POA: Diagnosis not present

## 2022-06-03 DIAGNOSIS — E875 Hyperkalemia: Secondary | ICD-10-CM | POA: Diagnosis not present

## 2022-06-09 ENCOUNTER — Encounter: Payer: Self-pay | Admitting: *Deleted

## 2022-06-10 ENCOUNTER — Ambulatory Visit: Payer: Medicare HMO | Admitting: Cardiology

## 2022-07-23 DIAGNOSIS — I129 Hypertensive chronic kidney disease with stage 1 through stage 4 chronic kidney disease, or unspecified chronic kidney disease: Secondary | ICD-10-CM | POA: Diagnosis not present

## 2022-07-23 DIAGNOSIS — E870 Hyperosmolality and hypernatremia: Secondary | ICD-10-CM | POA: Diagnosis not present

## 2022-07-23 DIAGNOSIS — N189 Chronic kidney disease, unspecified: Secondary | ICD-10-CM | POA: Diagnosis not present

## 2022-07-23 DIAGNOSIS — E1129 Type 2 diabetes mellitus with other diabetic kidney complication: Secondary | ICD-10-CM | POA: Diagnosis not present

## 2022-07-23 DIAGNOSIS — D472 Monoclonal gammopathy: Secondary | ICD-10-CM | POA: Diagnosis not present

## 2022-07-23 DIAGNOSIS — R809 Proteinuria, unspecified: Secondary | ICD-10-CM | POA: Diagnosis not present

## 2022-07-23 DIAGNOSIS — E875 Hyperkalemia: Secondary | ICD-10-CM | POA: Diagnosis not present

## 2022-07-23 DIAGNOSIS — N200 Calculus of kidney: Secondary | ICD-10-CM | POA: Diagnosis not present

## 2022-07-23 DIAGNOSIS — E559 Vitamin D deficiency, unspecified: Secondary | ICD-10-CM | POA: Diagnosis not present

## 2022-07-26 DIAGNOSIS — I10 Essential (primary) hypertension: Secondary | ICD-10-CM | POA: Diagnosis not present

## 2022-07-26 DIAGNOSIS — N200 Calculus of kidney: Secondary | ICD-10-CM | POA: Diagnosis not present

## 2022-07-26 DIAGNOSIS — N183 Chronic kidney disease, stage 3 unspecified: Secondary | ICD-10-CM | POA: Diagnosis not present

## 2022-07-26 DIAGNOSIS — D472 Monoclonal gammopathy: Secondary | ICD-10-CM | POA: Diagnosis not present

## 2022-07-26 DIAGNOSIS — E875 Hyperkalemia: Secondary | ICD-10-CM | POA: Diagnosis not present

## 2022-07-26 DIAGNOSIS — M25539 Pain in unspecified wrist: Secondary | ICD-10-CM | POA: Diagnosis not present

## 2022-07-26 DIAGNOSIS — E1165 Type 2 diabetes mellitus with hyperglycemia: Secondary | ICD-10-CM | POA: Diagnosis not present

## 2022-07-26 DIAGNOSIS — Z6838 Body mass index (BMI) 38.0-38.9, adult: Secondary | ICD-10-CM | POA: Diagnosis not present

## 2022-08-03 DIAGNOSIS — N183 Chronic kidney disease, stage 3 unspecified: Secondary | ICD-10-CM | POA: Diagnosis not present

## 2022-08-03 DIAGNOSIS — E119 Type 2 diabetes mellitus without complications: Secondary | ICD-10-CM | POA: Diagnosis not present

## 2022-08-14 DIAGNOSIS — E875 Hyperkalemia: Secondary | ICD-10-CM | POA: Diagnosis not present

## 2022-08-14 DIAGNOSIS — I129 Hypertensive chronic kidney disease with stage 1 through stage 4 chronic kidney disease, or unspecified chronic kidney disease: Secondary | ICD-10-CM | POA: Diagnosis not present

## 2022-08-14 DIAGNOSIS — N200 Calculus of kidney: Secondary | ICD-10-CM | POA: Diagnosis not present

## 2022-08-14 DIAGNOSIS — D472 Monoclonal gammopathy: Secondary | ICD-10-CM | POA: Diagnosis not present

## 2022-08-14 DIAGNOSIS — E1122 Type 2 diabetes mellitus with diabetic chronic kidney disease: Secondary | ICD-10-CM | POA: Diagnosis not present

## 2022-08-14 DIAGNOSIS — N189 Chronic kidney disease, unspecified: Secondary | ICD-10-CM | POA: Diagnosis not present

## 2022-08-14 DIAGNOSIS — E1129 Type 2 diabetes mellitus with other diabetic kidney complication: Secondary | ICD-10-CM | POA: Diagnosis not present

## 2022-08-14 DIAGNOSIS — R809 Proteinuria, unspecified: Secondary | ICD-10-CM | POA: Diagnosis not present

## 2022-08-14 DIAGNOSIS — E870 Hyperosmolality and hypernatremia: Secondary | ICD-10-CM | POA: Diagnosis not present

## 2022-08-20 DIAGNOSIS — N200 Calculus of kidney: Secondary | ICD-10-CM | POA: Diagnosis not present

## 2022-08-20 DIAGNOSIS — E875 Hyperkalemia: Secondary | ICD-10-CM | POA: Diagnosis not present

## 2022-08-20 DIAGNOSIS — E1122 Type 2 diabetes mellitus with diabetic chronic kidney disease: Secondary | ICD-10-CM | POA: Diagnosis not present

## 2022-08-20 DIAGNOSIS — N189 Chronic kidney disease, unspecified: Secondary | ICD-10-CM | POA: Diagnosis not present

## 2022-09-28 DIAGNOSIS — E1165 Type 2 diabetes mellitus with hyperglycemia: Secondary | ICD-10-CM | POA: Diagnosis not present

## 2022-09-28 DIAGNOSIS — Z6837 Body mass index (BMI) 37.0-37.9, adult: Secondary | ICD-10-CM | POA: Diagnosis not present

## 2022-09-28 DIAGNOSIS — Z0001 Encounter for general adult medical examination with abnormal findings: Secondary | ICD-10-CM | POA: Diagnosis not present

## 2022-09-28 DIAGNOSIS — N200 Calculus of kidney: Secondary | ICD-10-CM | POA: Diagnosis not present

## 2022-09-28 DIAGNOSIS — M25539 Pain in unspecified wrist: Secondary | ICD-10-CM | POA: Diagnosis not present

## 2022-09-28 DIAGNOSIS — I10 Essential (primary) hypertension: Secondary | ICD-10-CM | POA: Diagnosis not present

## 2022-09-30 IMAGING — US US RENAL
1 series · 13 of 25 positions shown · non-contrast
Comparison: Renal ultrasound February 04, 2020; CT abdomen and pelvis
November 02, 2019

CLINICAL DATA: Nephrolithiasis

EXAM:
RENAL / URINARY TRACT ULTRASOUND COMPLETE

[Series 1: us renal · 13 of 64 slices shown]
[im 1/64]
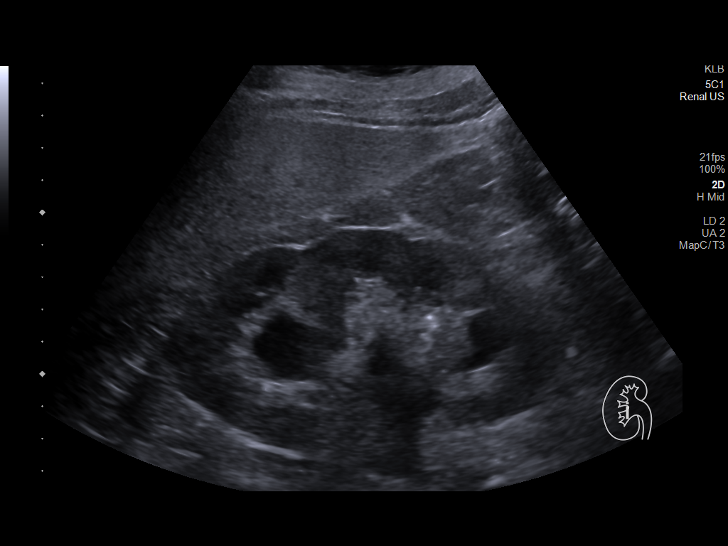
[im 6/64]
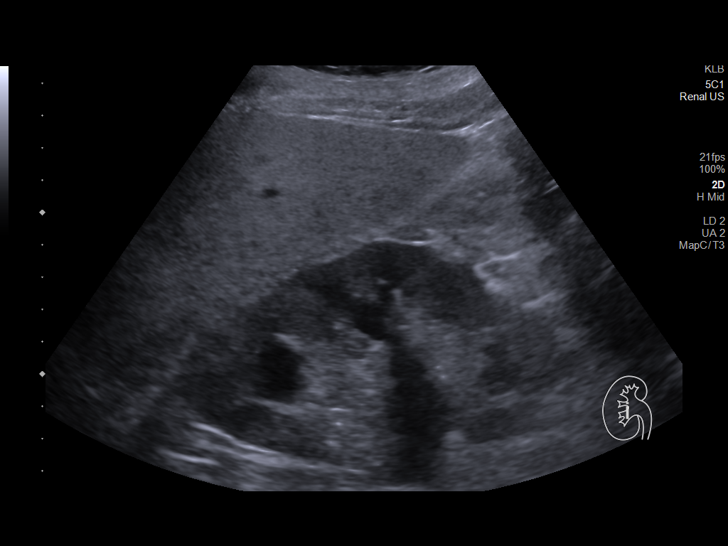
[im 11/64]
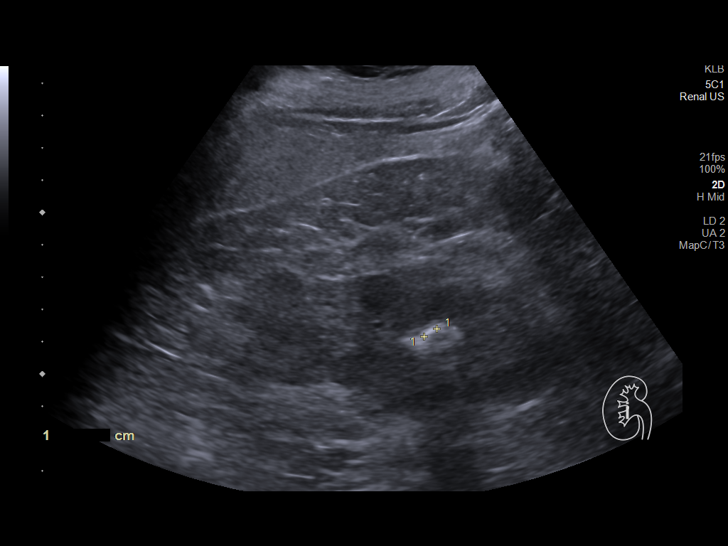
[im 16/64]
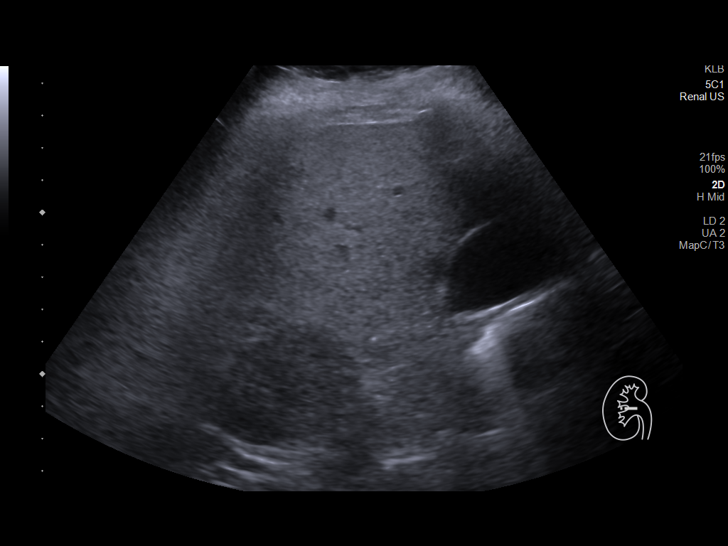
[im 22/64]
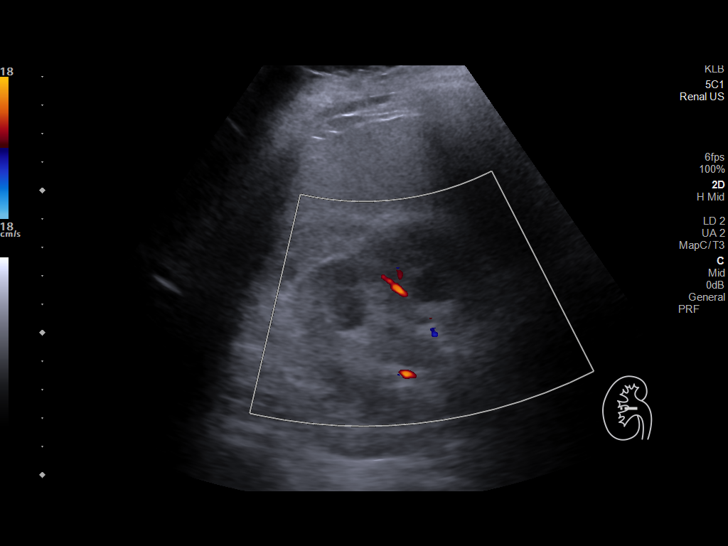
[im 27/64]
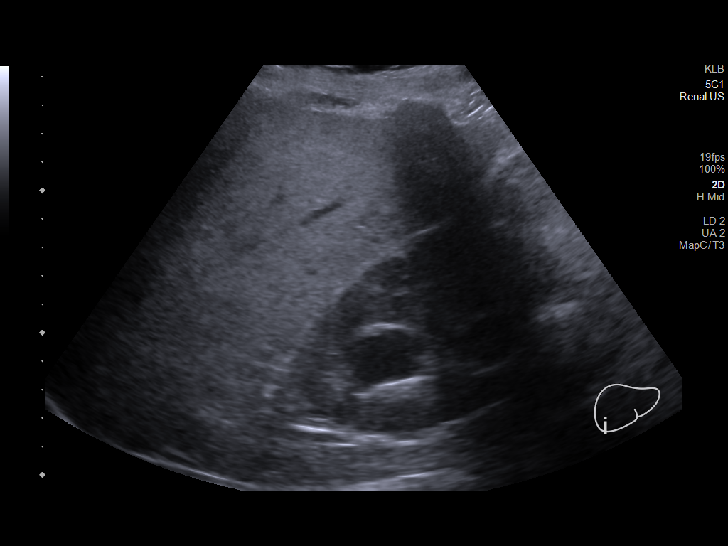
[im 32/64]
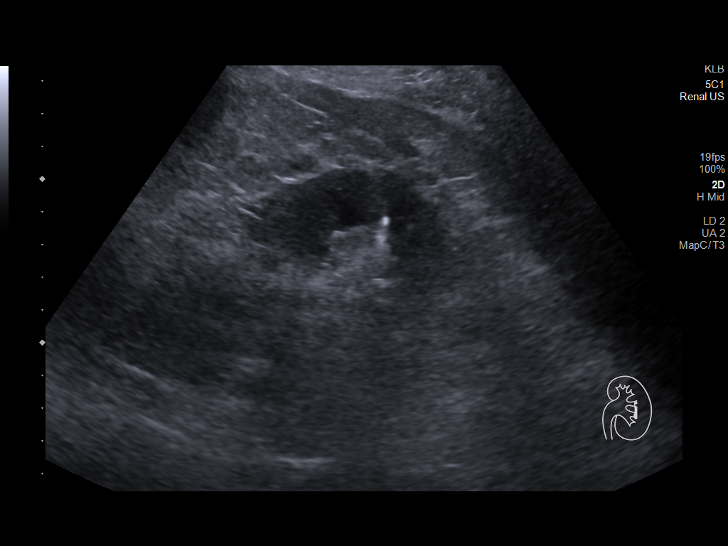
[im 37/64]
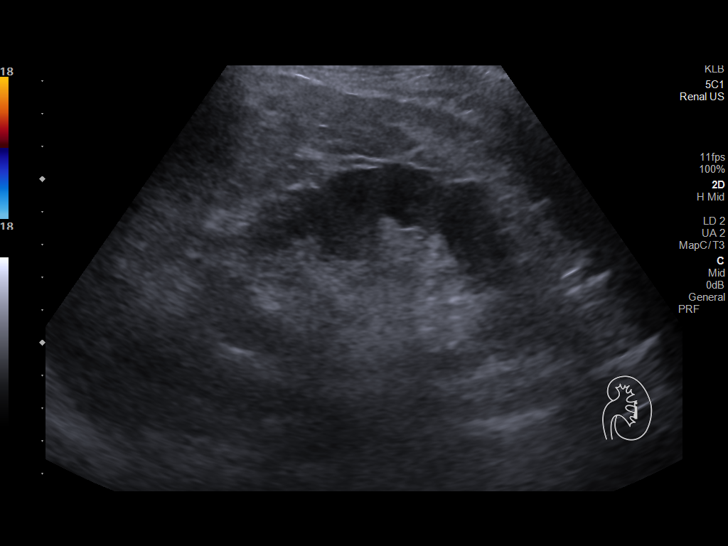
[im 43/64]
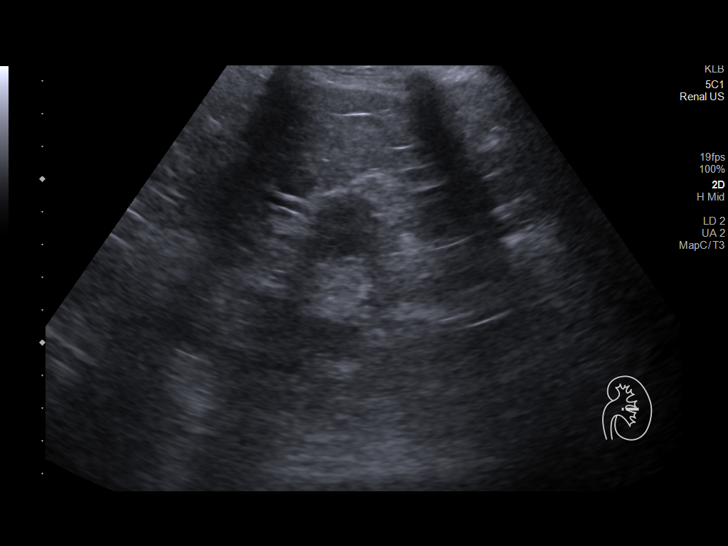
[im 48/64]
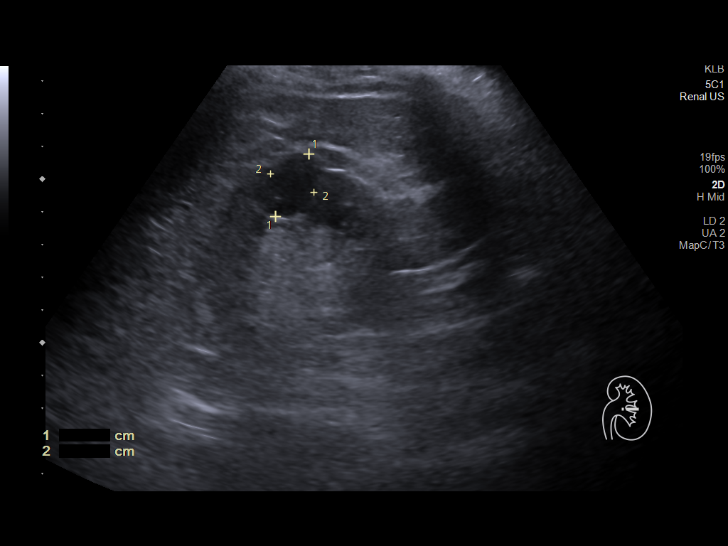
[im 53/64]
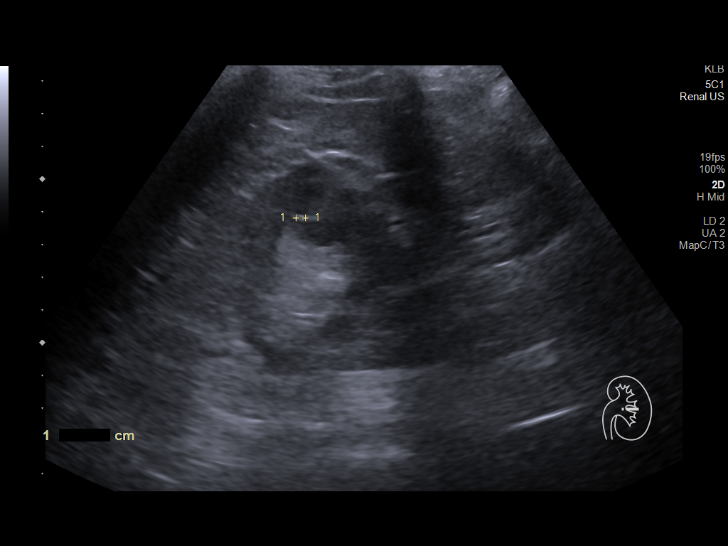
[im 58/64]
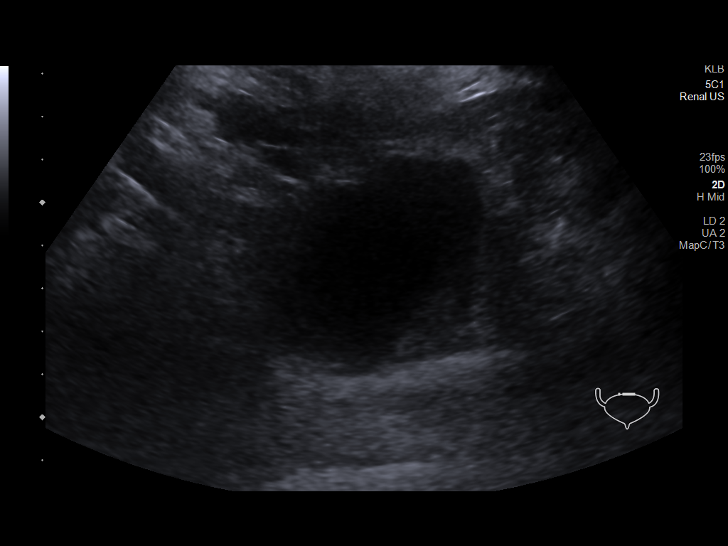
[im 64/64]
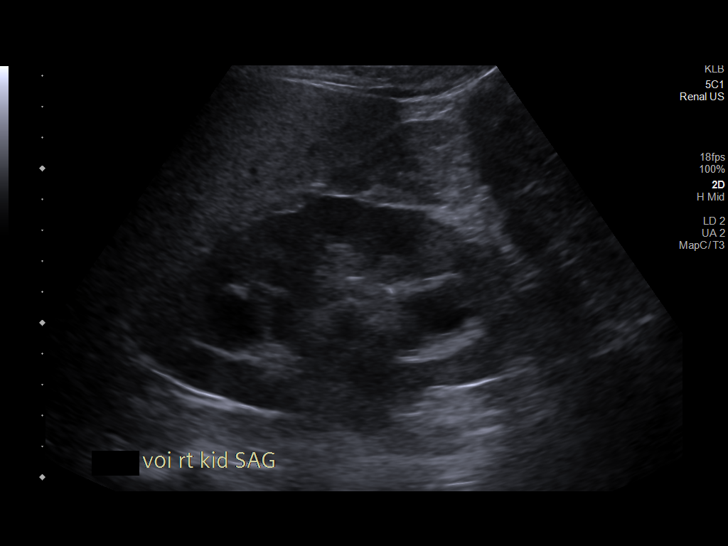

[13 of 25 positions shown; findings below may reference images not displayed]

FINDINGS: Right Kidney:

Renal measurements: 12.3 x 6.9 x 5.9 cm = volume: 261 mL.
Echogenicity and renal cortical thickness are within normal limits.
No mass or perinephric fluid. There is dilatation of apparent upper
to mid pole calices with questionable mild dilatation in the pelvis
versus parapelvic cyst with mild debris. There are calculi in the
right kidney, largest measuring 5 mm. No ureterectasis.

Left Kidney:

Renal measurements: 12.4 x 6.2 x 5.1 cm = volume: 204 mL.
Echogenicity and renal cortical thickness within normal limits. No
perinephric fluid. There is a cyst in the mid left kidney measuring
1.4 x 2.1 x 1.4 cm. No hydronephrosis is appreciable currently.
There are scattered calculi in the left kidney, largest measuring 4
mm. No ureterectasis.

Bladder:

Appears normal for degree of bladder distention. Flow from each
distal ureter seen in the bladder.

Other:

None.
IMPRESSION: 1.  There are calculi in each kidney.

2. Suspect upper and mid kidney infundibular stenosis with
dilatation of these areas on the right. Question debris containing
parapelvic cyst versus mild pelviectasis on the right. No
ureterectasis.

3.  Cyst left kidney measuring 1.4 x 2.1 x 1.4 cm.

## 2022-10-19 DIAGNOSIS — E1129 Type 2 diabetes mellitus with other diabetic kidney complication: Secondary | ICD-10-CM | POA: Diagnosis not present

## 2022-10-19 DIAGNOSIS — N183 Chronic kidney disease, stage 3 unspecified: Secondary | ICD-10-CM | POA: Diagnosis not present

## 2022-10-20 DIAGNOSIS — I10 Essential (primary) hypertension: Secondary | ICD-10-CM | POA: Diagnosis not present

## 2022-10-20 DIAGNOSIS — I129 Hypertensive chronic kidney disease with stage 1 through stage 4 chronic kidney disease, or unspecified chronic kidney disease: Secondary | ICD-10-CM | POA: Diagnosis not present

## 2022-10-20 DIAGNOSIS — E785 Hyperlipidemia, unspecified: Secondary | ICD-10-CM | POA: Diagnosis not present

## 2022-11-08 DIAGNOSIS — R7989 Other specified abnormal findings of blood chemistry: Secondary | ICD-10-CM | POA: Diagnosis not present

## 2022-11-08 DIAGNOSIS — N183 Chronic kidney disease, stage 3 unspecified: Secondary | ICD-10-CM | POA: Diagnosis not present

## 2022-11-08 DIAGNOSIS — E875 Hyperkalemia: Secondary | ICD-10-CM | POA: Diagnosis not present

## 2022-11-29 DIAGNOSIS — I25810 Atherosclerosis of coronary artery bypass graft(s) without angina pectoris: Secondary | ICD-10-CM | POA: Diagnosis not present

## 2022-11-29 DIAGNOSIS — E782 Mixed hyperlipidemia: Secondary | ICD-10-CM | POA: Diagnosis not present

## 2022-11-29 DIAGNOSIS — I10 Essential (primary) hypertension: Secondary | ICD-10-CM | POA: Diagnosis not present

## 2022-12-01 DIAGNOSIS — Z0001 Encounter for general adult medical examination with abnormal findings: Secondary | ICD-10-CM | POA: Diagnosis not present

## 2022-12-01 DIAGNOSIS — R7989 Other specified abnormal findings of blood chemistry: Secondary | ICD-10-CM | POA: Diagnosis not present

## 2022-12-01 DIAGNOSIS — E7801 Familial hypercholesterolemia: Secondary | ICD-10-CM | POA: Diagnosis not present

## 2022-12-01 DIAGNOSIS — E785 Hyperlipidemia, unspecified: Secondary | ICD-10-CM | POA: Diagnosis not present

## 2022-12-01 DIAGNOSIS — R911 Solitary pulmonary nodule: Secondary | ICD-10-CM | POA: Diagnosis not present

## 2022-12-01 DIAGNOSIS — E7800 Pure hypercholesterolemia, unspecified: Secondary | ICD-10-CM | POA: Diagnosis not present

## 2022-12-01 DIAGNOSIS — I129 Hypertensive chronic kidney disease with stage 1 through stage 4 chronic kidney disease, or unspecified chronic kidney disease: Secondary | ICD-10-CM | POA: Diagnosis not present

## 2022-12-01 DIAGNOSIS — E870 Hyperosmolality and hypernatremia: Secondary | ICD-10-CM | POA: Diagnosis not present

## 2022-12-01 DIAGNOSIS — Z1329 Encounter for screening for other suspected endocrine disorder: Secondary | ICD-10-CM | POA: Diagnosis not present

## 2022-12-01 DIAGNOSIS — K219 Gastro-esophageal reflux disease without esophagitis: Secondary | ICD-10-CM | POA: Diagnosis not present

## 2022-12-01 DIAGNOSIS — E559 Vitamin D deficiency, unspecified: Secondary | ICD-10-CM | POA: Diagnosis not present

## 2022-12-01 DIAGNOSIS — E1122 Type 2 diabetes mellitus with diabetic chronic kidney disease: Secondary | ICD-10-CM | POA: Diagnosis not present

## 2022-12-01 DIAGNOSIS — N183 Chronic kidney disease, stage 3 unspecified: Secondary | ICD-10-CM | POA: Diagnosis not present

## 2022-12-07 DIAGNOSIS — E875 Hyperkalemia: Secondary | ICD-10-CM | POA: Diagnosis not present

## 2022-12-07 DIAGNOSIS — E1129 Type 2 diabetes mellitus with other diabetic kidney complication: Secondary | ICD-10-CM | POA: Diagnosis not present

## 2022-12-07 DIAGNOSIS — E1122 Type 2 diabetes mellitus with diabetic chronic kidney disease: Secondary | ICD-10-CM | POA: Diagnosis not present

## 2022-12-07 DIAGNOSIS — I129 Hypertensive chronic kidney disease with stage 1 through stage 4 chronic kidney disease, or unspecified chronic kidney disease: Secondary | ICD-10-CM | POA: Diagnosis not present

## 2022-12-07 DIAGNOSIS — R809 Proteinuria, unspecified: Secondary | ICD-10-CM | POA: Diagnosis not present

## 2022-12-07 DIAGNOSIS — N200 Calculus of kidney: Secondary | ICD-10-CM | POA: Diagnosis not present

## 2022-12-07 DIAGNOSIS — D472 Monoclonal gammopathy: Secondary | ICD-10-CM | POA: Diagnosis not present

## 2022-12-07 DIAGNOSIS — N189 Chronic kidney disease, unspecified: Secondary | ICD-10-CM | POA: Diagnosis not present

## 2022-12-08 DIAGNOSIS — D472 Monoclonal gammopathy: Secondary | ICD-10-CM | POA: Diagnosis not present

## 2022-12-08 DIAGNOSIS — M25539 Pain in unspecified wrist: Secondary | ICD-10-CM | POA: Diagnosis not present

## 2022-12-08 DIAGNOSIS — Z0001 Encounter for general adult medical examination with abnormal findings: Secondary | ICD-10-CM | POA: Diagnosis not present

## 2022-12-08 DIAGNOSIS — Z6838 Body mass index (BMI) 38.0-38.9, adult: Secondary | ICD-10-CM | POA: Diagnosis not present

## 2022-12-08 DIAGNOSIS — E875 Hyperkalemia: Secondary | ICD-10-CM | POA: Diagnosis not present

## 2022-12-08 DIAGNOSIS — E1165 Type 2 diabetes mellitus with hyperglycemia: Secondary | ICD-10-CM | POA: Diagnosis not present

## 2022-12-08 DIAGNOSIS — N200 Calculus of kidney: Secondary | ICD-10-CM | POA: Diagnosis not present

## 2022-12-08 DIAGNOSIS — N183 Chronic kidney disease, stage 3 unspecified: Secondary | ICD-10-CM | POA: Diagnosis not present

## 2022-12-08 DIAGNOSIS — R911 Solitary pulmonary nodule: Secondary | ICD-10-CM | POA: Diagnosis not present

## 2022-12-08 DIAGNOSIS — I10 Essential (primary) hypertension: Secondary | ICD-10-CM | POA: Diagnosis not present

## 2022-12-14 DIAGNOSIS — I10 Essential (primary) hypertension: Secondary | ICD-10-CM | POA: Diagnosis not present

## 2022-12-14 DIAGNOSIS — Z0001 Encounter for general adult medical examination with abnormal findings: Secondary | ICD-10-CM | POA: Diagnosis not present

## 2022-12-14 DIAGNOSIS — I129 Hypertensive chronic kidney disease with stage 1 through stage 4 chronic kidney disease, or unspecified chronic kidney disease: Secondary | ICD-10-CM | POA: Diagnosis not present

## 2022-12-14 DIAGNOSIS — E785 Hyperlipidemia, unspecified: Secondary | ICD-10-CM | POA: Diagnosis not present

## 2022-12-14 DIAGNOSIS — D638 Anemia in other chronic diseases classified elsewhere: Secondary | ICD-10-CM | POA: Diagnosis not present

## 2022-12-14 DIAGNOSIS — N183 Chronic kidney disease, stage 3 unspecified: Secondary | ICD-10-CM | POA: Diagnosis not present

## 2022-12-14 DIAGNOSIS — E1122 Type 2 diabetes mellitus with diabetic chronic kidney disease: Secondary | ICD-10-CM | POA: Diagnosis not present

## 2022-12-21 DIAGNOSIS — Z23 Encounter for immunization: Secondary | ICD-10-CM | POA: Diagnosis not present

## 2022-12-22 IMAGING — CT CT RENAL STONE PROTOCOL
2 of 4 series · 16 of 46 positions shown, 18 images · non-contrast
Comparison: 11/02/2019

CLINICAL DATA: Flank pain.  Nephrolithiasis.

EXAM:
CT ABDOMEN AND PELVIS WITHOUT CONTRAST
TECHNIQUE: Multidetector CT imaging of the abdomen and pelvis was performed
following the standard protocol without IV contrast.

[Series 2: axial st · axial · 0.83mm/px · z∈[+649,+1079]mm · 13 of 98 slices shown, 15 images]
[im 6/98  soft-tissue]
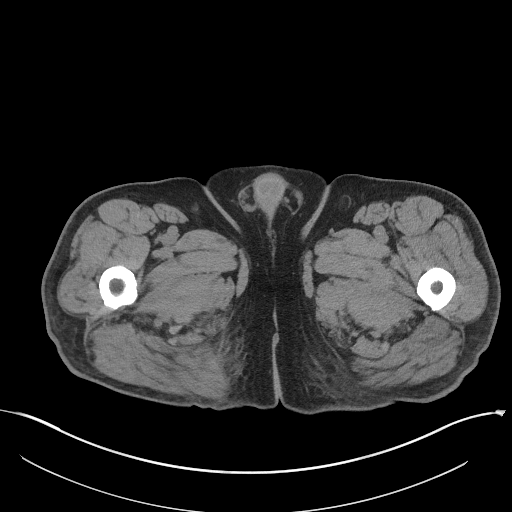
[im 6/98  bone]
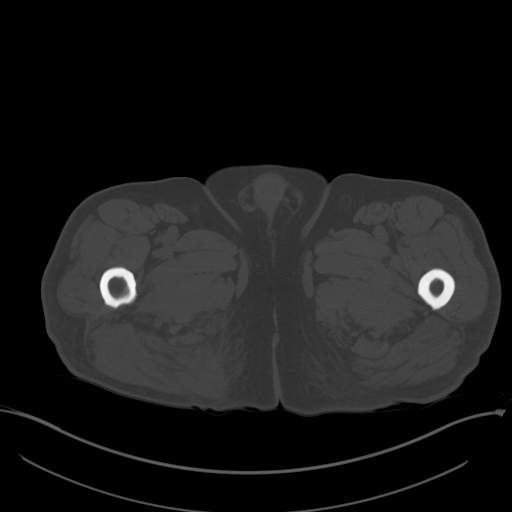
[im 11/98  soft-tissue]
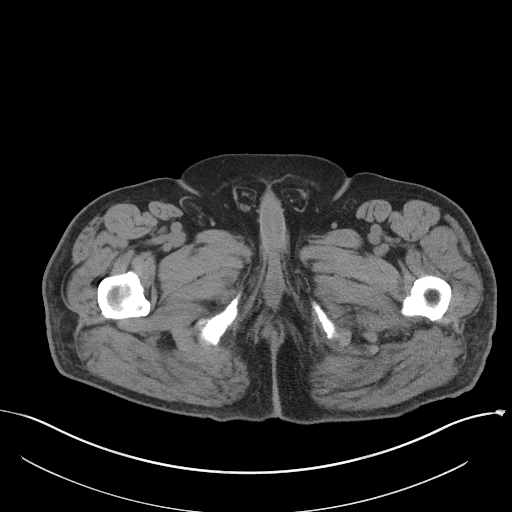
[im 22/98  soft-tissue]
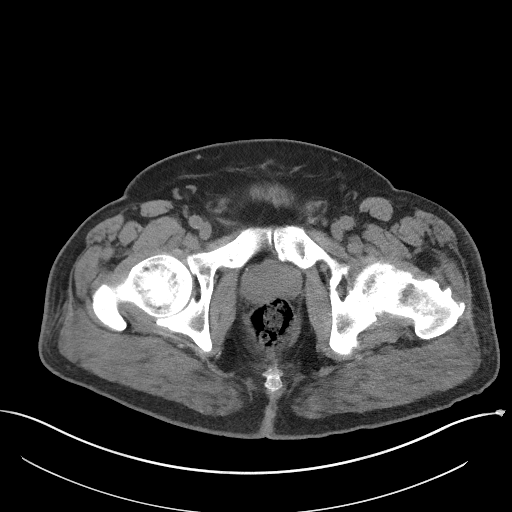
[im 27/98  soft-tissue]
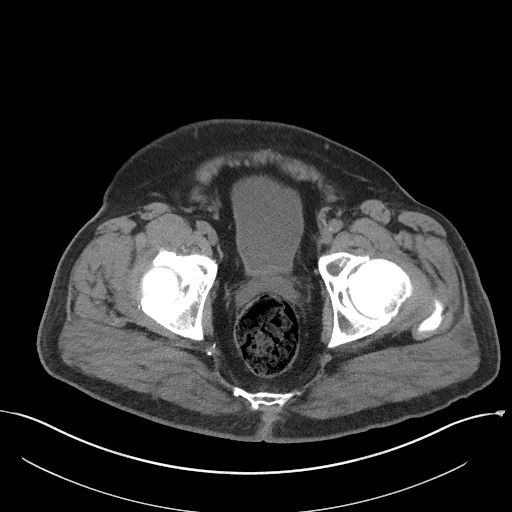
[im 33/98  soft-tissue]
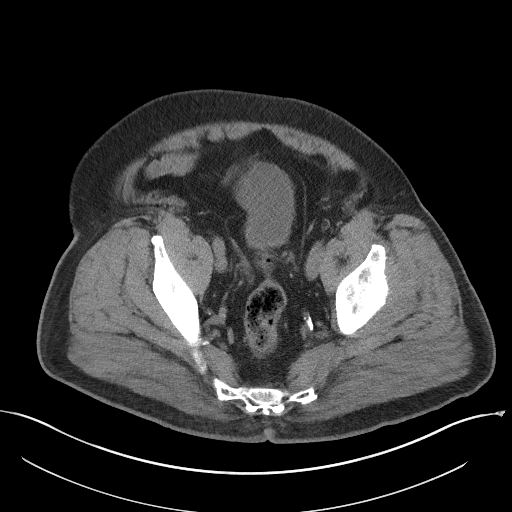
[im 44/98  soft-tissue]
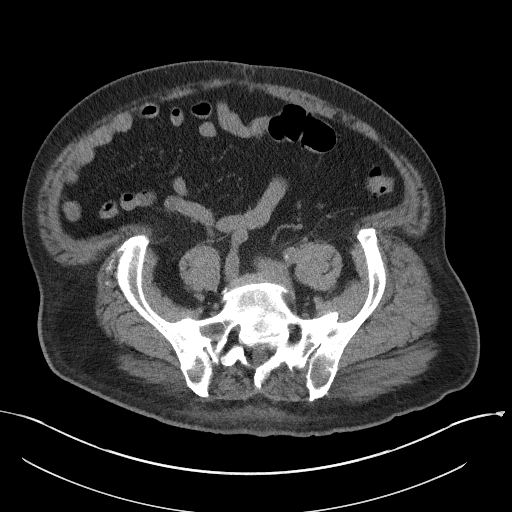
[im 49/98  soft-tissue]
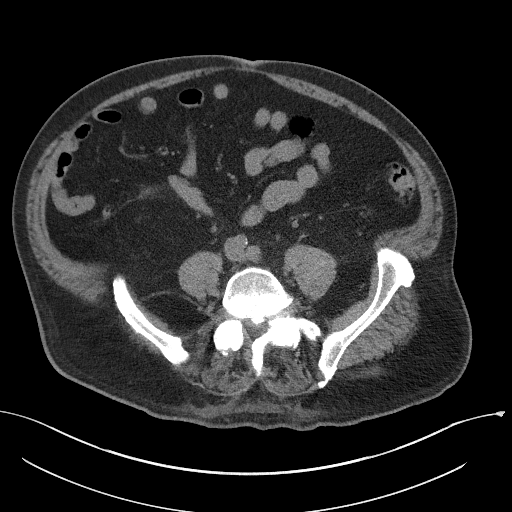
[im 54/98  soft-tissue]
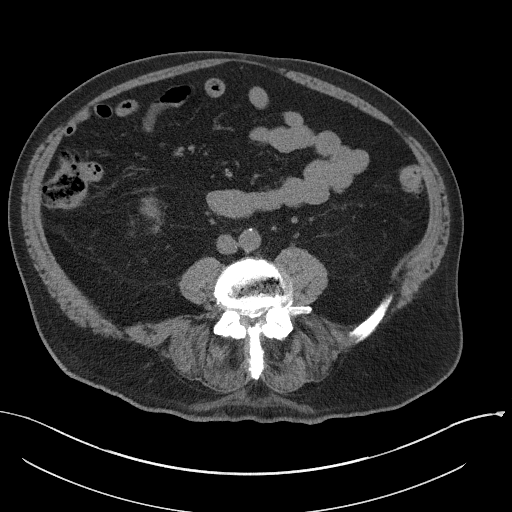
[im 65/98  soft-tissue]
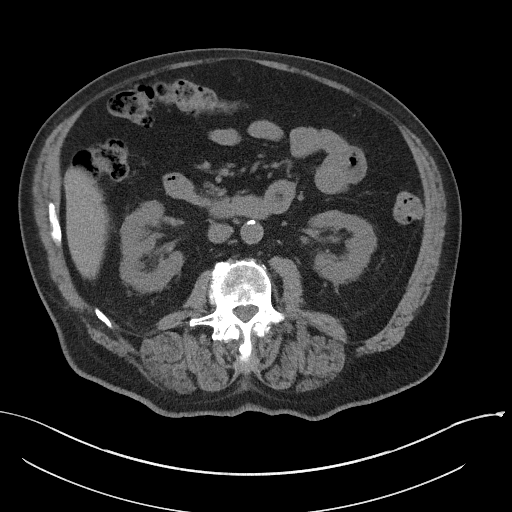
[im 65/98  bone]
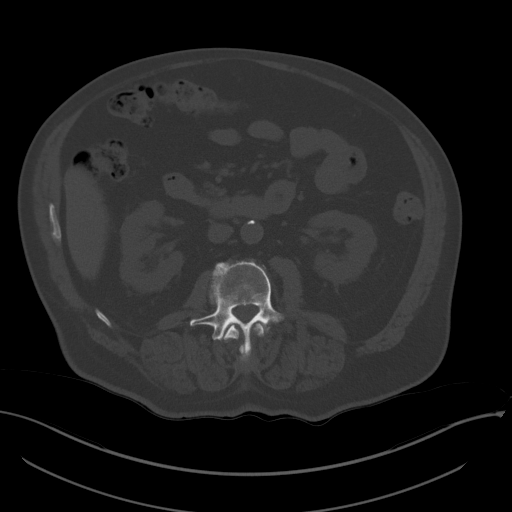
[im 71/98  soft-tissue]
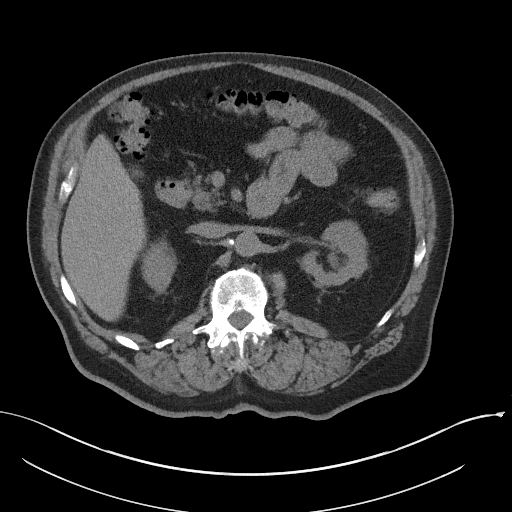
[im 76/98  soft-tissue]
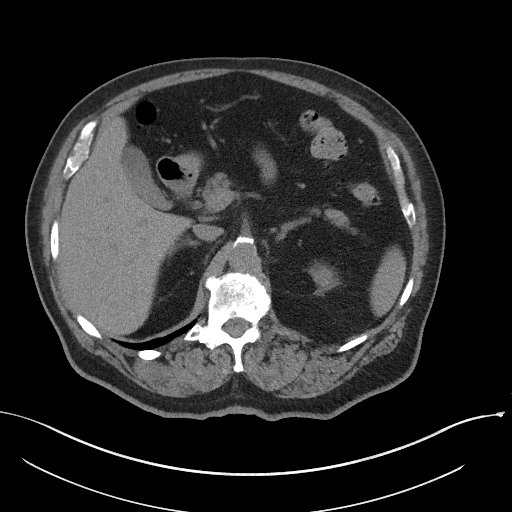
[im 87/98  soft-tissue]
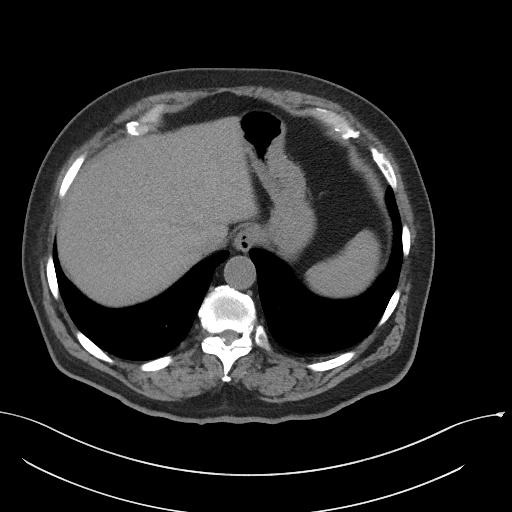
[im 92/98  soft-tissue]
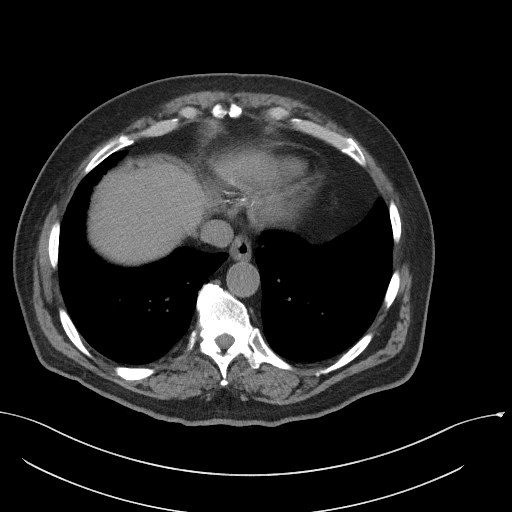

[Series 5: coronal st · coronal · 0.81mm/px · 3 of 110 slices shown]
[im 37/110  soft-tissue]
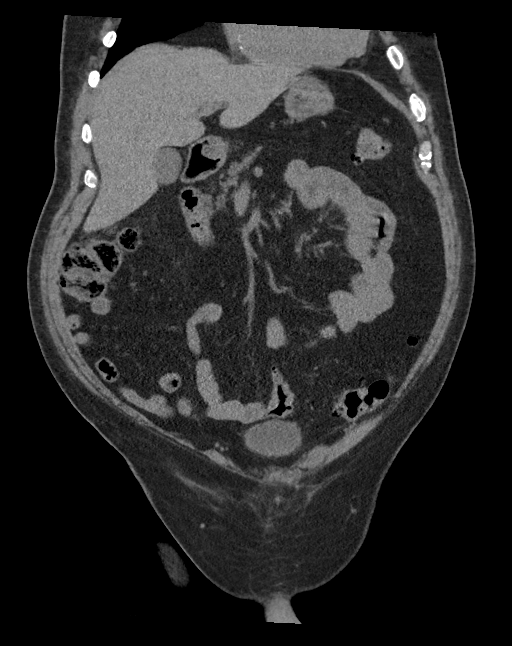
[im 49/110  soft-tissue]
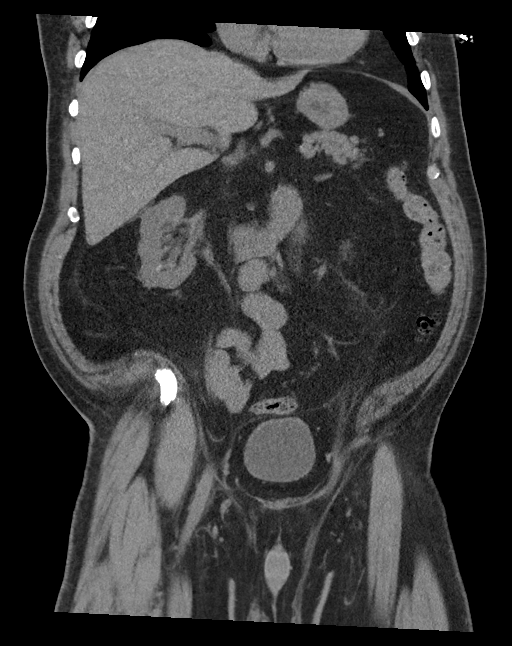
[im 61/110  soft-tissue]
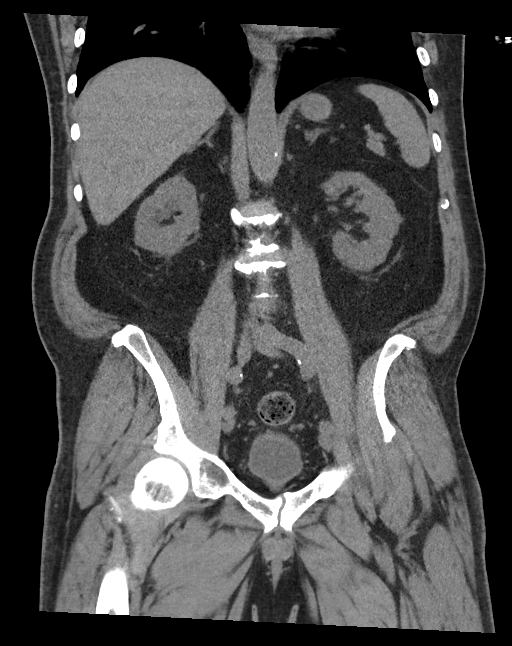

[16 of 46 positions shown; findings below may reference images not displayed]

FINDINGS: Lower chest: Ground-glass opacity in the posterior left lower lobe
is incompletely visualized on this study. It measures at least 2.8 x
2.0 cm and has increased in size since previous study.

Hepatobiliary: No mass visualized on this unenhanced exam.
Gallbladder is unremarkable. No evidence of biliary ductal
dilatation.

Pancreas: No mass or inflammatory process visualized on this
unenhanced exam.

Spleen:  Within normal limits in size.

Adrenals/Urinary tract: Small renal calculi are again seen
bilaterally, largest in the lower pole of the left kidney measuring
11 mm. No evidence of ureteral calculi or hydronephrosis.
Unremarkable unopacified urinary bladder.

Stomach/Bowel: No evidence of obstruction, inflammatory process, or
abnormal fluid collections. Normal appendix visualized. Diffuse
colonic diverticulosis is again seen, without evidence of
diverticulitis.

Vascular/Lymphatic: No pathologically enlarged lymph nodes
identified. No evidence of abdominal aortic aneurysm. Aortic
atherosclerotic calcification noted.

Reproductive:  No mass or other significant abnormality.

Other:  None.

Musculoskeletal:  No suspicious bone lesions identified.
IMPRESSION: Bilateral nephrolithiasis. No evidence of ureteral calculi or
hydronephrosis.

Colonic diverticulosis. No radiographic evidence of diverticulitis.

Incompletely visualized ground-glass opacity in posterior left lower
lobe, with increased size since previous study. Differential
diagnosis includes inflammatory or infectious etiologies, and
low-grade adenocarcinoma. Recommend short-term follow-up by chest CT
without contrast in 3 months.

Aortic Atherosclerosis ([id number]).

## 2023-01-07 ENCOUNTER — Ambulatory Visit (INDEPENDENT_AMBULATORY_CARE_PROVIDER_SITE_OTHER): Payer: Medicare HMO | Admitting: Urology

## 2023-01-07 ENCOUNTER — Ambulatory Visit (HOSPITAL_COMMUNITY)
Admission: RE | Admit: 2023-01-07 | Discharge: 2023-01-07 | Disposition: A | Discharge status: Home / Self Care | Payer: Medicare HMO | Source: Ambulatory Visit | Attending: Urology | Admitting: Urology

## 2023-01-07 VITALS — BP 111/62 | HR 64

## 2023-01-07 DIAGNOSIS — N200 Calculus of kidney: Secondary | ICD-10-CM

## 2023-01-07 DIAGNOSIS — R140 Abdominal distension (gaseous): Secondary | ICD-10-CM | POA: Diagnosis not present

## 2023-01-07 DIAGNOSIS — N400 Enlarged prostate without lower urinary tract symptoms: Secondary | ICD-10-CM | POA: Diagnosis not present

## 2023-01-07 DIAGNOSIS — R351 Nocturia: Secondary | ICD-10-CM | POA: Diagnosis not present

## 2023-01-07 DIAGNOSIS — N5201 Erectile dysfunction due to arterial insufficiency: Secondary | ICD-10-CM

## 2023-01-07 LAB — URINALYSIS, ROUTINE W REFLEX MICROSCOPIC
Bilirubin, UA: NEGATIVE
Glucose, UA: NEGATIVE
Ketones, UA: NEGATIVE
Leukocytes,UA: NEGATIVE
Nitrite, UA: NEGATIVE
Protein,UA: NEGATIVE
RBC, UA: NEGATIVE
Specific Gravity, UA: 1.015 (ref 1.005–1.030)
Urobilinogen, Ur: 0.2 mg/dL (ref 0.2–1.0)
pH, UA: 5.5 (ref 5.0–7.5)

## 2023-01-07 MED ORDER — TADALAFIL 5 MG PO TABS: 5.0000 mg | ORAL_TABLET | Freq: Every day | ORAL | 11 refills | Status: DC

## 2023-01-07 NOTE — Progress Notes (Signed)
01/07/2023 10:37 AM   Derek [name] Feb 10, 1946 [id number]  Referring provider: Donetta Potts, MD [address] [place],  [place] [address]  Followup nephrolithiasis and BPH   HPI: [name] is a 77yo here for followup for BPH and nephrolithiasis. KUB shows 2mm left renal calculus. No stone events since last visit. No flank pain. IPSS 12 QOL 1 off of flomax. Nocturia 0-1x. Urine stream strong. NO straining to urinate. NO urinary hesitancy.  He uses sildenafil 100mg  prn for mixed results   PMH: Past Medical History:  Diagnosis Date   Arthritis    fingers   CAD (coronary artery disease)    CABG 2010 - LIMA to LAD, SVG to diagonal, SVG to PDA and PLV   Essential hypertension    GERD (gastroesophageal reflux disease)    History of iron deficiency anemia    History of kidney stones    History of pneumonia    Hyperlipidemia    NSTEMI (non-ST elevated myocardial infarction) (HCC) 2010   Type 2 diabetes mellitus (HCC)     Surgical History: Past Surgical History:  Procedure Laterality Date   CORONARY ARTERY BYPASS GRAFT  2010   QUADRUPLE BYPASS AFTER HEART ATTACK    CYSTOSCOPY W/ URETERAL STENT PLACEMENT Right 05/16/2019   Procedure: CYSTOSCOPY WITH RETROGRADE PYELOGRAM/URETERAL STENT PLACEMENT;  Surgeon: Alfredo Martinez, MD;  Location: [facility];  Service: Urology;  Laterality: Right;   CYSTOSCOPY W/ URETERAL STENT REMOVAL Right 06/04/2019   Procedure: CYSTOSCOPY WITH STENT REMOVAL;  Surgeon: Marcine Matar, MD;  Location: [facility];  Service: Urology;  Laterality: Right;   CYSTOSCOPY WITH HOLMIUM LASER LITHOTRIPSY Right 06/04/2019   Procedure: CYSTOSCOPY, URETEROSCOPY, WITH HOLMIUM LASER, STENT PLACEMENT;  Surgeon: Marcine Matar, MD;  Location: [facility];  Service: Urology;  Laterality: Right;   CYSTOSCOPY/URETEROSCOPY/HOLMIUM LASER/STENT PLACEMENT Left 06/04/2019   Procedure: CYSTOSCOPY, URETEROSCOPY, RETROGRADE PYELOGRAM, STENT PLACEMENT;  Surgeon: Marcine Matar,  MD;  Location: [facility];  Service: Urology;  Laterality: Left;   PILONIDAL CYST EXCISION  1960    Home Medications:  Allergies as of 01/07/2023   No Known Allergies      Medication List        Accurate as of January 07, 2023 10:37 AM. If you have any questions, ask your nurse or doctor.          amLODipine 2.5 MG tablet Commonly known as: NORVASC Take 2.5 mg by mouth daily.   aspirin EC 81 MG tablet Take 81 mg by mouth daily.   [place] Dream Arthritis 0.025 % Crea Generic drug: Histamine Dihydrochloride Apply 1 application topically daily as needed (pain).   glipiZIDE 5 MG tablet Commonly known as: GLUCOTROL Take 2.5 mg by mouth every morning.   Lidocaine HCl 4 % Crea Apply 1 application topically daily as needed (pain).   metoprolol tartrate 50 MG tablet Commonly known as: LOPRESSOR Take 25 mg by mouth 2 (two) times daily.   nitroGLYCERIN 0.4 MG SL tablet Commonly known as: NITROSTAT Place 1 tablet (0.4 mg total) under the tongue every 5 (five) minutes as needed for chest pain.   ondansetron 4 MG tablet Commonly known as: Zofran Take 1 tablet (4 mg total) by mouth every 8 (eight) hours as needed for nausea or vomiting.   pravastatin 40 MG tablet Commonly known as: PRAVACHOL Take 40 mg by mouth daily.   sildenafil 100 MG tablet Commonly known as: VIAGRA Take 1 tablet (100 mg total) by mouth as needed for erectile dysfunction.  tamsulosin 0.4 MG Caps capsule Commonly known as: FLOMAX Take 1 capsule (0.4 mg total) by mouth daily after supper.   traMADol 50 MG tablet Commonly known as: ULTRAM Take 50 mg by mouth 2 (two) times daily.        Allergies: No Known Allergies  Family History: Family History  Problem Relation Age of Onset   Thyroid disease Mother    Heart attack Father     Social History:  reports that he quit smoking about 14 years ago. His smoking use included cigarettes. He has a 36.00 pack-year smoking history. He has never used  smokeless tobacco. He reports current alcohol use. He reports that he does not use drugs.  ROS: All other review of systems were reviewed and are negative except what is noted above in HPI  Physical Exam: BP 111/62   Pulse 64   Constitutional:  Alert and oriented, No acute distress. HEENT: [place] AT, moist mucus membranes.  Trachea midline, no masses. Cardiovascular: No clubbing, cyanosis, or edema. Respiratory: Normal respiratory effort, no increased work of breathing. GI: Abdomen is soft, nontender, nondistended, no abdominal masses GU: No CVA tenderness.  Lymph: No cervical or inguinal lymphadenopathy. Skin: No rashes, bruises or suspicious lesions. Neurologic: Grossly intact, no focal deficits, moving all 4 extremities. Psychiatric: Normal mood and affect.  Laboratory Data: Lab Results  Component Value Date   WBC 8.9 08/11/2020   HGB 11.5 (L) 08/11/2020   HCT 36.4 (L) 08/11/2020   MCV 97.8 08/11/2020   PLT 336 08/11/2020    Lab Results  Component Value Date   CREATININE 2.87 (H) 08/11/2020    No results found for: "PSA"  No results found for: "TESTOSTERONE"  Lab Results  Component Value Date   HGBA1C 6.6 (H) 05/16/2019    Urinalysis    Component Value Date/Time   COLORURINE YELLOW 05/17/2019 1041   APPEARANCEUR Clear 01/06/2022 1001   LABSPEC 1.014 05/17/2019 1041   PHURINE 6.0 05/17/2019 1041   GLUCOSEU Negative 01/06/2022 1001   HGBUR LARGE (A) 05/17/2019 1041   BILIRUBINUR Negative 01/06/2022 1001   KETONESUR NEGATIVE 05/17/2019 1041   PROTEINUR Negative 01/06/2022 1001   PROTEINUR 30 (A) 05/17/2019 1041   UROBILINOGEN 0.2 11/07/2019 0954   NITRITE Negative 01/06/2022 1001   NITRITE NEGATIVE 05/17/2019 1041   LEUKOCYTESUR Negative 01/06/2022 1001   LEUKOCYTESUR MODERATE (A) 05/17/2019 1041    Lab Results  Component Value Date   LABMICR Comment 01/06/2022   WBCUA 0-5 09/08/2020   LABEPIT None seen 09/08/2020   MUCUS Present 09/08/2020   BACTERIA  None seen 09/08/2020    Pertinent Imaging:  Results for orders placed during the hospital encounter of 01/06/22  Abdomen 1 view (KUB)  Narrative CLINICAL DATA:  Follow-up nephrolithiasis.  EXAM: ABDOMEN - 1 VIEW  COMPARISON:  January 06, 2021  FINDINGS: The bowel gas pattern is normal. No radio-opaque calculi or other significant radiographic abnormality are seen. The left lower pole renal calculi seen on the prior study are not clearly identified on the current exam.  IMPRESSION: Negative.   Electronically Signed By: Aram Candela M.D. On: 01/07/2022 21:39  No results found for this or any previous visit.  No results found for this or any previous visit.  No results found for this or any previous visit.  Results for orders placed during the hospital encounter of 09/03/20  US RENAL  Narrative CLINICAL DATA:  Acute kidney failure  EXAM: RENAL / URINARY TRACT ULTRASOUND COMPLETE  COMPARISON:  08/01/2020,  02/04/2020, CT 11/02/2019  FINDINGS: Right Kidney:  Renal measurements: 14.2 x 6.2 x 6.8 cm = volume: 335.2 mL. Echogenicity within normal limits. There is moderate right hydronephrosis which is similar to 08/01/2020, but increased compared with 02/04/2020. 1.6 cm shadowing echogenic focus lower pole right kidney likely representing a kidney stone.  Left Kidney:  Renal measurements: 13.8 x 6.7 x 5.8 cm = volume: 276.3 mL. Cortical echogenicity is normal. No hydronephrosis. 1.8 cm shadowing echogenic focus lower pole left kidney, likely representing a stone.  Bladder:  Appears normal for degree of bladder distention.  Other:  None.  IMPRESSION: 1. Bilateral intrarenal calculi. Moderate right hydronephrosis, suggest correlation with CT KUB to assess for distal obstruction. 2. Negative for left hydronephrosis   Electronically Signed By: Jasmine Pang M.D. On: 09/03/2020 15:20  No valid procedures specified. No results found for this or  any previous visit.  Results for orders placed in visit on 10/23/20  CT RENAL STONE STUDY  Narrative CLINICAL DATA:  Flank pain.  Nephrolithiasis.  EXAM: CT ABDOMEN AND PELVIS WITHOUT CONTRAST  TECHNIQUE: Multidetector CT imaging of the abdomen and pelvis was performed following the standard protocol without IV contrast.  COMPARISON:  11/02/2019  FINDINGS: Lower chest: Ground-glass opacity in the posterior left lower lobe is incompletely visualized on this study. It measures at least 2.8 x 2.0 cm and has increased in size since previous study.  Hepatobiliary: No mass visualized on this unenhanced exam. Gallbladder is unremarkable. No evidence of biliary ductal dilatation.  Pancreas: No mass or inflammatory process visualized on this unenhanced exam.  Spleen:  Within normal limits in size.  Adrenals/Urinary tract: Small renal calculi are again seen bilaterally, largest in the lower pole of the left kidney measuring 11 mm. No evidence of ureteral calculi or hydronephrosis. Unremarkable unopacified urinary bladder.  Stomach/Bowel: No evidence of obstruction, inflammatory process, or abnormal fluid collections. Normal appendix visualized. Diffuse colonic diverticulosis is again seen, without evidence of diverticulitis.  Vascular/Lymphatic: No pathologically enlarged lymph nodes identified. No evidence of abdominal aortic aneurysm. Aortic atherosclerotic calcification noted.  Reproductive:  No mass or other significant abnormality.  Other:  None.  Musculoskeletal:  No suspicious bone lesions identified.  IMPRESSION: Bilateral nephrolithiasis. No evidence of ureteral calculi or hydronephrosis.  Colonic diverticulosis. No radiographic evidence of diverticulitis.  Incompletely visualized ground-glass opacity in posterior left lower lobe, with increased size since previous study. Differential diagnosis includes inflammatory or infectious etiologies, and low-grade  adenocarcinoma. Recommend short-term follow-up by chest CT without contrast in 3 months.  Aortic Atherosclerosis (ICD10-I70.0).   Electronically Signed By: Danae Orleans M.D. On: 10/23/2020 15:59   Assessment & Plan:    1. Nephrolithiasis -followup 1 year with KUB - Urinalysis, Routine w reflex microscopic  2. Erectile dysfunction due to arterial insufficiency Tadalafil  daily  3. Benign prostatic hyperplasia without lower urinary tract symptoms -flomax 0.4mg  daily  4. Nocturia Flomax 0.4mg  daily   No follow-ups on file.  Wilkie Aye, MD  [facility] [place]

## 2023-01-11 ENCOUNTER — Encounter: Payer: Self-pay | Admitting: Urology

## 2023-01-11 NOTE — Patient Instructions (Signed)

## 2023-01-18 DIAGNOSIS — E785 Hyperlipidemia, unspecified: Secondary | ICD-10-CM | POA: Diagnosis not present

## 2023-01-18 DIAGNOSIS — I10 Essential (primary) hypertension: Secondary | ICD-10-CM | POA: Diagnosis not present

## 2023-01-18 DIAGNOSIS — I129 Hypertensive chronic kidney disease with stage 1 through stage 4 chronic kidney disease, or unspecified chronic kidney disease: Secondary | ICD-10-CM | POA: Diagnosis not present

## 2023-01-26 DIAGNOSIS — R030 Elevated blood-pressure reading, without diagnosis of hypertension: Secondary | ICD-10-CM | POA: Diagnosis not present

## 2023-01-26 DIAGNOSIS — S46819A Strain of other muscles, fascia and tendons at shoulder and upper arm level, unspecified arm, initial encounter: Secondary | ICD-10-CM | POA: Diagnosis not present

## 2023-01-26 DIAGNOSIS — Z6837 Body mass index (BMI) 37.0-37.9, adult: Secondary | ICD-10-CM | POA: Diagnosis not present

## 2023-02-18 DIAGNOSIS — M6281 Muscle weakness (generalized): Secondary | ICD-10-CM | POA: Diagnosis not present

## 2023-02-18 DIAGNOSIS — M542 Cervicalgia: Secondary | ICD-10-CM | POA: Diagnosis not present

## 2023-02-22 DIAGNOSIS — M542 Cervicalgia: Secondary | ICD-10-CM | POA: Diagnosis not present

## 2023-02-22 DIAGNOSIS — M6281 Muscle weakness (generalized): Secondary | ICD-10-CM | POA: Diagnosis not present

## 2023-02-24 DIAGNOSIS — M542 Cervicalgia: Secondary | ICD-10-CM | POA: Diagnosis not present

## 2023-02-24 DIAGNOSIS — M6281 Muscle weakness (generalized): Secondary | ICD-10-CM | POA: Diagnosis not present

## 2023-03-02 DIAGNOSIS — Z6837 Body mass index (BMI) 37.0-37.9, adult: Secondary | ICD-10-CM | POA: Diagnosis not present

## 2023-03-02 DIAGNOSIS — R030 Elevated blood-pressure reading, without diagnosis of hypertension: Secondary | ICD-10-CM | POA: Diagnosis not present

## 2023-03-02 DIAGNOSIS — S46819A Strain of other muscles, fascia and tendons at shoulder and upper arm level, unspecified arm, initial encounter: Secondary | ICD-10-CM | POA: Diagnosis not present

## 2023-03-02 DIAGNOSIS — M542 Cervicalgia: Secondary | ICD-10-CM | POA: Diagnosis not present

## 2023-03-14 DIAGNOSIS — M47812 Spondylosis without myelopathy or radiculopathy, cervical region: Secondary | ICD-10-CM | POA: Diagnosis not present

## 2023-03-14 DIAGNOSIS — M19011 Primary osteoarthritis, right shoulder: Secondary | ICD-10-CM | POA: Diagnosis not present

## 2023-03-14 DIAGNOSIS — M25511 Pain in right shoulder: Secondary | ICD-10-CM | POA: Diagnosis not present

## 2023-03-30 DIAGNOSIS — M542 Cervicalgia: Secondary | ICD-10-CM | POA: Diagnosis not present

## 2023-03-30 DIAGNOSIS — N183 Chronic kidney disease, stage 3 unspecified: Secondary | ICD-10-CM | POA: Diagnosis not present

## 2023-03-30 DIAGNOSIS — R911 Solitary pulmonary nodule: Secondary | ICD-10-CM | POA: Diagnosis not present

## 2023-03-30 DIAGNOSIS — S46819A Strain of other muscles, fascia and tendons at shoulder and upper arm level, unspecified arm, initial encounter: Secondary | ICD-10-CM | POA: Diagnosis not present

## 2023-03-30 DIAGNOSIS — I10 Essential (primary) hypertension: Secondary | ICD-10-CM | POA: Diagnosis not present

## 2023-03-30 DIAGNOSIS — E1165 Type 2 diabetes mellitus with hyperglycemia: Secondary | ICD-10-CM | POA: Diagnosis not present

## 2023-03-30 DIAGNOSIS — N200 Calculus of kidney: Secondary | ICD-10-CM | POA: Diagnosis not present

## 2023-03-30 DIAGNOSIS — D472 Monoclonal gammopathy: Secondary | ICD-10-CM | POA: Diagnosis not present

## 2023-03-30 DIAGNOSIS — M25539 Pain in unspecified wrist: Secondary | ICD-10-CM | POA: Diagnosis not present

## 2023-03-30 DIAGNOSIS — Z23 Encounter for immunization: Secondary | ICD-10-CM | POA: Diagnosis not present

## 2023-03-30 DIAGNOSIS — E875 Hyperkalemia: Secondary | ICD-10-CM | POA: Diagnosis not present

## 2023-04-07 ENCOUNTER — Telehealth: Payer: Self-pay

## 2023-04-07 DIAGNOSIS — N5201 Erectile dysfunction due to arterial insufficiency: Secondary | ICD-10-CM

## 2023-04-07 NOTE — Telephone Encounter (Signed)
Patient called to ask if Dr. Ronne Binning can increase his daily Tadalafil dosage. Patient reports 5mg  daily has helped but not as much as he would like.  Message sent to MD.

## 2023-04-11 DIAGNOSIS — E1122 Type 2 diabetes mellitus with diabetic chronic kidney disease: Secondary | ICD-10-CM | POA: Diagnosis not present

## 2023-04-11 DIAGNOSIS — R809 Proteinuria, unspecified: Secondary | ICD-10-CM | POA: Diagnosis not present

## 2023-04-11 DIAGNOSIS — D472 Monoclonal gammopathy: Secondary | ICD-10-CM | POA: Diagnosis not present

## 2023-04-11 DIAGNOSIS — N200 Calculus of kidney: Secondary | ICD-10-CM | POA: Diagnosis not present

## 2023-04-15 NOTE — Telephone Encounter (Signed)
Can you please send in for patient and notify.

## 2023-04-18 MED ORDER — TADALAFIL 20 MG PO TABS: 10.0000 mg | ORAL_TABLET | Freq: Every day | ORAL | 5 refills | Status: DC | PRN

## 2023-04-18 NOTE — Telephone Encounter (Signed)
Patient called with no answer. Message let to return call to office

## 2023-04-21 NOTE — Telephone Encounter (Signed)
Patient left a voice message 04-21-23.  Needing a call back on cell phone.

## 2023-04-21 NOTE — Telephone Encounter (Signed)
Patient called and made aware.

## 2023-06-01 DIAGNOSIS — E1122 Type 2 diabetes mellitus with diabetic chronic kidney disease: Secondary | ICD-10-CM | POA: Diagnosis not present

## 2023-06-01 DIAGNOSIS — R7989 Other specified abnormal findings of blood chemistry: Secondary | ICD-10-CM | POA: Diagnosis not present

## 2023-06-08 DIAGNOSIS — I25810 Atherosclerosis of coronary artery bypass graft(s) without angina pectoris: Secondary | ICD-10-CM | POA: Diagnosis not present

## 2023-06-08 DIAGNOSIS — I10 Essential (primary) hypertension: Secondary | ICD-10-CM | POA: Diagnosis not present

## 2023-06-08 DIAGNOSIS — N1832 Chronic kidney disease, stage 3b: Secondary | ICD-10-CM | POA: Diagnosis not present

## 2023-06-08 DIAGNOSIS — E782 Mixed hyperlipidemia: Secondary | ICD-10-CM | POA: Diagnosis not present

## 2023-06-08 DIAGNOSIS — Z8639 Personal history of other endocrine, nutritional and metabolic disease: Secondary | ICD-10-CM | POA: Diagnosis not present

## 2023-06-10 DIAGNOSIS — B3789 Other sites of candidiasis: Secondary | ICD-10-CM | POA: Diagnosis not present

## 2023-06-10 DIAGNOSIS — M25539 Pain in unspecified wrist: Secondary | ICD-10-CM | POA: Diagnosis not present

## 2023-06-10 DIAGNOSIS — N200 Calculus of kidney: Secondary | ICD-10-CM | POA: Diagnosis not present

## 2023-06-10 DIAGNOSIS — M542 Cervicalgia: Secondary | ICD-10-CM | POA: Diagnosis not present

## 2023-06-10 DIAGNOSIS — R911 Solitary pulmonary nodule: Secondary | ICD-10-CM | POA: Diagnosis not present

## 2023-06-10 DIAGNOSIS — E875 Hyperkalemia: Secondary | ICD-10-CM | POA: Diagnosis not present

## 2023-06-10 DIAGNOSIS — D472 Monoclonal gammopathy: Secondary | ICD-10-CM | POA: Diagnosis not present

## 2023-06-10 DIAGNOSIS — E1165 Type 2 diabetes mellitus with hyperglycemia: Secondary | ICD-10-CM | POA: Diagnosis not present

## 2023-06-10 DIAGNOSIS — I10 Essential (primary) hypertension: Secondary | ICD-10-CM | POA: Diagnosis not present

## 2023-06-10 DIAGNOSIS — S46819A Strain of other muscles, fascia and tendons at shoulder and upper arm level, unspecified arm, initial encounter: Secondary | ICD-10-CM | POA: Diagnosis not present

## 2023-06-10 DIAGNOSIS — N183 Chronic kidney disease, stage 3 unspecified: Secondary | ICD-10-CM | POA: Diagnosis not present

## 2023-06-20 DIAGNOSIS — I10 Essential (primary) hypertension: Secondary | ICD-10-CM | POA: Diagnosis not present

## 2023-06-20 DIAGNOSIS — E875 Hyperkalemia: Secondary | ICD-10-CM | POA: Diagnosis not present

## 2023-07-12 DIAGNOSIS — Z6838 Body mass index (BMI) 38.0-38.9, adult: Secondary | ICD-10-CM | POA: Diagnosis not present

## 2023-07-12 DIAGNOSIS — B372 Candidiasis of skin and nail: Secondary | ICD-10-CM | POA: Diagnosis not present

## 2023-08-15 DIAGNOSIS — N1831 Chronic kidney disease, stage 3a: Secondary | ICD-10-CM | POA: Diagnosis not present

## 2023-08-15 DIAGNOSIS — E875 Hyperkalemia: Secondary | ICD-10-CM | POA: Diagnosis not present

## 2023-08-15 DIAGNOSIS — I129 Hypertensive chronic kidney disease with stage 1 through stage 4 chronic kidney disease, or unspecified chronic kidney disease: Secondary | ICD-10-CM | POA: Diagnosis not present

## 2023-08-15 DIAGNOSIS — D472 Monoclonal gammopathy: Secondary | ICD-10-CM | POA: Diagnosis not present

## 2023-08-15 DIAGNOSIS — N189 Chronic kidney disease, unspecified: Secondary | ICD-10-CM | POA: Diagnosis not present

## 2023-08-15 DIAGNOSIS — E1122 Type 2 diabetes mellitus with diabetic chronic kidney disease: Secondary | ICD-10-CM | POA: Diagnosis not present

## 2023-08-23 DIAGNOSIS — E875 Hyperkalemia: Secondary | ICD-10-CM | POA: Diagnosis not present

## 2023-08-23 DIAGNOSIS — R519 Headache, unspecified: Secondary | ICD-10-CM | POA: Diagnosis not present

## 2023-08-23 DIAGNOSIS — D472 Monoclonal gammopathy: Secondary | ICD-10-CM | POA: Diagnosis not present

## 2023-08-23 DIAGNOSIS — R5383 Other fatigue: Secondary | ICD-10-CM | POA: Diagnosis not present

## 2023-08-29 DIAGNOSIS — R809 Proteinuria, unspecified: Secondary | ICD-10-CM | POA: Diagnosis not present

## 2023-08-29 DIAGNOSIS — D472 Monoclonal gammopathy: Secondary | ICD-10-CM | POA: Diagnosis not present

## 2023-08-29 DIAGNOSIS — E1122 Type 2 diabetes mellitus with diabetic chronic kidney disease: Secondary | ICD-10-CM | POA: Diagnosis not present

## 2023-08-29 DIAGNOSIS — N200 Calculus of kidney: Secondary | ICD-10-CM | POA: Diagnosis not present

## 2023-11-03 ENCOUNTER — Encounter: Payer: Self-pay | Admitting: Urology

## 2023-12-08 DIAGNOSIS — I25810 Atherosclerosis of coronary artery bypass graft(s) without angina pectoris: Secondary | ICD-10-CM | POA: Diagnosis not present

## 2023-12-08 DIAGNOSIS — E119 Type 2 diabetes mellitus without complications: Secondary | ICD-10-CM | POA: Diagnosis not present

## 2023-12-08 DIAGNOSIS — E782 Mixed hyperlipidemia: Secondary | ICD-10-CM | POA: Diagnosis not present

## 2023-12-08 DIAGNOSIS — N1832 Chronic kidney disease, stage 3b: Secondary | ICD-10-CM | POA: Diagnosis not present

## 2023-12-08 DIAGNOSIS — I10 Essential (primary) hypertension: Secondary | ICD-10-CM | POA: Diagnosis not present

## 2023-12-08 DIAGNOSIS — Z8639 Personal history of other endocrine, nutritional and metabolic disease: Secondary | ICD-10-CM | POA: Diagnosis not present

## 2023-12-09 DIAGNOSIS — N1831 Chronic kidney disease, stage 3a: Secondary | ICD-10-CM | POA: Diagnosis not present

## 2023-12-09 DIAGNOSIS — Z13220 Encounter for screening for lipoid disorders: Secondary | ICD-10-CM | POA: Diagnosis not present

## 2023-12-09 DIAGNOSIS — Z1321 Encounter for screening for nutritional disorder: Secondary | ICD-10-CM | POA: Diagnosis not present

## 2023-12-09 DIAGNOSIS — Z1329 Encounter for screening for other suspected endocrine disorder: Secondary | ICD-10-CM | POA: Diagnosis not present

## 2023-12-09 DIAGNOSIS — E1122 Type 2 diabetes mellitus with diabetic chronic kidney disease: Secondary | ICD-10-CM | POA: Diagnosis not present

## 2023-12-09 DIAGNOSIS — R945 Abnormal results of liver function studies: Secondary | ICD-10-CM | POA: Diagnosis not present

## 2023-12-09 DIAGNOSIS — I10 Essential (primary) hypertension: Secondary | ICD-10-CM | POA: Diagnosis not present

## 2023-12-15 DIAGNOSIS — Z6838 Body mass index (BMI) 38.0-38.9, adult: Secondary | ICD-10-CM | POA: Diagnosis not present

## 2023-12-15 DIAGNOSIS — E559 Vitamin D deficiency, unspecified: Secondary | ICD-10-CM | POA: Diagnosis not present

## 2023-12-15 DIAGNOSIS — E1129 Type 2 diabetes mellitus with other diabetic kidney complication: Secondary | ICD-10-CM | POA: Diagnosis not present

## 2023-12-15 DIAGNOSIS — Z0000 Encounter for general adult medical examination without abnormal findings: Secondary | ICD-10-CM | POA: Diagnosis not present

## 2023-12-15 DIAGNOSIS — I739 Peripheral vascular disease, unspecified: Secondary | ICD-10-CM | POA: Diagnosis not present

## 2023-12-15 DIAGNOSIS — Z0001 Encounter for general adult medical examination with abnormal findings: Secondary | ICD-10-CM | POA: Diagnosis not present

## 2023-12-15 DIAGNOSIS — E785 Hyperlipidemia, unspecified: Secondary | ICD-10-CM | POA: Diagnosis not present

## 2023-12-15 DIAGNOSIS — Z1389 Encounter for screening for other disorder: Secondary | ICD-10-CM | POA: Diagnosis not present

## 2023-12-15 DIAGNOSIS — Z1331 Encounter for screening for depression: Secondary | ICD-10-CM | POA: Diagnosis not present

## 2023-12-19 DIAGNOSIS — E1122 Type 2 diabetes mellitus with diabetic chronic kidney disease: Secondary | ICD-10-CM | POA: Diagnosis not present

## 2023-12-19 DIAGNOSIS — I10 Essential (primary) hypertension: Secondary | ICD-10-CM | POA: Diagnosis not present

## 2023-12-19 DIAGNOSIS — E875 Hyperkalemia: Secondary | ICD-10-CM | POA: Diagnosis not present

## 2023-12-26 ENCOUNTER — Ambulatory Visit: Payer: Medicare HMO | Admitting: Urology

## 2024-01-02 ENCOUNTER — Ambulatory Visit: Payer: Medicare HMO | Admitting: Urology

## 2024-01-11 ENCOUNTER — Ambulatory Visit (HOSPITAL_COMMUNITY)
Admission: RE | Admit: 2024-01-11 | Discharge: 2024-01-11 | Disposition: A | Discharge status: Home / Self Care | Source: Ambulatory Visit | Attending: Urology | Admitting: Urology

## 2024-01-11 ENCOUNTER — Ambulatory Visit: Payer: Medicare HMO | Admitting: Urology

## 2024-01-11 VITALS — BP 152/63 | HR 78

## 2024-01-11 DIAGNOSIS — N200 Calculus of kidney: Secondary | ICD-10-CM

## 2024-01-11 DIAGNOSIS — R351 Nocturia: Secondary | ICD-10-CM

## 2024-01-11 DIAGNOSIS — N5201 Erectile dysfunction due to arterial insufficiency: Secondary | ICD-10-CM | POA: Diagnosis not present

## 2024-01-11 DIAGNOSIS — N400 Enlarged prostate without lower urinary tract symptoms: Secondary | ICD-10-CM | POA: Diagnosis not present

## 2024-01-11 MED ORDER — TADALAFIL 5 MG PO TABS: 5.0000 mg | ORAL_TABLET | Freq: Every day | ORAL | 11 refills | Status: AC

## 2024-01-11 MED ORDER — TADALAFIL 20 MG PO TABS: 10.0000 mg | ORAL_TABLET | ORAL | 5 refills | Status: AC | PRN

## 2024-01-11 NOTE — Progress Notes (Signed)
 01/11/2024 2:45 PM   BENAJAMIN COPLAND 03-25-1946 [id number]  Referring provider: Lauran Pollard, MD [address] [place],  [place] [address]  Followup nephrolithiasis   HPI: Mr Mousley is a 78yo here for followup for BPH and nephrolithiasis. No stone events since last visit. KUB shows no definitive calculi. He stopped flomax  due to hypotension. Uirne stream strong. NO straining to urinate. No other complaints today   PMH: Past Medical History:  Diagnosis Date   Arthritis    fingers   CAD (coronary artery disease)    CABG 2010 - LIMA to LAD, SVG to diagonal, SVG to PDA and PLV   Essential hypertension    GERD (gastroesophageal reflux disease)    History of iron deficiency anemia    History of kidney stones    History of pneumonia    Hyperlipidemia    NSTEMI (non-ST elevated myocardial infarction) (HCC) 2010   Type 2 diabetes mellitus (HCC)     Surgical History: Past Surgical History:  Procedure Laterality Date   CORONARY ARTERY BYPASS GRAFT  2010   QUADRUPLE BYPASS AFTER HEART ATTACK    CYSTOSCOPY W/ URETERAL STENT PLACEMENT Right 05/16/2019   Procedure: CYSTOSCOPY WITH RETROGRADE PYELOGRAM/URETERAL STENT PLACEMENT;  Surgeon: Erman Hayward, MD;  Location: [facility];  Service: Urology;  Laterality: Right;   CYSTOSCOPY W/ URETERAL STENT REMOVAL Right 06/04/2019   Procedure: CYSTOSCOPY WITH STENT REMOVAL;  Surgeon: Trent Frizzle, MD;  Location: [facility];  Service: Urology;  Laterality: Right;   CYSTOSCOPY WITH HOLMIUM LASER LITHOTRIPSY Right 06/04/2019   Procedure: CYSTOSCOPY, URETEROSCOPY, WITH HOLMIUM LASER, STENT PLACEMENT;  Surgeon: Trent Frizzle, MD;  Location: [facility];  Service: Urology;  Laterality: Right;   CYSTOSCOPY/URETEROSCOPY/HOLMIUM LASER/STENT PLACEMENT Left 06/04/2019   Procedure: CYSTOSCOPY, URETEROSCOPY, RETROGRADE PYELOGRAM, STENT PLACEMENT;  Surgeon: Trent Frizzle, MD;  Location: [facility];  Service: Urology;  Laterality: Left;   PILONIDAL  CYST EXCISION  1960    Home Medications:  Allergies as of 01/11/2024   No Known Allergies      Medication List        Accurate as of January 11, 2024  2:45 PM. If you have any questions, ask your nurse or doctor.          amLODipine  2.5 MG tablet Commonly known as: NORVASC  Take 2.5 mg by mouth daily.   aspirin EC 81 MG tablet Take 81 mg by mouth daily.   [place] Dream Arthritis 0.025 % Crea Generic drug: Histamine Dihydrochloride Apply 1 application topically daily as needed (pain).   glipiZIDE 5 MG tablet Commonly known as: GLUCOTROL Take 2.5 mg by mouth every morning.   Lidocaine  HCl 4 % Crea Apply 1 application topically daily as needed (pain).   metoprolol  tartrate 50 MG tablet Commonly known as: LOPRESSOR  Take 25 mg by mouth 2 (two) times daily.   nitroGLYCERIN  0.4 MG SL tablet Commonly known as: NITROSTAT  Place 1 tablet (0.4 mg total) under the tongue every 5 (five) minutes as needed for chest pain.   ondansetron  4 MG tablet Commonly known as: Zofran  Take 1 tablet (4 mg total) by mouth every 8 (eight) hours as needed for nausea or vomiting.   pravastatin  40 MG tablet Commonly known as: PRAVACHOL  Take 40 mg by mouth daily.   sildenafil  100 MG tablet Commonly known as: VIAGRA  Take 1 tablet (100 mg total) by mouth as needed for erectile dysfunction.   tadalafil  5 MG tablet Commonly known as: CIALIS  Take 1 tablet (5 mg total) by mouth  daily.   tadalafil  20 MG tablet Commonly known as: CIALIS  Take 0.5 tablets (10 mg total) by mouth daily as needed for erectile dysfunction (take 10-20 mg by mouth prn).   tamsulosin  0.4 MG Caps capsule Commonly known as: FLOMAX  Take 1 capsule (0.4 mg total) by mouth daily after supper.   traMADol  50 MG tablet Commonly known as: ULTRAM  Take 50 mg by mouth 2 (two) times daily.        Allergies: No Known Allergies  Family History: Family History  Problem Relation Age of Onset   Thyroid  disease Mother     Heart attack Father     Social History:  reports that he quit smoking about 15 years ago. His smoking use included cigarettes. He started smoking about 27 years ago. He has a 36 pack-year smoking history. He has never used smokeless tobacco. He reports current alcohol  use. He reports that he does not use drugs.  ROS: All other review of systems were reviewed and are negative except what is noted above in HPI  Physical Exam: BP (!) 152/63   Pulse 78   Constitutional:  Alert and oriented, No acute distress. HEENT: [place] AT, moist mucus membranes.  Trachea midline, no masses. Cardiovascular: No clubbing, cyanosis, or edema. Respiratory: Normal respiratory effort, no increased work of breathing. GI: Abdomen is soft, nontender, nondistended, no abdominal masses GU: No CVA tenderness.  Lymph: No cervical or inguinal lymphadenopathy. Skin: No rashes, bruises or suspicious lesions. Neurologic: Grossly intact, no focal deficits, moving all 4 extremities. Psychiatric: Normal mood and affect.  Laboratory Data: Lab Results  Component Value Date   WBC 8.9 08/11/2020   HGB 11.5 (L) 08/11/2020   HCT 36.4 (L) 08/11/2020   MCV 97.8 08/11/2020   PLT 336 08/11/2020    Lab Results  Component Value Date   CREATININE 2.87 (H) 08/11/2020    No results found for: "PSA"  No results found for: "TESTOSTERONE "  Lab Results  Component Value Date   HGBA1C 6.6 (H) 05/16/2019    Urinalysis    Component Value Date/Time   COLORURINE YELLOW 05/17/2019 1041   APPEARANCEUR Clear 01/07/2023 1000   LABSPEC 1.014 05/17/2019 1041   PHURINE 6.0 05/17/2019 1041   GLUCOSEU Negative 01/07/2023 1000   HGBUR LARGE (A) 05/17/2019 1041   BILIRUBINUR Negative 01/07/2023 1000   KETONESUR NEGATIVE 05/17/2019 1041   PROTEINUR Negative 01/07/2023 1000   PROTEINUR 30 (A) 05/17/2019 1041   UROBILINOGEN 0.2 11/07/2019 0954   NITRITE Negative 01/07/2023 1000   NITRITE NEGATIVE 05/17/2019 1041   LEUKOCYTESUR  Negative 01/07/2023 1000   LEUKOCYTESUR MODERATE (A) 05/17/2019 1041    Lab Results  Component Value Date   LABMICR Comment 01/07/2023   WBCUA 0-5 09/08/2020   LABEPIT None seen 09/08/2020   MUCUS Present 09/08/2020   BACTERIA None seen 09/08/2020    Pertinent Imaging: KUB today: Images reviewed and discussed with the patient  Results for orders placed during the hospital encounter of 01/07/23  Abdomen 1 view (KUB)  Narrative CLINICAL DATA:  Nephrolithiasis  EXAM: ABDOMEN - 1 VIEW  COMPARISON:  01/06/2022  FINDINGS: Nonobstructive bowel gas pattern.  No definite urinary tract calcification.  Vascular calcifications in pelvis.  Degenerative changes lumbar spine.  No acute osseous findings.  IMPRESSION: No urinary tract calcifications identified.   Electronically Signed By: Wynelle Heather M.D. On: 01/08/2023 13:08  No results found for this or any previous visit.  No results found for this or any previous visit.  No results  found for this or any previous visit.  Results for orders placed during the hospital encounter of 09/03/20  US  RENAL  Narrative CLINICAL DATA:  Acute kidney failure  EXAM: RENAL / URINARY TRACT ULTRASOUND COMPLETE  COMPARISON:  08/01/2020, 02/04/2020, CT 11/02/2019  FINDINGS: Right Kidney:  Renal measurements: 14.2 x 6.2 x 6.8 cm = volume: 335.2 mL. Echogenicity within normal limits. There is moderate right hydronephrosis which is similar to 08/01/2020, but increased compared with 02/04/2020. 1.6 cm shadowing echogenic focus lower pole right kidney likely representing a kidney stone.  Left Kidney:  Renal measurements: 13.8 x 6.7 x 5.8 cm = volume: 276.3 mL. Cortical echogenicity is normal. No hydronephrosis. 1.8 cm shadowing echogenic focus lower pole left kidney, likely representing a stone.  Bladder:  Appears normal for degree of bladder distention.  Other:  None.  IMPRESSION: 1. Bilateral intrarenal calculi.  Moderate right hydronephrosis, suggest correlation with CT KUB to assess for distal obstruction. 2. Negative for left hydronephrosis   Electronically Signed By: Esmeralda Hedge M.D. On: 09/03/2020 15:20  No results found for this or any previous visit.  No results found for this or any previous visit.  Results for orders placed in visit on 10/23/20  CT RENAL STONE STUDY  Narrative CLINICAL DATA:  Flank pain.  Nephrolithiasis.  EXAM: CT ABDOMEN AND PELVIS WITHOUT CONTRAST  TECHNIQUE: Multidetector CT imaging of the abdomen and pelvis was performed following the standard protocol without IV contrast.  COMPARISON:  11/02/2019  FINDINGS: Lower chest: Ground-glass opacity in the posterior left lower lobe is incompletely visualized on this study. It measures at least 2.8 x 2.0 cm and has increased in size since previous study.  Hepatobiliary: No mass visualized on this unenhanced exam. Gallbladder is unremarkable. No evidence of biliary ductal dilatation.  Pancreas: No mass or inflammatory process visualized on this unenhanced exam.  Spleen:  Within normal limits in size.  Adrenals/Urinary tract: Small renal calculi are again seen bilaterally, largest in the lower pole of the left kidney measuring 11 mm. No evidence of ureteral calculi or hydronephrosis. Unremarkable unopacified urinary bladder.  Stomach/Bowel: No evidence of obstruction, inflammatory process, or abnormal fluid collections. Normal appendix visualized. Diffuse colonic diverticulosis is again seen, without evidence of diverticulitis.  Vascular/Lymphatic: No pathologically enlarged lymph nodes identified. No evidence of abdominal aortic aneurysm. Aortic atherosclerotic calcification noted.  Reproductive:  No mass or other significant abnormality.  Other:  None.  Musculoskeletal:  No suspicious bone lesions identified.  IMPRESSION: Bilateral nephrolithiasis. No evidence of ureteral calculi  or hydronephrosis.  Colonic diverticulosis. No radiographic evidence of diverticulitis.  Incompletely visualized ground-glass opacity in posterior left lower lobe, with increased size since previous study. Differential diagnosis includes inflammatory or infectious etiologies, and low-grade adenocarcinoma. Recommend short-term follow-up by chest CT without contrast in 3 months.  Aortic Atherosclerosis (ICD10-I70.0).   Electronically Signed By: Marlyce Sine M.D. On: 10/23/2020 15:59   Assessment & Plan:    1. Nephrolithiasis (Primary) Followup 1 year with KUB - DG Abd 1 View  2. Nocturia Tadalafil  5mg  daily  3. Benign prostatic hyperplasia without lower urinary tract symptoms Tadalafil  5mg  daily  4. Erectile dysfunction due to arterial insufficiency Tadalafil  20mg  prn   No follow-ups on file.  Johnie Nailer, MD  [facility] [place]

## 2024-01-17 ENCOUNTER — Encounter: Payer: Self-pay | Admitting: Urology

## 2024-01-17 NOTE — Patient Instructions (Signed)

## 2024-01-18 DIAGNOSIS — E1122 Type 2 diabetes mellitus with diabetic chronic kidney disease: Secondary | ICD-10-CM | POA: Diagnosis not present

## 2024-01-18 DIAGNOSIS — I10 Essential (primary) hypertension: Secondary | ICD-10-CM | POA: Diagnosis not present

## 2024-01-18 DIAGNOSIS — E875 Hyperkalemia: Secondary | ICD-10-CM | POA: Diagnosis not present

## 2024-01-25 DIAGNOSIS — N1831 Chronic kidney disease, stage 3a: Secondary | ICD-10-CM | POA: Diagnosis not present

## 2024-01-25 DIAGNOSIS — R945 Abnormal results of liver function studies: Secondary | ICD-10-CM | POA: Diagnosis not present

## 2024-01-25 DIAGNOSIS — E1122 Type 2 diabetes mellitus with diabetic chronic kidney disease: Secondary | ICD-10-CM | POA: Diagnosis not present

## 2024-01-25 DIAGNOSIS — Z1389 Encounter for screening for other disorder: Secondary | ICD-10-CM | POA: Diagnosis not present

## 2024-01-25 DIAGNOSIS — E875 Hyperkalemia: Secondary | ICD-10-CM | POA: Diagnosis not present

## 2024-01-25 DIAGNOSIS — N23 Unspecified renal colic: Secondary | ICD-10-CM | POA: Diagnosis not present

## 2024-01-25 DIAGNOSIS — Z0001 Encounter for general adult medical examination with abnormal findings: Secondary | ICD-10-CM | POA: Diagnosis not present

## 2024-01-25 DIAGNOSIS — R5383 Other fatigue: Secondary | ICD-10-CM | POA: Diagnosis not present

## 2024-01-25 DIAGNOSIS — E785 Hyperlipidemia, unspecified: Secondary | ICD-10-CM | POA: Diagnosis not present

## 2024-01-25 DIAGNOSIS — N200 Calculus of kidney: Secondary | ICD-10-CM | POA: Diagnosis not present

## 2024-01-25 DIAGNOSIS — N179 Acute kidney failure, unspecified: Secondary | ICD-10-CM | POA: Diagnosis not present

## 2024-01-25 DIAGNOSIS — I10 Essential (primary) hypertension: Secondary | ICD-10-CM | POA: Diagnosis not present

## 2024-01-30 DIAGNOSIS — I129 Hypertensive chronic kidney disease with stage 1 through stage 4 chronic kidney disease, or unspecified chronic kidney disease: Secondary | ICD-10-CM | POA: Diagnosis not present

## 2024-01-30 DIAGNOSIS — N200 Calculus of kidney: Secondary | ICD-10-CM | POA: Diagnosis not present

## 2024-01-30 DIAGNOSIS — N1831 Chronic kidney disease, stage 3a: Secondary | ICD-10-CM | POA: Diagnosis not present

## 2024-01-30 DIAGNOSIS — R809 Proteinuria, unspecified: Secondary | ICD-10-CM | POA: Diagnosis not present

## 2024-02-03 DIAGNOSIS — H40033 Anatomical narrow angle, bilateral: Secondary | ICD-10-CM | POA: Diagnosis not present

## 2024-02-03 DIAGNOSIS — H18513 Endothelial corneal dystrophy, bilateral: Secondary | ICD-10-CM | POA: Diagnosis not present

## 2024-02-03 DIAGNOSIS — H35351 Cystoid macular degeneration, right eye: Secondary | ICD-10-CM | POA: Diagnosis not present

## 2024-02-03 DIAGNOSIS — H2513 Age-related nuclear cataract, bilateral: Secondary | ICD-10-CM | POA: Diagnosis not present

## 2024-02-06 DIAGNOSIS — L304 Erythema intertrigo: Secondary | ICD-10-CM | POA: Diagnosis not present

## 2024-02-06 DIAGNOSIS — B078 Other viral warts: Secondary | ICD-10-CM | POA: Diagnosis not present

## 2024-02-17 DIAGNOSIS — I10 Essential (primary) hypertension: Secondary | ICD-10-CM | POA: Diagnosis not present

## 2024-02-17 DIAGNOSIS — E875 Hyperkalemia: Secondary | ICD-10-CM | POA: Diagnosis not present

## 2024-02-17 DIAGNOSIS — E1122 Type 2 diabetes mellitus with diabetic chronic kidney disease: Secondary | ICD-10-CM | POA: Diagnosis not present

## 2024-02-20 DIAGNOSIS — A630 Anogenital (venereal) warts: Secondary | ICD-10-CM | POA: Diagnosis not present

## 2024-02-22 DIAGNOSIS — H40031 Anatomical narrow angle, right eye: Secondary | ICD-10-CM | POA: Diagnosis not present

## 2024-02-29 DIAGNOSIS — H40032 Anatomical narrow angle, left eye: Secondary | ICD-10-CM | POA: Diagnosis not present

## 2024-03-13 DIAGNOSIS — A630 Anogenital (venereal) warts: Secondary | ICD-10-CM | POA: Diagnosis not present

## 2024-03-19 DIAGNOSIS — E1122 Type 2 diabetes mellitus with diabetic chronic kidney disease: Secondary | ICD-10-CM | POA: Diagnosis not present

## 2024-03-19 DIAGNOSIS — E875 Hyperkalemia: Secondary | ICD-10-CM | POA: Diagnosis not present

## 2024-03-19 DIAGNOSIS — I10 Essential (primary) hypertension: Secondary | ICD-10-CM | POA: Diagnosis not present

## 2024-03-22 DIAGNOSIS — H18513 Endothelial corneal dystrophy, bilateral: Secondary | ICD-10-CM | POA: Diagnosis not present

## 2024-03-22 DIAGNOSIS — H2513 Age-related nuclear cataract, bilateral: Secondary | ICD-10-CM | POA: Diagnosis not present

## 2024-03-22 DIAGNOSIS — H524 Presbyopia: Secondary | ICD-10-CM | POA: Diagnosis not present

## 2024-03-22 DIAGNOSIS — H35351 Cystoid macular degeneration, right eye: Secondary | ICD-10-CM | POA: Diagnosis not present

## 2024-04-03 DIAGNOSIS — A630 Anogenital (venereal) warts: Secondary | ICD-10-CM | POA: Diagnosis not present

## 2024-04-19 DIAGNOSIS — E1122 Type 2 diabetes mellitus with diabetic chronic kidney disease: Secondary | ICD-10-CM | POA: Diagnosis not present

## 2024-04-19 DIAGNOSIS — E875 Hyperkalemia: Secondary | ICD-10-CM | POA: Diagnosis not present

## 2024-04-19 DIAGNOSIS — I10 Essential (primary) hypertension: Secondary | ICD-10-CM | POA: Diagnosis not present

## 2024-04-24 DIAGNOSIS — A630 Anogenital (venereal) warts: Secondary | ICD-10-CM | POA: Diagnosis not present

## 2024-04-24 DIAGNOSIS — C44519 Basal cell carcinoma of skin of other part of trunk: Secondary | ICD-10-CM | POA: Diagnosis not present

## 2024-05-02 DIAGNOSIS — H2513 Age-related nuclear cataract, bilateral: Secondary | ICD-10-CM | POA: Diagnosis not present

## 2024-05-02 DIAGNOSIS — H40039 Anatomical narrow angle, unspecified eye: Secondary | ICD-10-CM | POA: Diagnosis not present

## 2024-05-02 DIAGNOSIS — H18513 Endothelial corneal dystrophy, bilateral: Secondary | ICD-10-CM | POA: Diagnosis not present

## 2024-05-15 DIAGNOSIS — A630 Anogenital (venereal) warts: Secondary | ICD-10-CM | POA: Diagnosis not present

## 2024-05-15 DIAGNOSIS — L718 Other rosacea: Secondary | ICD-10-CM | POA: Diagnosis not present

## 2024-05-18 DIAGNOSIS — I10 Essential (primary) hypertension: Secondary | ICD-10-CM | POA: Diagnosis not present

## 2024-05-18 DIAGNOSIS — E1122 Type 2 diabetes mellitus with diabetic chronic kidney disease: Secondary | ICD-10-CM | POA: Diagnosis not present

## 2024-05-18 DIAGNOSIS — E875 Hyperkalemia: Secondary | ICD-10-CM | POA: Diagnosis not present

## 2024-05-25 DIAGNOSIS — H18513 Endothelial corneal dystrophy, bilateral: Secondary | ICD-10-CM | POA: Diagnosis not present

## 2024-05-25 DIAGNOSIS — Z01818 Encounter for other preprocedural examination: Secondary | ICD-10-CM | POA: Diagnosis not present

## 2024-05-25 DIAGNOSIS — H2513 Age-related nuclear cataract, bilateral: Secondary | ICD-10-CM | POA: Diagnosis not present

## 2024-06-12 DIAGNOSIS — N1831 Chronic kidney disease, stage 3a: Secondary | ICD-10-CM | POA: Diagnosis not present

## 2024-06-12 DIAGNOSIS — D638 Anemia in other chronic diseases classified elsewhere: Secondary | ICD-10-CM | POA: Diagnosis not present

## 2024-06-12 DIAGNOSIS — E875 Hyperkalemia: Secondary | ICD-10-CM | POA: Diagnosis not present

## 2024-06-12 DIAGNOSIS — A630 Anogenital (venereal) warts: Secondary | ICD-10-CM | POA: Diagnosis not present

## 2024-06-12 DIAGNOSIS — Z08 Encounter for follow-up examination after completed treatment for malignant neoplasm: Secondary | ICD-10-CM | POA: Diagnosis not present

## 2024-06-12 DIAGNOSIS — Z85828 Personal history of other malignant neoplasm of skin: Secondary | ICD-10-CM | POA: Diagnosis not present

## 2024-06-12 DIAGNOSIS — E1122 Type 2 diabetes mellitus with diabetic chronic kidney disease: Secondary | ICD-10-CM | POA: Diagnosis not present

## 2024-06-14 DIAGNOSIS — N400 Enlarged prostate without lower urinary tract symptoms: Secondary | ICD-10-CM | POA: Diagnosis not present

## 2024-06-14 DIAGNOSIS — I25810 Atherosclerosis of coronary artery bypass graft(s) without angina pectoris: Secondary | ICD-10-CM | POA: Diagnosis not present

## 2024-06-14 DIAGNOSIS — E119 Type 2 diabetes mellitus without complications: Secondary | ICD-10-CM | POA: Diagnosis not present

## 2024-06-14 DIAGNOSIS — I10 Essential (primary) hypertension: Secondary | ICD-10-CM | POA: Diagnosis not present

## 2024-06-14 DIAGNOSIS — H259 Unspecified age-related cataract: Secondary | ICD-10-CM | POA: Diagnosis not present

## 2024-06-14 DIAGNOSIS — Z8639 Personal history of other endocrine, nutritional and metabolic disease: Secondary | ICD-10-CM | POA: Diagnosis not present

## 2024-06-14 DIAGNOSIS — N1832 Chronic kidney disease, stage 3b: Secondary | ICD-10-CM | POA: Diagnosis not present

## 2024-06-14 DIAGNOSIS — E782 Mixed hyperlipidemia: Secondary | ICD-10-CM | POA: Diagnosis not present

## 2024-06-14 DIAGNOSIS — H18519 Endothelial corneal dystrophy, unspecified eye: Secondary | ICD-10-CM | POA: Diagnosis not present

## 2024-06-18 DIAGNOSIS — I739 Peripheral vascular disease, unspecified: Secondary | ICD-10-CM | POA: Diagnosis not present

## 2024-06-18 DIAGNOSIS — E1122 Type 2 diabetes mellitus with diabetic chronic kidney disease: Secondary | ICD-10-CM | POA: Diagnosis not present

## 2024-06-18 DIAGNOSIS — I10 Essential (primary) hypertension: Secondary | ICD-10-CM | POA: Diagnosis not present

## 2024-06-18 DIAGNOSIS — Z6827 Body mass index (BMI) 27.0-27.9, adult: Secondary | ICD-10-CM | POA: Diagnosis not present

## 2024-06-18 DIAGNOSIS — E875 Hyperkalemia: Secondary | ICD-10-CM | POA: Diagnosis not present

## 2024-06-19 DIAGNOSIS — E875 Hyperkalemia: Secondary | ICD-10-CM | POA: Diagnosis not present

## 2024-06-19 DIAGNOSIS — E1122 Type 2 diabetes mellitus with diabetic chronic kidney disease: Secondary | ICD-10-CM | POA: Diagnosis not present

## 2024-06-19 DIAGNOSIS — I10 Essential (primary) hypertension: Secondary | ICD-10-CM | POA: Diagnosis not present

## 2024-06-21 DIAGNOSIS — Z87891 Personal history of nicotine dependence: Secondary | ICD-10-CM | POA: Diagnosis not present

## 2024-06-21 DIAGNOSIS — I2510 Atherosclerotic heart disease of native coronary artery without angina pectoris: Secondary | ICD-10-CM | POA: Diagnosis not present

## 2024-06-21 DIAGNOSIS — I129 Hypertensive chronic kidney disease with stage 1 through stage 4 chronic kidney disease, or unspecified chronic kidney disease: Secondary | ICD-10-CM | POA: Diagnosis not present

## 2024-06-21 DIAGNOSIS — H2511 Age-related nuclear cataract, right eye: Secondary | ICD-10-CM | POA: Diagnosis not present

## 2024-06-21 DIAGNOSIS — E1136 Type 2 diabetes mellitus with diabetic cataract: Secondary | ICD-10-CM | POA: Diagnosis not present

## 2024-06-21 DIAGNOSIS — N1832 Chronic kidney disease, stage 3b: Secondary | ICD-10-CM | POA: Diagnosis not present

## 2024-06-21 DIAGNOSIS — M1990 Unspecified osteoarthritis, unspecified site: Secondary | ICD-10-CM | POA: Diagnosis not present

## 2024-06-21 DIAGNOSIS — K219 Gastro-esophageal reflux disease without esophagitis: Secondary | ICD-10-CM | POA: Diagnosis not present

## 2024-06-21 DIAGNOSIS — H18511 Endothelial corneal dystrophy, right eye: Secondary | ICD-10-CM | POA: Diagnosis not present

## 2024-06-21 DIAGNOSIS — E1122 Type 2 diabetes mellitus with diabetic chronic kidney disease: Secondary | ICD-10-CM | POA: Diagnosis not present

## 2024-06-22 DIAGNOSIS — Z947 Corneal transplant status: Secondary | ICD-10-CM | POA: Diagnosis not present

## 2024-06-22 DIAGNOSIS — H2512 Age-related nuclear cataract, left eye: Secondary | ICD-10-CM | POA: Diagnosis not present

## 2024-06-22 DIAGNOSIS — Z961 Presence of intraocular lens: Secondary | ICD-10-CM | POA: Diagnosis not present

## 2024-06-22 DIAGNOSIS — H18512 Endothelial corneal dystrophy, left eye: Secondary | ICD-10-CM | POA: Diagnosis not present

## 2024-06-22 DIAGNOSIS — Z9841 Cataract extraction status, right eye: Secondary | ICD-10-CM | POA: Diagnosis not present

## 2024-06-22 DIAGNOSIS — H40039 Anatomical narrow angle, unspecified eye: Secondary | ICD-10-CM | POA: Diagnosis not present

## 2024-06-25 DIAGNOSIS — R1013 Epigastric pain: Secondary | ICD-10-CM | POA: Diagnosis not present

## 2024-06-25 DIAGNOSIS — Z9841 Cataract extraction status, right eye: Secondary | ICD-10-CM | POA: Diagnosis not present

## 2024-06-25 DIAGNOSIS — H18512 Endothelial corneal dystrophy, left eye: Secondary | ICD-10-CM | POA: Diagnosis not present

## 2024-06-25 DIAGNOSIS — Z947 Corneal transplant status: Secondary | ICD-10-CM | POA: Diagnosis not present

## 2024-06-25 DIAGNOSIS — H40039 Anatomical narrow angle, unspecified eye: Secondary | ICD-10-CM | POA: Diagnosis not present

## 2024-06-25 DIAGNOSIS — Z961 Presence of intraocular lens: Secondary | ICD-10-CM | POA: Diagnosis not present

## 2024-06-25 DIAGNOSIS — Z6826 Body mass index (BMI) 26.0-26.9, adult: Secondary | ICD-10-CM | POA: Diagnosis not present

## 2024-06-25 DIAGNOSIS — H2512 Age-related nuclear cataract, left eye: Secondary | ICD-10-CM | POA: Diagnosis not present

## 2024-06-25 DIAGNOSIS — Z20828 Contact with and (suspected) exposure to other viral communicable diseases: Secondary | ICD-10-CM | POA: Diagnosis not present

## 2024-06-26 DIAGNOSIS — Z79899 Other long term (current) drug therapy: Secondary | ICD-10-CM | POA: Diagnosis not present

## 2024-06-26 DIAGNOSIS — I2510 Atherosclerotic heart disease of native coronary artery without angina pectoris: Secondary | ICD-10-CM | POA: Diagnosis not present

## 2024-06-26 DIAGNOSIS — I129 Hypertensive chronic kidney disease with stage 1 through stage 4 chronic kidney disease, or unspecified chronic kidney disease: Secondary | ICD-10-CM | POA: Diagnosis not present

## 2024-06-26 DIAGNOSIS — E7800 Pure hypercholesterolemia, unspecified: Secondary | ICD-10-CM | POA: Diagnosis not present

## 2024-06-26 DIAGNOSIS — R1013 Epigastric pain: Secondary | ICD-10-CM | POA: Diagnosis not present

## 2024-06-26 DIAGNOSIS — Z87891 Personal history of nicotine dependence: Secondary | ICD-10-CM | POA: Diagnosis not present

## 2024-06-26 DIAGNOSIS — N1832 Chronic kidney disease, stage 3b: Secondary | ICD-10-CM | POA: Diagnosis not present

## 2024-06-26 DIAGNOSIS — Z7984 Long term (current) use of oral hypoglycemic drugs: Secondary | ICD-10-CM | POA: Diagnosis not present

## 2024-06-26 DIAGNOSIS — K2900 Acute gastritis without bleeding: Secondary | ICD-10-CM | POA: Diagnosis not present

## 2024-06-26 DIAGNOSIS — K219 Gastro-esophageal reflux disease without esophagitis: Secondary | ICD-10-CM | POA: Diagnosis not present

## 2024-06-26 DIAGNOSIS — R109 Unspecified abdominal pain: Secondary | ICD-10-CM | POA: Diagnosis not present

## 2024-06-26 DIAGNOSIS — E1122 Type 2 diabetes mellitus with diabetic chronic kidney disease: Secondary | ICD-10-CM | POA: Diagnosis not present

## 2024-06-26 DIAGNOSIS — Z7982 Long term (current) use of aspirin: Secondary | ICD-10-CM | POA: Diagnosis not present

## 2024-06-26 DIAGNOSIS — R1010 Upper abdominal pain, unspecified: Secondary | ICD-10-CM | POA: Diagnosis not present

## 2024-06-27 DIAGNOSIS — R1013 Epigastric pain: Secondary | ICD-10-CM | POA: Diagnosis not present

## 2024-06-27 DIAGNOSIS — Z6826 Body mass index (BMI) 26.0-26.9, adult: Secondary | ICD-10-CM | POA: Diagnosis not present

## 2024-06-27 DIAGNOSIS — K2970 Gastritis, unspecified, without bleeding: Secondary | ICD-10-CM | POA: Diagnosis not present

## 2024-07-09 DIAGNOSIS — M75101 Unspecified rotator cuff tear or rupture of right shoulder, not specified as traumatic: Secondary | ICD-10-CM | POA: Diagnosis not present

## 2024-07-09 DIAGNOSIS — M19011 Primary osteoarthritis, right shoulder: Secondary | ICD-10-CM | POA: Diagnosis not present

## 2024-07-09 DIAGNOSIS — M25511 Pain in right shoulder: Secondary | ICD-10-CM | POA: Diagnosis not present

## 2024-07-17 DIAGNOSIS — A630 Anogenital (venereal) warts: Secondary | ICD-10-CM | POA: Diagnosis not present

## 2024-07-20 DIAGNOSIS — E1122 Type 2 diabetes mellitus with diabetic chronic kidney disease: Secondary | ICD-10-CM | POA: Diagnosis not present

## 2024-07-20 DIAGNOSIS — E875 Hyperkalemia: Secondary | ICD-10-CM | POA: Diagnosis not present

## 2024-07-20 DIAGNOSIS — I10 Essential (primary) hypertension: Secondary | ICD-10-CM | POA: Diagnosis not present

## 2024-07-23 DIAGNOSIS — E559 Vitamin D deficiency, unspecified: Secondary | ICD-10-CM | POA: Diagnosis not present

## 2024-07-23 DIAGNOSIS — E119 Type 2 diabetes mellitus without complications: Secondary | ICD-10-CM | POA: Diagnosis not present

## 2024-07-23 DIAGNOSIS — I10 Essential (primary) hypertension: Secondary | ICD-10-CM | POA: Diagnosis not present

## 2024-07-23 DIAGNOSIS — D631 Anemia in chronic kidney disease: Secondary | ICD-10-CM | POA: Diagnosis not present

## 2024-07-23 DIAGNOSIS — R809 Proteinuria, unspecified: Secondary | ICD-10-CM | POA: Diagnosis not present

## 2024-07-23 DIAGNOSIS — N189 Chronic kidney disease, unspecified: Secondary | ICD-10-CM | POA: Diagnosis not present

## 2024-07-23 DIAGNOSIS — R5383 Other fatigue: Secondary | ICD-10-CM | POA: Diagnosis not present

## 2024-07-25 DIAGNOSIS — M19011 Primary osteoarthritis, right shoulder: Secondary | ICD-10-CM | POA: Diagnosis not present

## 2024-07-25 DIAGNOSIS — M47812 Spondylosis without myelopathy or radiculopathy, cervical region: Secondary | ICD-10-CM | POA: Diagnosis not present

## 2024-07-25 DIAGNOSIS — M542 Cervicalgia: Secondary | ICD-10-CM | POA: Diagnosis not present

## 2024-08-07 DIAGNOSIS — A630 Anogenital (venereal) warts: Secondary | ICD-10-CM | POA: Diagnosis not present

## 2024-08-20 ENCOUNTER — Encounter: Payer: Self-pay | Admitting: Neurology

## 2024-08-22 ENCOUNTER — Other Ambulatory Visit: Payer: Self-pay

## 2024-08-22 DIAGNOSIS — R202 Paresthesia of skin: Secondary | ICD-10-CM

## 2024-08-27 DIAGNOSIS — N1831 Chronic kidney disease, stage 3a: Secondary | ICD-10-CM | POA: Diagnosis not present

## 2024-08-27 DIAGNOSIS — I129 Hypertensive chronic kidney disease with stage 1 through stage 4 chronic kidney disease, or unspecified chronic kidney disease: Secondary | ICD-10-CM | POA: Diagnosis not present

## 2024-08-27 DIAGNOSIS — R809 Proteinuria, unspecified: Secondary | ICD-10-CM | POA: Diagnosis not present

## 2024-08-27 DIAGNOSIS — E1122 Type 2 diabetes mellitus with diabetic chronic kidney disease: Secondary | ICD-10-CM | POA: Diagnosis not present

## 2024-10-26 ENCOUNTER — Encounter: Admitting: Neurology

## 2024-12-13 ENCOUNTER — Encounter: Admitting: Neurology

## 2025-01-09 ENCOUNTER — Ambulatory Visit: Admitting: Urology
